# Patient Record
Sex: Male | Born: 1945 | Race: White | Hispanic: No | Smoking: Current every day smoker
Health system: Southern US, Community
[De-identification: ages and names within clinical notes are randomized; demographics above are authoritative.]

## PROBLEM LIST (undated history)

## (undated) DIAGNOSIS — L0390 Cellulitis, unspecified: Secondary | ICD-10-CM

## (undated) DIAGNOSIS — I714 Abdominal aortic aneurysm, without rupture, unspecified: Secondary | ICD-10-CM

## (undated) DIAGNOSIS — J449 Chronic obstructive pulmonary disease, unspecified: Secondary | ICD-10-CM

## (undated) DIAGNOSIS — K439 Ventral hernia without obstruction or gangrene: Secondary | ICD-10-CM

## (undated) DIAGNOSIS — K219 Gastro-esophageal reflux disease without esophagitis: Secondary | ICD-10-CM

## (undated) DIAGNOSIS — C321 Malignant neoplasm of supraglottis: Principal | ICD-10-CM

## (undated) DIAGNOSIS — C801 Malignant (primary) neoplasm, unspecified: Secondary | ICD-10-CM

## (undated) DIAGNOSIS — G5600 Carpal tunnel syndrome, unspecified upper limb: Secondary | ICD-10-CM

## (undated) DIAGNOSIS — F419 Anxiety disorder, unspecified: Secondary | ICD-10-CM

## (undated) DIAGNOSIS — I10 Essential (primary) hypertension: Secondary | ICD-10-CM

## (undated) DIAGNOSIS — E785 Hyperlipidemia, unspecified: Secondary | ICD-10-CM

## (undated) HISTORY — DX: Malignant neoplasm of supraglottis: C32.1

## (undated) HISTORY — DX: Chronic obstructive pulmonary disease, unspecified: J44.9

## (undated) HISTORY — DX: Abdominal aortic aneurysm, without rupture: I71.4

## (undated) HISTORY — DX: Cellulitis, unspecified: L03.90

## (undated) HISTORY — PX: APPENDECTOMY: SHX54

## (undated) HISTORY — DX: Abdominal aortic aneurysm, without rupture, unspecified: I71.40

## (undated) HISTORY — DX: Essential (primary) hypertension: I10

## (undated) HISTORY — DX: Hyperlipidemia, unspecified: E78.5

## (undated) HISTORY — DX: Carpal tunnel syndrome, unspecified upper limb: G56.00

## (undated) HISTORY — PX: HEMORROIDECTOMY: SUR656

## (undated) HISTORY — DX: Ventral hernia without obstruction or gangrene: K43.9

---

## 2006-09-26 HISTORY — PX: VASCULAR SURGERY: SHX849

## 2007-07-19 ENCOUNTER — Ambulatory Visit: Payer: Self-pay | Admitting: *Deleted

## 2007-07-30 ENCOUNTER — Ambulatory Visit (HOSPITAL_COMMUNITY): Admission: RE | Admit: 2007-07-30 | Discharge: 2007-07-30 | Payer: Self-pay | Admitting: *Deleted

## 2007-07-30 ENCOUNTER — Ambulatory Visit: Payer: Self-pay | Admitting: *Deleted

## 2007-09-05 ENCOUNTER — Inpatient Hospital Stay (HOSPITAL_COMMUNITY): Admission: RE | Admit: 2007-09-05 | Discharge: 2007-09-07 | Payer: Self-pay | Admitting: *Deleted

## 2007-09-05 ENCOUNTER — Ambulatory Visit: Payer: Self-pay | Admitting: *Deleted

## 2007-09-10 ENCOUNTER — Ambulatory Visit: Payer: Self-pay | Admitting: *Deleted

## 2007-10-11 ENCOUNTER — Ambulatory Visit: Payer: Self-pay | Admitting: *Deleted

## 2007-11-08 ENCOUNTER — Encounter: Admission: RE | Admit: 2007-11-08 | Discharge: 2007-11-08 | Payer: Self-pay | Admitting: *Deleted

## 2007-11-08 ENCOUNTER — Ambulatory Visit: Payer: Self-pay | Admitting: *Deleted

## 2007-11-15 ENCOUNTER — Ambulatory Visit: Payer: Self-pay | Admitting: *Deleted

## 2008-03-06 ENCOUNTER — Ambulatory Visit: Payer: Self-pay | Admitting: *Deleted

## 2008-05-05 ENCOUNTER — Encounter: Admission: RE | Admit: 2008-05-05 | Discharge: 2008-05-05 | Payer: Self-pay | Admitting: Neurosurgery

## 2008-06-12 ENCOUNTER — Encounter: Admission: RE | Admit: 2008-06-12 | Discharge: 2008-06-12 | Payer: Self-pay | Admitting: *Deleted

## 2008-06-12 ENCOUNTER — Ambulatory Visit: Payer: Self-pay | Admitting: *Deleted

## 2010-10-17 ENCOUNTER — Encounter: Payer: Self-pay | Admitting: *Deleted

## 2011-02-08 NOTE — Assessment & Plan Note (Signed)
OFFICE VISIT   Bruce [name], Bruce [name]  DOB:  11-26-1945                                       06/12/2008  CHART#:[id number]   The patient had an aortic stent placed for [name] small infrarenal aortic  aneurysm and [name] large right common iliac aneurysm on 09/05/2007 at [facility].  He did well following the surgery with the exception of  some claudication in his right buttock associated with coil embolization  of his right hypogastric artery.   These symptoms have been improving.   He underwent [name] CT scan at this time which reveals good position of the  stent without evidence of endoleak and thrombosis of the right common  iliac artery aneurysm.   The patient appears well.  No acute distress.  BP 133/87, pulse 102 per  minute and regular, respirations are 18 per minute.  His abdomen is  soft, nontender.  No masses or organomegaly.  Intact femoral pulses  bilaterally.   The patient continues to do well following the aortic stent procedure  and treatment of right iliac aneurysm.  Will plan followup again in 6  months with [name] repeat CT scan.   Balinda Quails, M.D.  Electronically Signed   PGH/MEDQ  D:  06/12/2008  T:  06/14/2008  Job:  [id number]   cc:   Doreen Beam, MD

## 2011-02-08 NOTE — Discharge Summary (Signed)
Bruce [name], [name]                ACCOUNT NO.:  [id number]   MEDICAL RECORD NO.:  [id number]          PATIENT TYPE:  INP   LOCATION:  2010                         FACILITY:  [facility]   PHYSICIAN:  Balinda Quails, M.D.    DATE OF BIRTH:  [birth year]   DATE OF ADMISSION:  09/05/2007  DATE OF DISCHARGE:  09/07/2007                               DISCHARGE SUMMARY   ADMISSION DIAGNOSES:  1. Aortic abdominal aneurysm.  2. Right common iliac artery aneurysm.   DISCHARGE DIAGNOSES:  1. Aortic abdominal aneurysm.  2. Right common iliac artery aneurysm.  3. Chronic obstructive pulmonary disease.  4. Epigastric hernia.  5. Hypertension.  6. Hyperlipidemia.  7. Recurrent cellulitis, right leg.  8. Carpal tunnel syndrome.   CONSULTATIONS:  None.   PROCEDURES:  1. Embolization of right hypogastric artery by Dr. Myra Gianotti IV.  2. Endologic power leak abdominal aortic aneurysm stent/graft done by      Dr. Madilyn Fireman.   BRIEF HISTORY AND PHYSICAL:  Bruce [name] is a 65 year old gentleman  referred for evaluation and management of an abdominal aortic aneurysm  and right common iliac artery aneurysm.  The patient was seen in  consultation in the office.  He denied symptoms.  No abdominal pain.  No  back pain.  He had recently undergone a workup including colonoscopy  revealing possible ischemic colitis.  CT angiogram revealed patency of  the superior mesenteric and inferior mesenteric arteries.  An abdominal  aortic aneurysm extending from the renal arteries down to the right  common iliac artery where there was a large 4.1-cm aneurysm.  Physical  examination while the patient was in the office was unremarkable.  The  patient was scheduled to undergo a stent/graft repair of a AAA and right  common iliac aneurysm for September 05, 2007.  This was discussed with  the patient and risks and procedure were also discussed with the  patient.  The patient understood and agreed to proceed and signed  consent.  The patient went through with the procedure on September 05, 2007, tolerated well with no complications.   HOSPITAL COURSE:  On September 05, 2007, the patient was taken to the OR  to proceed with procedure that had already been discussed with him and  scheduled electively.  The patient tolerated with no complications and  was transferred to PACU in stable condition.  From the PACU the patient  was then transferred to 3300 and remained stable overnight.  Post-op day #1, the patient's vitals were stable.  Labs within normal  range and afebrile.  The patient stated no complaints but not quite  ready for discharge that day.  On physical examination, the patient was  alert and oriented x3 in bed.  Femoral incisions bilaterally were clean  and dry with no hematoma and a plus 2 pulse distal.  Neurologically, the  patient was intact.  Plan at this time was to go ahead and discontinue  the Foley, central line, and intravenous fluids, normal saline  __________ .  The patient will be transferred to 2000 and start to  ambulate.  The patient agreed to this care.   On September 07, 2007, post-op day #2, the patient continued to remain  afebrile.  His vitals were stable.  There were no labs at this time.  After discussing with the patient he said he had no complaints and he  was ready to go home.   PHYSICAL EXAMINATION:  GENERAL:  The patient was alert and oriented x3.  HEART:  Normal rate and regular rhythm.  LUNGS:  Clear bilaterally.  ABDOMEN:  Soft and active bowel sounds. The patient also was eating and  tolerating food with no abdominal pain or nausea or vomiting.  NEUROLOGIC:  The patient was intact.  SKIN:  Bilateral femoral incisions were clean and dry with no hematoma.  No signs of infection and progressing towards healing.  Two positive  pedal pulses palpable.   We felt the patient could go home at this time and after discussion the  patient agreed that he was ready to go home.   The patient will be  discharged on September 07, 2007.   DISCHARGE MEDICATIONS:  1. Vytorin 10/20 daily.  2. Lotrel 5.10 daily.  3. Lorazepam 2 mg nightly.  4. Tylox 1-2 tablets orally every 6 hours as needed for pain.   DISCHARGE INSTRUCTIONS:  Discussed with the patient instructions and  followup care for discharge on September 07, 2007.  I explained to the  patient he may clean the wounds with soap and water.  I also discussed  with the patient to increase activity slowly, walk with assistance.  He  may shower and bathe.  No lifting or driving for 2 weeks.  Also, the  patient is not to return to work for 1 week.  I told the patient to  contact the office with any redness, drainage, swelling, abdominal pain,  or severe nausea vomiting.  I also instructed the patient to take the  meds per the reconciliation sheet along with Tylox 1-2 tablets orally  every four to six hours for pain as needed.  Also, discussed with the  patient he is to return to Dr. Madilyn Fireman in one month to have the  stent/graft  followup which includes a CT scan.  The patient has agreed  to these instructions and stated that he understood these instructions.   The plan is for the patient to be discharged to home today.  The patient  is currently afebrile and vitals are stable.      Cyndy Freeze, PA      P. Liliane Bade, M.D.  Electronically Signed    ALW/MEDQ  D:  09/07/2007  T:  09/07/2007  Job:  [id number]

## 2011-02-08 NOTE — Assessment & Plan Note (Signed)
OFFICE VISIT   Bruce [name], Bruce [name]  DOB:  Feb 05, 1946                                       10/11/2007  CHART#:[id number]   The patient underwent repair of abdominal aortic aneurysm and right  common iliac artery aneurysm with aortic stent graft carried out  09/05/2007 at [facility].  This required embolization of his  right hypogastric artery.   At this time, he is doing generally well.  He does note some right  buttock claudication, which is quite typical following this procedure.   BP is 121/78, pulse 112 per minute and regular, respirations 18 per  minute, O2 saturation 97%.  Right groin incision is well-healed.  Abdomen is soft and nontender.  2+ femoral pulses bilaterally.   The patient overall is doing well.  I have given him permission to  return to work as of 10/22/2007.  I reassured him that his right buttock  claudication will improve with time, and he should just stop and rest  when he feels this most significantly.   Balinda Quails, M.D.  Electronically Signed   PGH/MEDQ  D:  10/11/2007  T:  10/12/2007  Job:  [id number]   cc:   Doreen Beam, MD

## 2011-02-08 NOTE — Assessment & Plan Note (Signed)
OFFICE VISIT   SULEIMAN, FINIGAN A  DOB:  08-Jan-1946                                       11/08/2007  CHART#:[id number]   Patient is status post treatment of a large right common iliac artery  aneurysm with embolization of the right hypogastric artery and Endologix  Powerlink AAA stent graft on 09/05/07.  He has had complaints of right  buttock claudication following this.  He was seen as recently as  10/11/07 with this complaint.  Patient was reassured that he should  develop improvement with time.   He continues to have some discomfort, especially walking up inclines.  This is in his right buttock, relieved by rest.   PHYSICAL EXAMINATION:  BP is 118/72, pulse 112 per minute, respirations  18 per minute.  His abdominal exam is unremarkable.  He has 2+ femoral  pulses bilaterally.  No femoral bruits.  Right groin incision well  healed.   The plan at this time is to place him on some Pletal 100 mg b.i.d.  He  has no history of congestive heart failure.   Patient reassured once again and instructed to try a regular walking  program.  We will plan followup with his six month CT scan here in the  office.   Balinda Quails, M.D.  Electronically Signed   PGH/MEDQ  D:  11/08/2007  T:  11/10/2007  Job:  [id number]

## 2011-02-08 NOTE — Op Note (Signed)
NAMEJEREMIA, Bruce [name]                ACCOUNT NO.:  [id number]   MEDICAL RECORD NO.:  [id number]          PATIENT TYPE:  INP   LOCATION:  3311                         FACILITY:  [facility]   PHYSICIAN:  Juleen China IV, MDDATE OF BIRTH:  [birth year]   DATE OF PROCEDURE:  09/05/2007  DATE OF DISCHARGE:                               OPERATIVE REPORT   PREOPERATIVE DIAGNOSIS:  Abdominal and iliac aneurysm.   POSTOPERATIVE DIAGNOSIS:  Abdominal and iliac aneurysm.   PROCEDURES PERFORMED:  1. Ultrasound-guided access of the left common femoral artery.  2. Catheter in aorta.  3. Second-order cannulation (right hypogastric artery).  4. Coil of right hypogastric artery.   TYPE OF ANESTHESIA:  General.   BLOOD LOSS:  Minimal.   FINDINGS:  Successful coil embolization of the right hypogastric artery.   DRAINS:  None.   CULTURES:  None.   INDICATIONS:  This is a co-surgeon's dictation.  Please see Dr. Liliane Bade' dictation for the remaining portions of the procedure.   DESCRIPTION OF PROCEDURE:  The patient was identified in the holding  area and taken to room #9.  He was placed supine on the table.  General  endotracheal anesthesia was administered.  The patient was prepped and  draped in standard sterile fashion.  Time-out was called and  perioperative antibiotics were administered.  The left common femoral  artery was evaluated with ultrasound and found to be widely patent.  Using an ultrasound guidance, the left common femoral artery was then  accessed with an 18-gauge needle.  Next, a 0.035 wire was then advanced  in retrograde fashion into the abdominal aorta under fluoroscopic  visualization.  Next, a 9-French sheath was placed.  Using an Omni Flush  catheter and a Bentson wire, the aortic bifurcation was crossed with a  wire into the right common femoral artery.  The Omni Flux catheter was  removed and a 65-cm Kumpe catheter was placed into the right external  iliac artery.  It  was then withdrawn and the right hypogastric artery  was selected.  Bentson wire was used to advance the catheter into the  first branch of the right hypogastric artery.  The bifurcation of the  first branch was identified.  Two 10-mm Tornado coils were placed into  the right hypogastric artery.  A contrast injection was then performed,  which revealed successful coil embolization of the right hypogastric  artery.  At this point in time, please see Dr. Madilyn Fireman' note for the  remaining portions of the procedure.  Following successful endovascular  exclusion of the aneurysm, a retrograde right femoral artery injection  was performed, which delineated the location  of the arterial access.  This was found to be adequate for closure  device.  A StarClose was then deployed.  There was bleeding from the  closure site and therefore, manual pressure was held for 20 minutes.  There was no hematoma following removal of pressure.  The patient was  extubated and taken to the recovery room in stable condition.           ______________________________  Jorge Ny, MD  Electronically Signed     VWB/MEDQ  D:  09/05/2007  T:  09/05/2007  Job:  [id number]

## 2011-02-08 NOTE — Op Note (Signed)
[name], [name]                ACCOUNT NO.:  [id number]   MEDICAL RECORD NO.:  [id number]          PATIENT TYPE:  AMB   LOCATION:  SDS                          FACILITY:  [facility]   PHYSICIAN:  Balinda Quails, M.D.    DATE OF BIRTH:  July 02, 1946   DATE OF PROCEDURE:  07/30/2007  DATE OF DISCHARGE:                               OPERATIVE REPORT   DIAGNOSIS:  Right common iliac artery aneurysm.   PROCEDURE:  Abdominal aortogram with pelvic runoff arteriography.   ACCESS:  Right common femoral artery 5-French sheath.   CONTRAST:  80 mL of Visipaque.   COMPLICATIONS:  None apparent.   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  He is brought to the  cath lab at this time for further workup with diagnostic arteriography.   PROCEDURE NOTE:  The patient was brought to the cath lab in stable  condition.  Placed in the supine position.  Right groin prepped and  draped in a sterile fashion.  Skin and subcutaneous tissues instilled  with 1% Xylocaine.  A needle easily introduced into the right common  femoral artery.  A 0.035 guidewire passed through the needle into the  mid abdominal aorta.  A 5-French sheath advanced over the guidewire.  A  graduated pigtail catheter advanced over the guidewire to the mid  abdominal aorta.  Standard AP mid abdominal aortogram obtained.  This  revealed 2 right renal arteries and a single left renal artery.  All  renal arteries appeared to be widely patent.  The infrarenal aorta  revealed calcific plaque.  The aortic bifurcation also revealed  calcified plaque.  The left common iliac artery revealed mild plaque at  its origin.  The right common iliac artery was patent at its origin.  There appeared to be a small aneurysmal change in the proximal portion  of the right common iliac artery.  The midportion of  the right common  iliac artery revealed a saccular type aneurysm.  The hypogastric  arteries were patent bilaterally.  The external iliac artery was intact  down to the level of the common femoral arteries bilaterally.   A retrograde right femoral arteriogram was obtained for potential  placement of a closure device.  This however revealed the sheath to be  closely adjacent to the origin of bifid profunda.  Therefore, closure  device was not used.  The right femoral sheath was removed with pressure  on the right groin.   FINAL IMPRESSION:  1. Single left renal artery, 2 right renal arteries, all widely      patent.  2. 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  3. 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  4. Patent runoff to the common femoral arteries bilaterally.   DISPOSITION:  These results have been reviewed with the patient and  family.  He appears to be a candidate for potential stent-graft  placement for closure of right common iliac artery aneurysm.  This will  be further reviewed and planned as appropriate.      Balinda Quails, M.D.  Electronically Signed     PGH/MEDQ  D:  07/30/2007  T:  07/31/2007  Job:  [id number]

## 2011-02-08 NOTE — Assessment & Plan Note (Signed)
OFFICE VISIT   [name], [name] A  DOB:  [birth year]                                       11/15/2007  CHART#:[id number]   Patient return to the office today with a plain film result of his right  hip.  This reveals no major findings.   He was once again reassured today that the symptoms in his right hip  should improve as he continues to exercise.  I will plan followup as  scheduled in six months with a CT scan.   Balinda Quails, M.D.  Electronically Signed   PGH/MEDQ  D:  11/15/2007  T:  11/16/2007  Job:  [id number]

## 2011-02-08 NOTE — H&P (Signed)
NAMEGHASSAN, COGGESHALL                ACCOUNT NO.:  [id number]   MEDICAL RECORD NO.:  [id number]          PATIENT TYPE:  INP   LOCATION:  2550                         FACILITY:  [facility]   PHYSICIAN:  Balinda Quails, M.D.    DATE OF BIRTH:  [birth year]   DATE OF ADMISSION:  09/05/2007  DATE OF DISCHARGE:                              HISTORY & PHYSICAL   PRIMARY CARE PHYSICIAN:  Balinda Quails, M.D.   PRIMARY CARE PHYSICIAN:  Doreen Beam, MD, [facility].   ADMISSION DIAGNOSIS:  A 4.1 cm right common iliac artery aneurysm.   HISTORY:  Bruce [name] is a 65 year old gentleman referred for  evaluation and management of abdominal aortic aneurysm and right common  iliac artery aneurysm.  The patient was seen in consultation in the  office.  He denied symptoms.  No abdominal pain or back pain.   He had recently undergone workup including colonoscopy revealing  possible ischemic colitis.   CT angiogram revealed patency of the superior mesenteric and inferior  mesenteric arteries.  An abdominal aortic aneurysms was present  extending from the renal arteries down to the right common iliac artery  were there was a large 4.1 cm aneurysm.   PAST MEDICAL HISTORY:  1. COPD.  2. Epigastric hernia.  3. Hypertension.  4. Hyperlipidemia.  5. Recurrent cellulitis, right leg.  6. Carpal tunnel syndrome.   MEDICATIONS:  1. Vytorin 10/20 one tablet daily.  2. Lotrel 05/10 one tablet daily.  3. Lorazepam 1 mg nightly p.r.n.   ALLERGIES:  None known.   SOCIAL HISTORY:  The patient is married with two grown children.  He is  a Scientist, product/process development, currently unemployed.  Smokes five to six cigars  daily.  No alcohol use.   FAMILY HISTORY:  Mother died at age 17 of congestive heart failure.  Father died at age 24 with history of coronary disease, MI and stroke.  He has one brother deceased at age 44 of an MI.   REVIEW OF SYSTEMS:  The patient denies weight loss, anorexia, fever or  chills.   No cardiac, pulmonary, GI, GU, neurologic, musculoskeletal,  psychiatric, visual or hearing changes, no bleeding problems.   PHYSICAL EXAMINATION:  GENERAL:  A 65 year old male, alert and oriented.  No acute distress.  Appears stated age.  VITAL SIGNS:  Blood pressure 142/96, pulse 84 per minute, respirations  18 per minute, O2Sat 96%.  HEENT:  Mouth and throat clear.  Normocephalic.  Extraocular movements  intact.  Pupils equal and reactive.  NECK:  Supple.  No thyromegaly or adenopathy.  CARDIOVASCULAR:  No carotid bruits.  Normal heart sounds without  murmurs.  Regular rate and rhythm.  Intact femoral, popliteal, posterior  tibial, and dorsalis pedis pulses bilaterally.  No ankle edema.  RESPIRATORY:  Equal air entry bilaterally.  Distant breath sounds.  No  rales or rhonchi.  Normal percussion.  GI:  Abdomen soft and nontender.  No palpable AAA.  No organomegaly.  No  other masses felt.  Normal bowel sounds without bruits.  NEUROLOGIC:  The patient is alert and oriented.  No acute distress.  Cranial nerves intact.  Strength equal bilaterally.  MUSCULOSKELETAL:  Intact range of motion bilaterally.  No joint  deformity.  SKIN:  No rashes or ulcers.   IMPRESSION:  1. Small infrarenal abdominal aortic aneurysm, 4.1 cm right common      iliac artery aneurysm.  2. Hypertension.  3. Hyperlipidemia.  4. Chronic obstructive pulmonary disease.   RECOMMENDATION:  The patient will be scheduled to undergo a stent graft  repair of AAA and right common iliac aneurysm.      Balinda Quails, M.D.  Electronically Signed     PGH/MEDQ  D:  09/05/2007  T:  09/05/2007  Job:  [id number]   cc:   Doreen Beam

## 2011-02-08 NOTE — Op Note (Signed)
Bruce [name]                ACCOUNT NO.:  [id number]   MEDICAL RECORD NO.:  [id number]          PATIENT TYPE:  INP   LOCATION:  3311                         FACILITY:  [facility]   PHYSICIAN:  Balinda Quails, M.D.    DATE OF BIRTH:  1946/09/01   DATE OF PROCEDURE:  09/05/2007  DATE OF DISCHARGE:                               OPERATIVE REPORT   SURGEON:  Balinda Quails, M.D.   ASSISTANT:  1. Charlena Cross, M.D.   ANESTHESIA:  General endotracheal anesthesia.   ANESTHESIOLOGIST:  Janetta Hora. Gelene Mink, M.D.   PREOPERATIVE DIAGNOSIS:  1. 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  2. 3.7 cm infrarenal abdominal aortic aneurysm.   POSTOPERATIVE DIAGNOSIS:  1. 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  2. 3.7 cm infrarenal abdominal aortic aneurysm.   PROCEDURE:  1. Embolization of right hypogastric artery (dictated under separate      heading by Dr. Myra Gianotti).  2. Endologic Power Link abdominal aortic aneurysm stent graft.   FLUOROSCOPY:  5.39 minutes.   CONTRAST:  33 mL Visipaque.   COMPLICATIONS:  None apparent.   CLINICAL NOTE:  Bruce [name] is a 65 year old male with COPD and a large  right common iliac aneurysm greater than 4 cm.  A small infrarenal  abdominal aortic aneurysm.  Workup with CT scan and arteriography.  Felt  to be an adequate candidate for placement of an endovascular stent  graft.  Brought to the operating at this time for planned procedure.   Details of embolization of the right hypogastric artery are dictated  under separate heading by Dr. Myra Gianotti.   OPERATIVE PROCEDURE:  The patient was brought to the operating room in a  stable condition.  He was placed in a supine position.  General  endotracheal anesthesia was induced.  The abdomen was prepped and draped  in a sterile fashion.   An oblique skin incision was made through the right groin.  Dissection  was carried down through subcutaneous tissue with electrocautery.  The  right common femoral  artery exposed at the inguinal ligament.  Encircled  proximally with a vessel loop.  Distal dissection carried down to expose  the origin of the profunda and superficial femoral arteries which were  encircled with vessel loops.  The right common femoral artery was soft  and free of significant disease.  Accessed with an 18 gauge needle.  An  Amplatz super stiff guidewire then advanced through the needle, the  needle removed, and a 12 French tearaway sheath advanced over guidewire.  The sheath was positioned in the right external iliac artery.  The  Amplatz super stiff guidewire was removed.   The left common femoral artery was then accessed with an ultrasound  guided 18 gauge needle.  Common femoral artery accessed, 0.035 Benson  guidewire advanced through the needle into the mid abdominal aorta.  A 9  French sheath was advanced over the guidewire.  The patient was  administered 7000 units heparin intravenously.  Adequate circulation  time permitted.   The details of access and embolization of the right hypogastric artery  are dictated under a separate heading by Dr. Myra Gianotti.   Once the right hypogastric artery had been embolized, a guidewire was  then reinserted through the right femoral sheath.  A snare sheath  advanced over guidewire, the guidewire removed and the snare passed  through the sheath into the abdominal aorta.  The Bentson guidewire from  the left groin was then captured with the snare and brought through the  right groin.  A double lumen catheter was then advanced over the  guidewire from the left to the right and the second lumen was positioned  at the 11 o'clock position.  The Bentson guidewire was removed.   A bifurcated Endologic stent graft, 25 x 16 x 135, was then advanced  over the guidewire to the right common femoral artery.  The tearaway  sheath was then removed from the right common femoral artery.  The  contralateral limb was advanced through the second  lumen of the dual  lumen catheter and across into the left groin.  The dual lumen catheter  was then removed and the contralateral guidewire was withdrawn out into  the left common femoral artery.  The bifurcated device was then advanced  over the Amplatz super stiff guidewire to extend just above the level of  the bifurcation.  The device was then de-sheathed and the bifurcated  limbs of the device were freed and then the right limb re-sheathed.   The device was brought down onto the aortic bifurcation.  The sheath on  the main device was then extended and the device deployed and the body  of the device deployed into the abdominal aorta.  The contralateral limb  was then released and a 0.014 guidewire was advanced through the hollow  guidewire cannulating the contralateral limb.  A Kumpe catheter was then  advanced over guidewire and 0.014 guidewire exchanged for a Bentson  guidewire.  The right limb of the graft was then fully deployed and the  delivery device removed.  An extension limb on the right side was then  advanced over the guidewire, 16 x 88.  The 16 x 88 limb was then  deployed within the right limb of the graft and brought down across the  origin of the right hypogastric artery into the right external iliac  artery.  The deployment sheath was then removed and a 12 French sheath  advanced over the guidewire in the right groin.  Over the right access  wire was advanced a 26 coated balloon.  This was inflated in the body of  the graft and down into the right limb of the graft.  The contralateral  left limb was dilated with a 10 x 4 Powerflex balloon.   At completion of the balloon inflations, the balloons were removed  bilaterally.  An Omniflush catheter was advanced over the guidewire in  the left limb and up into the suprarenal position.  A completion  aortogram was obtained.  This revealed widely patent renal arteries  bilaterally.  No evidence of type 1 endoleak.   Excellent flow through  the stent without evidence of significant stenosis.  The right  hypogastric artery successfully immobilized.  The left hypogastric  artery patent.  Brisk flow noted through the external iliac arteries  bilaterally.   A retrograde injection of contrast was then made through the left  femoral sheath and verified this was in the common femoral artery.  The  sheath was removed and a device advanced over the guidewire.  The  Starclose dilator was removed and the guidewire removed.  The device was  advanced through the sheath and deployed in the left common femoral  artery.  There was, however, some continued bleeding.  Therefore,  pressure was placed on the left common femoral artery.  The right  femoral access wires and sheaths were removed.  The right femoral artery  flushed antegrade and retrograde.  The transverse arteriotomy was closed  with running 6-0 Prolene suture.  The clamps were then removed.  The  patient was administered 50 mg protamine intravenously.  Adequate  hemostasis was obtained.  The right groin incision was irrigated with  saline solution.  The subcutaneous tissue was closed with running 2-0  Vicryl suture in two layers.  The skin was closed with 4-0 Monocryl.  Dermabond was applied.  The patient tolerated the procedure well.  No  apparent complications.  He was transferred to the recovery room in  stable condition.      Balinda Quails, M.D.  Electronically Signed     PGH/MEDQ  D:  09/05/2007  T:  09/05/2007  Job:  [id number]   cc:   Doreen Beam

## 2011-02-08 NOTE — Assessment & Plan Note (Signed)
OFFICE VISIT   Bruce [name], Bruce [name] A  DOB:  July 20, 1946                                       03/06/2008  CHART#:[id number]   The patient returns today after undergoing a AAA and right iliac  aneurysm stent repair 09/05/2007 at [facility].  His previous  symptoms of right hip claudication have essentially resolved.   He does note now some discomfort in his lower back and hips with  ambulation.  He also feels some discomfort with standing.  He does have  some chronic low back pain.   Unfortunately, he did not undergo a CT scan today.   The patient appears generally well.  BP 122/77, pulse 125 per minute.  He is alert and oriented.  No acute distress.  His abdomen is soft,  nontender.  No masses or organomegaly.  Two plus femoral, popliteal,  posterior tibial, and dorsalis pedis pulses bilaterally.  No ankle  edema.   The patient is doing well following his AAA stent graft placed in  December of 2008.  I will plan to follow up with him in 3 months and at  that time obtain a CT scan.   I do think he does have some component of lumbar degenerative spine  disease and have asked that he follow up with Dr. Sherril Croon should he  continue to have symptoms for a potential neurosurgical opinion.   Balinda Quails, M.D.  Electronically Signed   PGH/MEDQ  D:  03/06/2008  T:  03/07/2008  Job:  [id number]   cc:   Doreen Beam, MD

## 2011-02-08 NOTE — Consult Note (Signed)
VASCULAR SURGERY CONSULTATION   [name], [name]  DOB:  1945/10/04                                       07/19/2007  CHART#:[id number]   REFERRAL DIAGNOSIS:  Abdominal aortic aneurysm and right common iliac  artery aneurysm.   HISTORY:  Patient is seen in consultation today for evaluation of  aneurysmal disease of the abdominal aorta and right common iliac artery.  He was recently in [name] at the end of August with rectal  bleeding.  Workup for this carried out by Dr. Lionel [name], including  colonoscopy revealing possible ischemic colitis, single pedunculated  polyp, diverticular disease, and post-hemorrhoidectomy scarring.   A CT angiogram of the abdomen was carried out to rule out ischemic  colitis.  Revealed patency of the superior mesenteric and internal  mammary arteries.  A suprarenal abdominal aortic aneurysm was present,  extending from the superior mesenteric artery to just below the renal  arteries.  This measures 3.2 cm in maximal diameter.  A right common  iliac artery aneurysm is present, measuring 4.1 cm in maximal diameter.   Patient denies abdominal pain.  He has had no further rectal bleeding.   PAST MEDICAL HISTORY:  1. COPD.  2. Epigastric hernia.  3. Hypertension.  4. Hyperlipidemia.  5. History of recurrent cellulitis, right leg.  6. Carpal tunnel.   MEDICATIONS:  1. Vytorin 10/20 1 tablet daily.  2. Lotrel 5/10 1 tablet daily.  3. Lorazepam 2 mg 1/2 tablet nightly p.r.n.   ALLERGIES:  None known.   SOCIAL HISTORY:  Patient is married with two grown children.  He is a  Scientist, product/process development, currently unemployed.  He smokes about 5-6 cigars daily.  No alcohol use.   FAMILY HISTORY:  Mother died at age 69 of congestive heart failure.  Father died at age 81 with a history of coronary artery disease, MI, and  stroke.  He has one brother, deceased at age 33 of an MI.   REVIEW OF SYSTEMS:  Denies weight loss, anorexia, fevers  or chills.  Negative for cardiac, pulmonary, GI, GU, neurologic, musculoskeletal,  psychiatric, vision or hearing changes, or bleeding problems.  (Refer to  patient encounter form).   PHYSICAL EXAMINATION:  A 65 year old male appears generally well, no  acute distress.  Appears stated age.  Alert and oriented.  Vital signs:  BP 132/82, pulse 76 per minute, respirations 18 per minute.  HEENT:  Extraocular movements are intact.  Pupils are equal and reactive.  No  scleral icterus.  Mouth and throat are clear.  Neck supple.  No  thyromegaly or adenopathy.  Cardiovascular:  No carotid bruits.  Heart  sounds are normal.  No murmurs.  Intact femoral, popliteal, posterior  tibial, and dorsalis pedis pulses bilaterally.  Ankle edema on the  right.  Respiratory:  Air entry equal bilaterally.  Distant breath  sounds.  No rales or rhonchi.  Normal percussion.  GI:  Abdomen is soft  and nontender.  No pulsatile masses.  No organomegaly or other masses.  Normal bowel sounds without bruits.  Neurologic:  Patient is alert and  oriented.  Cranial nerves are intact. Strength is equal bilaterally.  Normal reflexes at 1+.  Musculoskeletal:  Intact range of motion  bilaterally.   IMPRESSION:  1. Small suprarenal abdominal aortic aneurysm measuring 3.2 cm.  2. Large right common iliac  artery aneurysm measuring 4.1 cm.  3. Hypertension.  4. Hyperlipidemia.  5. Chronic obstructive pulmonary disease.   MEDICAL DECISION MAKING:  Patient has a significant right common iliac  artery aneurysm which is enlarged to the point of potential rupture  risk. We will obtain a CD from [name]  Radiology Department to further evaluate this aneurysm by images.  Plan  to refer to cardiology for a preoperative assessment.  Continue current  evaluation for potential stent graft placement.   Bruce [name], M.D.  Electronically Signed  PGH/MEDQ  D:  07/19/2007  T:  07/21/2007  Job:  [id number]   cc:   Bruce [name]

## 2011-06-01 ENCOUNTER — Other Ambulatory Visit: Payer: Self-pay | Admitting: Vascular Surgery

## 2011-06-01 DIAGNOSIS — I714 Abdominal aortic aneurysm, without rupture: Secondary | ICD-10-CM

## 2011-06-16 ENCOUNTER — Encounter: Payer: Self-pay | Admitting: Vascular Surgery

## 2011-06-16 ENCOUNTER — Other Ambulatory Visit: Payer: Self-pay | Admitting: Vascular Surgery

## 2011-06-16 LAB — CREATININE, SERUM: Creat: 0.83 mg/dL (ref 0.50–1.35)

## 2011-06-16 LAB — BUN: BUN: 12 mg/dL (ref 6–23)

## 2011-06-20 ENCOUNTER — Encounter: Payer: Self-pay | Admitting: Vascular Surgery

## 2011-06-21 ENCOUNTER — Ambulatory Visit
Admission: RE | Admit: 2011-06-21 | Discharge: 2011-06-21 | Disposition: A | Discharge status: Home / Self Care | Payer: Medicare Other | Source: Ambulatory Visit | Attending: Vascular Surgery | Admitting: Vascular Surgery

## 2011-06-21 ENCOUNTER — Ambulatory Visit (INDEPENDENT_AMBULATORY_CARE_PROVIDER_SITE_OTHER): Payer: Medicare Other | Admitting: Vascular Surgery

## 2011-06-21 ENCOUNTER — Encounter: Payer: Self-pay | Admitting: Vascular Surgery

## 2011-06-21 VITALS — BP 151/84 | HR 109 | Resp 20 | Ht 70.0 in | Wt 165.0 lb

## 2011-06-21 DIAGNOSIS — I714 Abdominal aortic aneurysm, without rupture: Secondary | ICD-10-CM

## 2011-06-21 DIAGNOSIS — I7092 Chronic total occlusion of artery of the extremities: Secondary | ICD-10-CM

## 2011-06-21 MED ORDER — IOHEXOL 350 MG/ML SOLN
100.0000 mL | Freq: Once | INTRAVENOUS | Status: AC | PRN
  Administered 2011-06-21: 100 mL via INTRAVENOUS

## 2011-06-21 NOTE — Progress Notes (Signed)
Addended by: Sharee Pimple on: 06/21/2011 03:16 PM   Modules accepted: Orders

## 2011-06-21 NOTE — Progress Notes (Signed)
The patient presents today for followup of the stent graft repair of right common iliac artery aneurysm. This was done by Dr. Madilyn Fireman in December of 2008. He had a followup CT scan in September of 2009 and had been lost to followup since that time. He reports today with bilateral claudication symptoms. He reports no resting discomfort but does have limiting claudication in both legs throughout both legs. Reports this as fatigue. He also had an area of ulceration over the tip of his right great toe and had a toenail removal and this is healing. He does not have any specific arterial rest pain.  Past Medical History  Diagnosis Date  . Hyperlipidemia   . Hypertension   . COPD (chronic obstructive pulmonary disease)   . AAA (abdominal aortic aneurysm)   . Carpal tunnel syndrome   . Epigastric hernia   . Cellulitis     History  Substance Use Topics  . Smoking status: Current Everyday Smoker -- 1.0 packs/day for 12 years    Types: Cigars  . Smokeless tobacco: Never Used  . Alcohol Use: 8.4 oz/week    14 Cans of beer per week    Family History  Problem Relation Age of Onset  . Heart failure Mother   . Coronary artery disease Father   . Stroke Father   . Heart disease Brother     MI    No Known Allergies  Current outpatient prescriptions:AMLODIPINE BESYLATE PO, Take by mouth daily.  , Disp: , Rfl: ;  LORazepam (ATIVAN) 1 MG tablet, Take 1 mg by mouth Nightly.  , Disp: , Rfl: ;  CILOSTAZOL PO, Take by mouth 2 (two) times daily.  , Disp: , Rfl: ;  DICLOFENAC POTASSIUM PO, Take by mouth.  , Disp: , Rfl: ;  ezetimibe-simvastatin (VYTORIN) 10-20 MG per tablet, Take 1 tablet by mouth at bedtime.  , Disp: , Rfl:  No current facility-administered medications for this visit. Facility-Administered Medications Ordered in Other Visits: iohexol (OMNIPAQUE) 350 MG/ML injection 100 mL, 100 mL, Intravenous, Once PRN, Medication Radiologist, 100 mL at 06/21/11 0858  BP 151/84  Pulse 109  Resp 20  Ht  5\' 10"  (1.778 m)  Wt 165 lb (74.844 kg)  BMI 23.68 kg/m2  Body mass index is 23.68 kg/(m^2).       Review of systems is negative except history of present illness. He denies any cardiac difficulties.  Physical exam: Well-developed well-nourished white male in no acute distress, HEENT normal, carotid arteries without bruits bilaterally, 2+ radial pulses bilaterally, heart regular rate and rhythm, chest clear bilaterally without rales or rhonchi. Abdomen soft nontender no aneurysm palpated. Absent femoral and distal pulses. Musculoskeletal no major deformity. Skin without ulcers or rashes. Healing tip of his right great toe.  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 Since this study in 2009 he has had occlusion of the right limb of the stent graft and occlusion of his native left external iliac artery. He has reconstitution of his common femoral arteries bilaterally  Impression and plan: Bilateral iliac artery occlusions with claudication. I discussed this at length with the patient and his wife present. I explained that he would require open surgical repair if the claudication is intolerable. I do not feel that he is in the risk of rupture from the standpoint of his current CT scan. I discussed the critical need  for ongoing surveillance of the stent graft and the area of aneurysmal detail dilatation at the renal arteries. We are to see him again in one year with repeat CT scan for followup. He'll notify us if his claudication becomes intolerable or he develops progressive tissue loss.

## 2011-07-04 LAB — COMPREHENSIVE METABOLIC PANEL
ALT: 10
AST: 16
Albumin: 3.6
Alkaline Phosphatase: 91
BUN: 6
CO2: 22
CO2: 28
Calcium: 7.9 — ABNORMAL LOW
Creatinine, Ser: 0.67
GFR calc Af Amer: >60
GFR calc Af Amer: >60
GFR calc non Af Amer: >60
Glucose, Bld: 110 — ABNORMAL HIGH
Potassium: 3.7
Sodium: 137
Total Protein: 5.6 — ABNORMAL LOW
Total Protein: 7.1

## 2011-07-04 LAB — CBC
HCT: 36.1 — ABNORMAL LOW
HCT: 44.5
Hemoglobin: 12.4 — ABNORMAL LOW
Hemoglobin: 15.7
MCHC: 34.3
MCHC: 34.6
MCHC: 35.2
MCV: 91.2
MCV: 91.7
RBC: 3.81 — ABNORMAL LOW
RBC: 3.87 — ABNORMAL LOW
RDW: 15.8 — ABNORMAL HIGH
RDW: 16.2 — ABNORMAL HIGH

## 2011-07-04 LAB — PROTIME-INR: INR: 0.9

## 2011-07-04 LAB — BASIC METABOLIC PANEL
BUN: 4 — ABNORMAL LOW
Calcium: 8.4
Creatinine, Ser: 0.69
GFR calc Af Amer: >60
GFR calc non Af Amer: >60
Glucose, Bld: 88
Potassium: 3.2 — ABNORMAL LOW

## 2011-07-04 LAB — BLOOD GAS, ARTERIAL
Drawn by: 1811601
TCO2: 25.5
pCO2 arterial: 34 — ABNORMAL LOW
pH, Arterial: 7.47 — ABNORMAL HIGH

## 2011-07-04 LAB — URINALYSIS, ROUTINE W REFLEX MICROSCOPIC
Bilirubin Urine: NEGATIVE
Glucose, UA: NEGATIVE
Hgb urine dipstick: NEGATIVE
Ketones, ur: NEGATIVE
Specific Gravity, Urine: 1.022

## 2011-07-04 LAB — TYPE AND SCREEN

## 2011-07-05 LAB — BASIC METABOLIC PANEL
CO2: 29
Chloride: 103
GFR calc Af Amer: >60
Potassium: 3.6

## 2011-07-05 LAB — CBC
HCT: 40.1
Hemoglobin: 13.8
MCHC: 34.5
MCV: 92.5
RBC: 4.34
WBC: 10.1

## 2011-09-29 DIAGNOSIS — Z720 Tobacco use: Secondary | ICD-10-CM | POA: Insufficient documentation

## 2011-09-29 DIAGNOSIS — I10 Essential (primary) hypertension: Secondary | ICD-10-CM | POA: Insufficient documentation

## 2012-06-19 ENCOUNTER — Ambulatory Visit: Payer: Medicare Other | Admitting: Vascular Surgery

## 2012-06-19 ENCOUNTER — Other Ambulatory Visit: Payer: Medicare Other

## 2013-06-09 DIAGNOSIS — J449 Chronic obstructive pulmonary disease, unspecified: Secondary | ICD-10-CM | POA: Insufficient documentation

## 2016-02-01 ENCOUNTER — Other Ambulatory Visit: Payer: Self-pay | Admitting: Vascular Surgery

## 2016-02-01 DIAGNOSIS — I739 Peripheral vascular disease, unspecified: Secondary | ICD-10-CM

## 2016-02-16 ENCOUNTER — Ambulatory Visit (HOSPITAL_COMMUNITY)
Admission: RE | Admit: 2016-02-16 | Discharge: 2016-02-16 | Disposition: A | Discharge status: Home / Self Care | Payer: Medicare Other | Source: Ambulatory Visit | Attending: Vascular Surgery | Admitting: Vascular Surgery

## 2016-02-16 ENCOUNTER — Ambulatory Visit: Payer: Medicare Other | Admitting: Vascular Surgery

## 2016-02-16 DIAGNOSIS — I739 Peripheral vascular disease, unspecified: Secondary | ICD-10-CM | POA: Insufficient documentation

## 2016-02-17 ENCOUNTER — Encounter: Payer: Self-pay | Admitting: Vascular Surgery

## 2016-02-18 ENCOUNTER — Encounter (HOSPITAL_COMMUNITY): Payer: Medicare Other

## 2016-02-24 ENCOUNTER — Other Ambulatory Visit: Payer: Self-pay | Admitting: *Deleted

## 2016-02-24 ENCOUNTER — Ambulatory Visit (INDEPENDENT_AMBULATORY_CARE_PROVIDER_SITE_OTHER): Payer: Medicare Other | Admitting: Vascular Surgery

## 2016-02-24 ENCOUNTER — Telehealth: Payer: Self-pay | Admitting: Vascular Surgery

## 2016-02-24 ENCOUNTER — Encounter: Payer: Self-pay | Admitting: Vascular Surgery

## 2016-02-24 VITALS — BP 134/84 | HR 79 | Ht 70.0 in | Wt 173.4 lb

## 2016-02-24 DIAGNOSIS — I714 Abdominal aortic aneurysm, without rupture, unspecified: Secondary | ICD-10-CM

## 2016-02-24 DIAGNOSIS — I716 Thoracoabdominal aortic aneurysm, without rupture, unspecified: Secondary | ICD-10-CM

## 2016-02-24 NOTE — Telephone Encounter (Signed)
Spoke with pt. CTA being done at [facility] 03/02/16 at 10:15 am. No solids 4 hrs prior. Labs drawn there.  Pt verbalized understanding. TFE to call pt after.

## 2016-02-24 NOTE — Progress Notes (Signed)
[facility] [place]  Patient name: Bruce [name] MRN: [id number] DOB: [birth year] Sex: male  REASON FOR CONSULT: Evaluation lower extremity claudication  HPI: Bruce [name] is a 70 y.o. male, who is seen today for discussion of lower extremity claudication and follow-up of stent graft repair of right common iliac artery aneurysm. This was done by Dr. Amedeo Plenty in 2008. He had an aortobiiliac device placed for a right common iliac artery aneurysm. He failed to follow-up in our office. I saw him in 2012 with CT scan at that time showing complete occlusion of his common iliac artery stent and also occlusion of his left native external iliac artery. He did have intolerable non-limb threatening ischemia with claudication which was stable. He was found on the CT scan at that time to have aneurysm of his juxtarenal aorta and I recommended that we see him in a year for follow-up. He continued to not follow-up in our office and is seen today for discussion of lower extremity flow. He does report a fairly typical thigh and calf claudication in his had no tissue loss.  Past Medical History  Diagnosis Date  . Hyperlipidemia   . Hypertension   . COPD (chronic obstructive pulmonary disease) ([place])   . AAA (abdominal aortic aneurysm) ([place])   . Carpal tunnel syndrome   . Epigastric hernia   . Cellulitis     Family History  Problem Relation Age of Onset  . Heart failure Mother   . Coronary artery disease Father   . Stroke Father   . Heart disease Brother     MI    SOCIAL HISTORY: Social History   Social History  . Marital Status: Widowed    Spouse Name: N/A  . Number of Children: N/A  . Years of Education: N/A   Occupational History  . Not on file.   Social History Main Topics  . Smoking status: Current Every Day Smoker -- 1.00 packs/day for 12 years    Types: Cigars  . Smokeless tobacco: Never Used  . Alcohol Use: 8.4 oz/week    14 Cans of beer per week  . Drug  Use: No  . Sexual Activity: Not on file   Other Topics Concern  . Not on file   Social History Narrative    No Known Allergies  Current Outpatient Prescriptions  Medication Sig Dispense Refill  . atorvastatin (LIPITOR) 10 MG tablet Take 10 mg by mouth daily.  12  . LORazepam (ATIVAN) 1 MG tablet Take 1 mg by mouth Nightly.      . pantoprazole (PROTONIX) 40 MG tablet Take 40 mg by mouth 2 (two) times daily.  12  . traMADol (ULTRAM) 50 MG tablet Take 50 mg by mouth every 6 (six) hours as needed.    Marland Kitchen AMLODIPINE BESYLATE PO Take by mouth daily. Reported on 02/24/2016    . CILOSTAZOL PO Take by mouth 2 (two) times daily. Reported on 02/24/2016    . DICLOFENAC POTASSIUM PO Take by mouth. Reported on 02/24/2016    . ezetimibe-simvastatin (VYTORIN) 10-20 MG per tablet Take 1 tablet by mouth at bedtime. Reported on 02/24/2016     No current facility-administered medications for this visit.    REVIEW OF SYSTEMS:  [X]  denotes positive finding, [ ]  denotes negative finding Cardiac  Comments:  Chest pain or chest pressure:    Shortness of breath upon exertion:    Short of breath when lying flat:    Irregular heart rhythm:  Vascular    Pain in calf, thigh, or hip brought on by ambulation: x   Pain in feet at night that wakes you up from your sleep:     Blood clot in your veins:    Leg swelling:         Pulmonary    Oxygen at home:    Productive cough:     Wheezing:         Neurologic    Sudden weakness in arms or legs:     Sudden numbness in arms or legs:     Sudden onset of difficulty speaking or slurred speech:    Temporary loss of vision in one eye:     Problems with dizziness:         Gastrointestinal    Blood in stool:     Vomited blood:         Genitourinary    Burning when urinating:     Blood in urine:        Psychiatric    Major depression:         Hematologic    Bleeding problems:    Problems with blood clotting too easily:        Skin    Rashes or  ulcers:        Constitutional    Fever or chills:      PHYSICAL EXAM: Filed Vitals:   02/24/16 0904  BP: 134/84  Pulse: 79  Height: 5\' 10"  (1.778 m)  Weight: 173 lb 6.4 oz (78.654 kg)  SpO2: 96%    GENERAL: The patient is a well-nourished male, in no acute distress. The vital signs are documented above. CARDIAC: There is a regular rate and rhythm.  VASCULAR: 2+ radial pulses bilaterally. Absent femoral pulses bilaterally PULMONARY: There is good air exchange bilaterally without wheezing or rales. ABDOMEN: Soft and non-tender with normal pitched bowel sounds. No aneurysm palpable MUSCULOSKELETAL: There are no major deformities or cyanosis. NEUROLOGIC: No focal weakness or paresthesias are detected. SKIN: There are no ulcers or rashes noted. PSYCHIATRIC: The patient has a normal affect.  Partially noninvasive studies today reveal stable ankle index at 0.53 on the right and 0.73 on the left  MEDICAL ISSUES: Again had long discussion with patient regarding his lower extremity arterial insufficiency. This is stable and would not recommend any further treatment. Would require aortobifemoral bypass for correction of this and he is not extended in pursuing this magnitude of surgery. Also concern regarding his suprarenal aneurysm. This is not been evaluated since 2012. Recommended CT scan of his chest abdomen and pelvis for further evaluation of his diffuse aneurysmal disease. We will schedule this at his earliest convenience and discussed findings with him following the  x-ray.   Curt Jews [facility] [employer] [phone]

## 2016-03-02 ENCOUNTER — Ambulatory Visit (HOSPITAL_COMMUNITY)
Admission: RE | Admit: 2016-03-02 | Discharge: 2016-03-02 | Disposition: A | Discharge status: Home / Self Care | Payer: Medicare Other | Source: Ambulatory Visit | Attending: Vascular Surgery | Admitting: Vascular Surgery

## 2016-03-02 DIAGNOSIS — I716 Thoracoabdominal aortic aneurysm, without rupture, unspecified: Secondary | ICD-10-CM

## 2016-03-02 DIAGNOSIS — I714 Abdominal aortic aneurysm, without rupture, unspecified: Secondary | ICD-10-CM

## 2016-03-02 DIAGNOSIS — I745 Embolism and thrombosis of iliac artery: Secondary | ICD-10-CM | POA: Diagnosis not present

## 2016-03-02 DIAGNOSIS — I700 Atherosclerosis of aorta: Secondary | ICD-10-CM | POA: Insufficient documentation

## 2016-03-02 DIAGNOSIS — R911 Solitary pulmonary nodule: Secondary | ICD-10-CM | POA: Insufficient documentation

## 2016-03-02 LAB — POCT I-STAT CREATININE: CREATININE: 1.1 mg/dL (ref 0.61–1.24)

## 2016-03-02 MED ORDER — IOPAMIDOL (ISOVUE-370) INJECTION 76%
100.0000 mL | Freq: Once | INTRAVENOUS | Status: AC | PRN
  Administered 2016-03-02: 100 mL via INTRAVENOUS

## 2016-03-03 ENCOUNTER — Encounter: Payer: Self-pay | Admitting: Vascular Surgery

## 2016-03-03 ENCOUNTER — Telehealth (HOSPITAL_COMMUNITY): Payer: Self-pay

## 2016-03-03 NOTE — Progress Notes (Unsigned)
Patient ID: Bruce [name], male   DOB: 21-Jun-1946, 70 y.o.   MRN: [id number] Patient hadCT scan of his chest and abdomen after my office visit on 02/24/2016. This showed stable thoracic 4.1 cm aneurysm and stable 3.7 juxtarenal aneurysm. Has known occlusion of the right limb of his aorta iliac stent graft in the occlusion of his left external iliac which is been present since CT on 2012. This will be communicated to the patient. Recommend repeat evaluation in 2 years with CT scan

## 2016-03-07 NOTE — Progress Notes (Signed)
Contacted pt. by phone.  Advised that Dr. Donnetta Hutching had reviewed the CTA chest, abdomen, and pelvis of 03/02/16.  Informed pt. that Dr. Donnetta Hutching reported "the aneurysm above the old aneurysm repair is stable"; nothing was noted to be of concern at this time.  Advised a 2 yr. F/u  CTA of Chest/ Abd./ Pelvis, and office appt. is recommended, and that the pt. will be notified of appts at that time.  Denied any questions at present.  Verb. Understanding.

## 2016-05-09 ENCOUNTER — Ambulatory Visit (INDEPENDENT_AMBULATORY_CARE_PROVIDER_SITE_OTHER): Payer: Medicare Other | Admitting: Otolaryngology

## 2016-05-09 DIAGNOSIS — D380 Neoplasm of uncertain behavior of larynx: Secondary | ICD-10-CM | POA: Diagnosis not present

## 2016-05-09 DIAGNOSIS — R490 Dysphonia: Secondary | ICD-10-CM

## 2016-05-10 ENCOUNTER — Encounter (HOSPITAL_BASED_OUTPATIENT_CLINIC_OR_DEPARTMENT_OTHER): Payer: Self-pay | Admitting: *Deleted

## 2016-05-10 ENCOUNTER — Other Ambulatory Visit: Payer: Self-pay | Admitting: Otolaryngology

## 2016-05-16 ENCOUNTER — Ambulatory Visit (HOSPITAL_BASED_OUTPATIENT_CLINIC_OR_DEPARTMENT_OTHER)
Admission: RE | Admit: 2016-05-16 | Discharge: 2016-05-16 | Disposition: A | Discharge status: Home / Self Care | Payer: Medicare Other | Source: Ambulatory Visit | Attending: Otolaryngology | Admitting: Otolaryngology

## 2016-05-16 ENCOUNTER — Ambulatory Visit (HOSPITAL_BASED_OUTPATIENT_CLINIC_OR_DEPARTMENT_OTHER): Payer: Medicare Other | Admitting: Anesthesiology

## 2016-05-16 ENCOUNTER — Encounter (HOSPITAL_BASED_OUTPATIENT_CLINIC_OR_DEPARTMENT_OTHER): Payer: Self-pay | Admitting: *Deleted

## 2016-05-16 ENCOUNTER — Encounter (HOSPITAL_BASED_OUTPATIENT_CLINIC_OR_DEPARTMENT_OTHER)
Admission: RE | Disposition: A | Discharge status: Home / Self Care | Payer: Self-pay | Source: Ambulatory Visit | Attending: Otolaryngology

## 2016-05-16 DIAGNOSIS — R490 Dysphonia: Secondary | ICD-10-CM | POA: Diagnosis not present

## 2016-05-16 DIAGNOSIS — D380 Neoplasm of uncertain behavior of larynx: Secondary | ICD-10-CM | POA: Diagnosis not present

## 2016-05-16 DIAGNOSIS — F17210 Nicotine dependence, cigarettes, uncomplicated: Secondary | ICD-10-CM | POA: Insufficient documentation

## 2016-05-16 DIAGNOSIS — C321 Malignant neoplasm of supraglottis: Secondary | ICD-10-CM | POA: Diagnosis not present

## 2016-05-16 DIAGNOSIS — J449 Chronic obstructive pulmonary disease, unspecified: Secondary | ICD-10-CM | POA: Diagnosis not present

## 2016-05-16 DIAGNOSIS — F17290 Nicotine dependence, other tobacco product, uncomplicated: Secondary | ICD-10-CM | POA: Insufficient documentation

## 2016-05-16 DIAGNOSIS — I10 Essential (primary) hypertension: Secondary | ICD-10-CM | POA: Diagnosis not present

## 2016-05-16 DIAGNOSIS — I719 Aortic aneurysm of unspecified site, without rupture: Secondary | ICD-10-CM | POA: Diagnosis not present

## 2016-05-16 HISTORY — DX: Gastro-esophageal reflux disease without esophagitis: K21.9

## 2016-05-16 HISTORY — PX: DIRECT LARYNGOSCOPY: SHX5326

## 2016-05-16 HISTORY — DX: Anxiety disorder, unspecified: F41.9

## 2016-05-16 SURGERY — LARYNGOSCOPY, DIRECT
Anesthesia: General | Site: Throat

## 2016-05-16 MED ORDER — LACTATED RINGERS IV SOLN
INTRAVENOUS | Status: DC
  Administered 2016-05-16: 09:00:00 via INTRAVENOUS

## 2016-05-16 MED ORDER — FENTANYL CITRATE (PF) 100 MCG/2ML IJ SOLN
50.0000 ug | INTRAMUSCULAR | Status: DC | PRN
  Administered 2016-05-16: 50 ug via INTRAVENOUS

## 2016-05-16 MED ORDER — FENTANYL CITRATE (PF) 100 MCG/2ML IJ SOLN
INTRAMUSCULAR | Status: AC
  Filled 2016-05-16: qty 2

## 2016-05-16 MED ORDER — PROPOFOL 10 MG/ML IV BOLUS
INTRAVENOUS | Status: DC | PRN
  Administered 2016-05-16: 150 mg via INTRAVENOUS
  Administered 2016-05-16: 100 mg via INTRAVENOUS

## 2016-05-16 MED ORDER — LIDOCAINE HCL (CARDIAC) 20 MG/ML IV SOLN
INTRAVENOUS | Status: DC | PRN
  Administered 2016-05-16: 50 mg via INTRAVENOUS

## 2016-05-16 MED ORDER — EPINEPHRINE HCL 1 MG/ML IJ SOLN
INTRAMUSCULAR | Status: AC
  Filled 2016-05-16: qty 1

## 2016-05-16 MED ORDER — ONDANSETRON HCL 4 MG/2ML IJ SOLN: 4.0000 mg | Freq: Once | INTRAMUSCULAR | Status: DC | PRN

## 2016-05-16 MED ORDER — SCOPOLAMINE 1 MG/3DAYS TD PT72: 1.0000 | MEDICATED_PATCH | Freq: Once | TRANSDERMAL | Status: DC | PRN

## 2016-05-16 MED ORDER — OXYCODONE HCL 5 MG/5ML PO SOLN: 5.0000 mg | Freq: Once | ORAL | Status: DC | PRN

## 2016-05-16 MED ORDER — OXYCODONE HCL 5 MG PO TABS: 5.0000 mg | ORAL_TABLET | Freq: Once | ORAL | Status: DC | PRN

## 2016-05-16 MED ORDER — HYDROMORPHONE HCL 1 MG/ML IJ SOLN: 0.2500 mg | INTRAMUSCULAR | Status: DC | PRN

## 2016-05-16 MED ORDER — ONDANSETRON HCL 4 MG/2ML IJ SOLN
INTRAMUSCULAR | Status: DC | PRN
  Administered 2016-05-16: 4 mg via INTRAVENOUS

## 2016-05-16 MED ORDER — SUGAMMADEX SODIUM 500 MG/5ML IV SOLN
INTRAVENOUS | Status: DC | PRN
  Administered 2016-05-16: 320 mg via INTRAVENOUS

## 2016-05-16 MED ORDER — PROPOFOL 10 MG/ML IV BOLUS
INTRAVENOUS | Status: AC
  Filled 2016-05-16: qty 20

## 2016-05-16 MED ORDER — MIDAZOLAM HCL 2 MG/2ML IJ SOLN
1.0000 mg | INTRAMUSCULAR | Status: DC | PRN
Start: 2016-05-16 — End: 2016-05-16

## 2016-05-16 MED ORDER — DEXAMETHASONE SODIUM PHOSPHATE 4 MG/ML IJ SOLN
INTRAMUSCULAR | Status: DC | PRN
  Administered 2016-05-16: 10 mg via INTRAVENOUS

## 2016-05-16 MED ORDER — GLYCOPYRROLATE 0.2 MG/ML IJ SOLN: 0.2000 mg | Freq: Once | INTRAMUSCULAR | Status: DC | PRN

## 2016-05-16 MED ORDER — OXYCODONE-ACETAMINOPHEN 5-325 MG PO TABS: 1.0000 | ORAL_TABLET | ORAL | 0 refills | Status: DC | PRN

## 2016-05-16 MED ORDER — EPINEPHRINE HCL 1 MG/ML IJ SOLN
INTRAMUSCULAR | Status: DC | PRN
  Administered 2016-05-16: 2 mL

## 2016-05-16 SURGICAL SUPPLY — 26 items
ADAPTER TUBE FLEX ULTRASET (MISCELLANEOUS) ×3 IMPLANT
CANISTER SUCT 1200ML W/VALVE (MISCELLANEOUS) ×3 IMPLANT
GLOVE BIO SURGEON STRL SZ7.5 (GLOVE) ×3 IMPLANT
GLOVE SURG SS PI 7.0 STRL IVOR (GLOVE) ×2 IMPLANT
GOWN STRL REUS W/ TWL LRG LVL3 (GOWN DISPOSABLE) IMPLANT
GOWN STRL REUS W/TWL LRG LVL3 (GOWN DISPOSABLE) ×3
GUARD TEETH (MISCELLANEOUS) IMPLANT
MARKER SKIN DUAL TIP RULER LAB (MISCELLANEOUS) IMPLANT
NDL HYPO 18GX1.5 BLUNT FILL (NEEDLE) ×1 IMPLANT
NDL SPNL 22GX7 QUINCKE BK (NEEDLE) IMPLANT
NDL SPNL 25GX3.5 QUINCKE BL (NEEDLE) ×1 IMPLANT
NEEDLE HYPO 18GX1.5 BLUNT FILL (NEEDLE) IMPLANT
NEEDLE SPNL 22GX7 QUINCKE BK (NEEDLE) IMPLANT
NEEDLE SPNL 25GX3.5 QUINCKE BL (NEEDLE) ×3 IMPLANT
NS IRRIG 1000ML POUR BTL (IV SOLUTION) ×3 IMPLANT
PATTIES SURGICAL .5 X3 (DISPOSABLE) ×3 IMPLANT
SHEET MEDIUM DRAPE 40X70 STRL (DRAPES) ×3 IMPLANT
SLEEVE SCD COMPRESS KNEE MED (MISCELLANEOUS) IMPLANT
SOLUTION BUTLER CLEAR DIP (MISCELLANEOUS) ×1 IMPLANT
SPONGE GAUZE 4X4 12PLY STER LF (GAUZE/BANDAGES/DRESSINGS) ×4 IMPLANT
SURGILUBE 2OZ TUBE FLIPTOP (MISCELLANEOUS) IMPLANT
SYR CONTROL 10ML LL (SYRINGE) IMPLANT
SYR TB 1ML LL NO SAFETY (SYRINGE) ×1 IMPLANT
TOWEL OR 17X24 6PK STRL BLUE (TOWEL DISPOSABLE) ×3 IMPLANT
TUBE CONNECTING 20'X1/4 (TUBING) ×1
TUBE CONNECTING 20X1/4 (TUBING) ×2 IMPLANT

## 2016-05-16 NOTE — Op Note (Signed)
DATE OF PROCEDURE:  05/16/2016                              OPERATIVE REPORT  SURGEON:  Leta Baptist, MD  PREOPERATIVE DIAGNOSES: 1. Hoarseness 2. Left aryepiglottic fold mass  POSTOPERATIVE DIAGNOSES: 1. Hoarseness 2. 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  PROCEDURE PERFORMED: Microdirect laryngoscopy and biopsy  ANESTHESIA:  General endotracheal tube anesthesia.  COMPLICATIONS:  None.  ESTIMATED BLOOD LOSS:  46ml  INDICATION FOR PROCEDURE:  Bruce [name] is a 70 y.o. male with a recent history all hoarseness and bilateral neck masses. On examination, he was noted to have a left aryepiglottic fold mass. The appearance was worrisome for malignancy.  Based on the above findings, the decision was made for the patient to undergo the micro-direct laryngoscopy and biopsy procedure.  The risks, benefits, alternatives, and details of the procedure were discussed with the mother.  Questions were invited and answered.  Informed consent was obtained.  DESCRIPTION:  The patient was taken to the operating room and placed supine on the operating table.  General endotracheal tube anesthesia was administered by the anesthesiologist.  The patient was positioned and prepped and draped in a standard fashion for direct laryngoscopy. A Dedo laryngoscope was used for examination. The laryngoscope was inserted via the oral cavity into the pharynx. No pharyngeal lesion was noted. However, a soft tissue mass was noted on the left aryepiglottic fold, extending posteriorly onto the arytenoid mucosa bilaterally. Biopsy specimens were obtained from bilateral aryepiglottic folds and arytenoid mucosa. Hemostasis was achieved with pledgets soaked with epinephrine. Both vocal cords were noted to be symmetric and free of lesion. The piriform sinuses were normal.   The care of the patient was turned over to the anesthesiologist.  The patient was awakened from anesthesia without difficulty.   He was extubated and transferred to the recovery room in good condition.  OPERATIVE FINDINGS: Laryngeal mass, involving the left aryepiglottic folds, extending onto the posterior arytenoid mucosa bilaterally.  SPECIMEN:  Bilateral laryngeal biopsy specimens.  FOLLOWUP CARE:  The patient will be discharged home once awake and alert.  He will be placed on percocet prn painl.  The patient will follow up in my office in approximately 1 week.  [name] 05/16/2016 12:03 PM

## 2016-05-16 NOTE — Anesthesia Postprocedure Evaluation (Signed)
Anesthesia Post Note  Patient: Bruce [name]  Procedure(s) Performed: Procedure(s) (LRB): MICRO DIRECT LARYNGOSCOPY WITH BIOPSY (N/A)  Patient location during evaluation: PACU Anesthesia Type: General Level of consciousness: awake, awake and alert and oriented Pain management: pain level controlled Vital Signs Assessment: post-procedure vital signs reviewed and stable Respiratory status: spontaneous breathing, nonlabored ventilation and respiratory function stable Cardiovascular status: blood pressure returned to baseline Anesthetic complications: no    Last Vitals:  Vitals:   05/16/16 1230 05/16/16 1245  BP:  135/82  Pulse: 66 65  Resp: (!) 22 13  Temp:      Last Pain:  Vitals:   05/16/16 1245  TempSrc:   PainSc: 0-No pain                 Melvenia Favela COKER

## 2016-05-16 NOTE — Discharge Instructions (Addendum)
°  Post Anesthesia Home Care Instructions  Activity: Get plenty of rest for the remainder of the day. A responsible adult should stay with you for 24 hours following the procedure.  For the next 24 hours, DO NOT: -Drive a car -Paediatric nurse -Drink alcoholic beverages -Take any medication unless instructed by your physician -Make any legal decisions or sign important papers.  Meals: Start with liquid foods such as gelatin or soup. Progress to regular foods as tolerated. Avoid greasy, spicy, heavy foods. If nausea and/or vomiting occur, drink only clear liquids until the nausea and/or vomiting subsides. Call your physician if vomiting continues.  Special Instructions/Symptoms: Your throat may feel dry or sore from the anesthesia or the breathing tube placed in your throat during surgery. If this causes discomfort, gargle with warm salt water. The discomfort should disappear within 24 hours.  If you had a scopolamine patch placed behind your ear for the management of post- operative nausea and/or vomiting:  1. The medication in the patch is effective for 72 hours, after which it should be removed.  Wrap patch in a tissue and discard in the trash. Wash hands thoroughly with soap and water. 2. You may remove the patch earlier than 72 hours if you experience unpleasant side effects which may include dry mouth, dizziness or visual disturbances. 3. Avoid touching the patch. Wash your hands with soap and water after contact with the patch.     The patient may resume all his previous activities and diet. He will follow-up in my office in one week.

## 2016-05-16 NOTE — Anesthesia Procedure Notes (Signed)
Procedure Name: Intubation Date/Time: 05/16/2016 11:46 AM Performed by: Melynda Ripple D Pre-anesthesia Checklist: Patient identified, Emergency Drugs available, Suction available and Patient being monitored Patient Re-evaluated:Patient Re-evaluated prior to inductionOxygen Delivery Method: Circle system utilized Preoxygenation: Pre-oxygenation with 100% oxygen Intubation Type: IV induction Ventilation: Mask ventilation without difficulty Tube type: Oral Tube size: 6.0 mm Number of attempts: 1 Airway Equipment and Method: Stylet and Oral airway Placement Confirmation: ETT inserted through vocal cords under direct vision,  positive ETCO2 and breath sounds checked- equal and bilateral Secured at: 22 cm Tube secured with: Tape Dental Injury: Teeth and Oropharynx as per pre-operative assessment

## 2016-05-16 NOTE — Anesthesia Preprocedure Evaluation (Addendum)
Anesthesia Evaluation  Patient identified by MRN, date of birth, ID band Patient awake    Reviewed: Allergy & Precautions, NPO status , Patient's Chart, lab work & pertinent test results  Airway Mallampati: II  TM Distance: >3 FB     Dental  (+) Edentulous Upper, Edentulous Lower   Pulmonary Current Smoker,    breath sounds clear to auscultation       Cardiovascular hypertension,  Rhythm:Regular Rate:Normal     Neuro/Psych    GI/Hepatic   Endo/Other    Renal/GU      Musculoskeletal   Abdominal   Peds  Hematology   Anesthesia Other Findings   Reproductive/Obstetrics                             Anesthesia Physical Anesthesia Plan  ASA: III  Anesthesia Plan: General   Post-op Pain Management:    Induction: Intravenous  Airway Management Planned: Oral ETT  Additional Equipment:   Intra-op Plan:   Post-operative Plan: Extubation in OR  Informed Consent: I have reviewed the patients History and Physical, chart, labs and discussed the procedure including the risks, benefits and alternatives for the proposed anesthesia with the patient or authorized representative who has indicated his/her understanding and acceptance.     Plan Discussed with: CRNA and Anesthesiologist  Anesthesia Plan Comments:         Anesthesia Quick Evaluation

## 2016-05-16 NOTE — H&P (Signed)
Cc: Hoarseness  HPI: The patient is a 70 y/o male who presents today for evaluation of neck swelling and hoarseness. The patient is seen in consultation requested by Dr. Jerene Bears. The patient first noticed a knot along the left side of his neck in early February. Since that time, he has noted throat discomfort, hoarseness, and now a knot has formed on the right side of his neck. The patient denies any dysphagia or odynophagia. He is currently taking pantoprazole bid without any change in his symptoms. He has been smoking 5 cigars daily for the past 20 years. Previous ENT surgery is denied.   The patient's review of systems (constitutional, eyes, ENT, cardiovascular, respiratory, GI, musculoskeletal, skin, neurologic, psychiatric, endocrine, hematologic, allergic) is noted in the ROS questionnaire.  It is reviewed with the patient.   Family health history: CVA, heart disease, CHF.   Major events: Right iliac stent.   Ongoing medical problems: Epigastric hernia, aortic aneurysm, hypertension, COPD, .   Social history: The patient is a widow. He smokes 5 cigarettes a day. He denies the use of alcohol or illegal drugs.  Exam General: Communicates without difficulty, well nourished, no acute distress. Head: Normocephalic, no evidence injury, no tenderness, facial buttresses intact without stepoff. Eyes: PERRL, EOMI.  No scleral icterus, conjunctivae clear. Ears: External auditory canals clear bilaterally.  There is no edema or erythema.  Tympanic membrane is within normal limits bilaterally. Nose: Normal skin and external support.  Anterior rhinoscopy reveals healthy pink mucosa over the septum and turbinates.  No lesions or polyps were seen. Oral cavity: Lips without lesions, oral mucosa moist, no masses or lesions seen. Indirect  mirror laryngoscopy could not be tolerated. Pharynx: Clear, no erythema. Neck: Supple, full range of motion, no lymphadenopathy, no masses palpable. Salivary: Parotid and  submandibular glands without mass. Neuro:  CN 2-12 grossly intact. Gait normal. Vestibular: No nystagmus at any point of gaze.   Procedure:  Flexible Fiberoptic Laryngoscopy -- Risks, benefits, and alternatives of flexible endoscopy were explained to the patient.  Specific mention was made of the risk of throat numbness with difficulty swallowing, possible bleeding from the nose and mouth, and pain from the procedure.  The patient gave oral consent to proceed.  The nasal cavities were decongested and anesthetised with a combination of oxymetazoline and 4% lidocaine solution.  The flexible scope was inserted into the right nasal cavity and advanced towards the nasopharynx.  Visualized mucosa over the turbinates and septum were as described above.  The nasopharynx was clear.  Oropharyngeal walls were symmetric and mobile without lesion, mass, or edema.  Hypopharynx was also without  lesion or edema.  A left aryepiglottic fold mass is noted.  True vocal folds were white and mobile.  Base of tongue was within normal limits.  The patient tolerated the procedure well.   Assessment 1.  A  left aryepiglottic fold mass is noted. 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 Both vocal cords are mobile.   Plan  1.  The laryngoscopy findings are reviewed with the patient at length.  2.   Recommend direct laryngoscopy with biopsy. The risks, benefits, alternatives, and details of the procedure are reviewed with the patient. Questions are invited and answered. 3.  The procedure will be scheduled as soon as possible. The patient will follow up after the biopsy.

## 2016-05-16 NOTE — Transfer of Care (Signed)
Immediate Anesthesia Transfer of Care Note  Patient: Bruce [name]  Procedure(s) Performed: Procedure(s): MICRO DIRECT LARYNGOSCOPY WITH BIOPSY (N/A)  Patient Location: PACU  Anesthesia Type:General  Level of Consciousness: awake, alert  and oriented  Airway & Oxygen Therapy: Patient Spontanous Breathing and Patient connected to face mask oxygen  Post-op Assessment: Report given to RN and Post -op Vital signs reviewed and stable  Post vital signs: Reviewed and stable  Last Vitals:  Vitals:   05/16/16 0900  BP: (!) 146/70  Pulse: 75  Resp: 18  Temp: 36.6 C    Last Pain:  Vitals:   05/16/16 0900  TempSrc: Oral         Complications: No apparent anesthesia complications

## 2016-05-17 MED ORDER — ROCURONIUM BROMIDE 100 MG/10ML IV SOLN
INTRAVENOUS | Status: DC | PRN
  Administered 2016-05-16: 40 mg via INTRAVENOUS

## 2016-05-17 NOTE — Addendum Note (Signed)
Addendum  created 05/17/16 1303 by Tawni Millers, CRNA   Anesthesia Intra Meds edited, Charge Capture section accepted

## 2016-05-18 ENCOUNTER — Encounter (HOSPITAL_BASED_OUTPATIENT_CLINIC_OR_DEPARTMENT_OTHER): Payer: Self-pay | Admitting: Otolaryngology

## 2016-05-23 ENCOUNTER — Ambulatory Visit (INDEPENDENT_AMBULATORY_CARE_PROVIDER_SITE_OTHER): Payer: Medicare Other | Admitting: Otolaryngology

## 2016-05-23 DIAGNOSIS — C321 Malignant neoplasm of supraglottis: Secondary | ICD-10-CM | POA: Diagnosis not present

## 2016-06-09 ENCOUNTER — Encounter (HOSPITAL_COMMUNITY): Payer: Medicare Other | Attending: Hematology & Oncology | Admitting: Hematology & Oncology

## 2016-06-09 ENCOUNTER — Encounter (HOSPITAL_COMMUNITY): Payer: Self-pay | Admitting: Hematology & Oncology

## 2016-06-09 VITALS — BP 121/62 | HR 89 | Temp 98.7°F | Resp 16 | Ht 68.0 in | Wt 168.4 lb

## 2016-06-09 DIAGNOSIS — C321 Malignant neoplasm of supraglottis: Secondary | ICD-10-CM | POA: Diagnosis not present

## 2016-06-09 DIAGNOSIS — G893 Neoplasm related pain (acute) (chronic): Secondary | ICD-10-CM | POA: Diagnosis not present

## 2016-06-09 DIAGNOSIS — Z720 Tobacco use: Secondary | ICD-10-CM

## 2016-06-09 MED ORDER — HYDROCODONE-ACETAMINOPHEN 10-325 MG PO TABS: 1.0000 | ORAL_TABLET | ORAL | 0 refills | Status: DC | PRN

## 2016-06-09 MED ORDER — HYDROCODONE-ACETAMINOPHEN 10-300 MG PO TABS: 1.0000 | ORAL_TABLET | ORAL | 0 refills | Status: DC | PRN

## 2016-06-09 NOTE — Progress Notes (Signed)
[facility] NOTE  Patient Care Team: [name], MD as PCP - General (Internal Medicine) Cristal Deer, DPM (Podiatry)  CHIEF COMPLAINTS/PURPOSE OF CONSULTATION:  Bilateral laryngeal carcinoma  HISTORY OF PRESENTING ILLNESS:  Bruce [name] 70 y.o. male is here for a consult due to bilateral laryngeal carcinoma.   In February, patient noticed his throat was feeling sore. He forgot to mention it to his PCP, Dr. Woody Seller, until July. Dr. Woody Seller then recommended he see a HEENT specialist, he was referred to Dr. Benjamine Mola. Per available records the patient noted a "knot" on the L side of his neck in early February then progressive throat discomfort, hoarseness, and a "knot" forming on his R neck. He has had no odynphagia or dysphagia. He smokes cigars and has done so for over 20 years. No chewing tobacco, no cigarettes.   Patient was seen by Dr. Benjamine Mola on 05/09/2016. He underwent a flexible fiberoptic laryngoscopy on 8/14, a L aryepiglottic fold mass was noted. Direct laryngoscopy with biopsy was recommended. Pathology report notes that biopsies of both the R and L aryepiglottic folds were taken and showed invasive poorly differentiated carcinoma.   Patient says his appetite is normal, although he has a lost a couple of pounds over the last few months. He takes Tramadol, but says it only alleviates pain temporarily.  He reports fatigue and difficulty walking since he had a stent put in his right leg. He notes no change in his sleeping. No nausea. No headaches.    MEDICAL HISTORY:  Past Medical History:  Diagnosis Date  . AAA (abdominal aortic aneurysm) ([place])   . Anxiety   . Carpal tunnel syndrome   . Cellulitis   . COPD (chronic obstructive pulmonary disease) ([place])   . Epigastric hernia   . GERD (gastroesophageal reflux disease)   . Hyperlipidemia   . Hypertension     used to take amlodipine, none now    SURGICAL HISTORY: Past Surgical History:  Procedure  Laterality Date  . DIRECT LARYNGOSCOPY N/A 05/16/2016   Procedure: MICRO DIRECT LARYNGOSCOPY WITH BIOPSY;  Surgeon: Leta Baptist, MD;  Location: [facility];  Service: ENT;  Laterality: N/A;  . HEMORROIDECTOMY    . VASCULAR SURGERY Right 2008   stent to right common iliac artery    SOCIAL HISTORY: Social History   Social History  . Marital status: Widowed    Spouse name: N/A  . Number of children: N/A  . Years of education: N/A   Occupational History  . Not on file.   Social History Main Topics  . Smoking status: Current Every Day Smoker    Packs/day: 0.00    Years: 12.00    Types: Cigars  . Smokeless tobacco: Never Used     Comment: 5-7 cigars daily  . Alcohol use No  . Drug use: No  . Sexual activity: Not Currently   Other Topics Concern  . Not on file   Social History Narrative  . No narrative on file  Patient lives alone.  He has 2 daughters; one lives close to him and one lives in [place]. He has four grandchildren.  He smokes about 5 cigars day. He used to smoke cigarettes and chew tobacco when he was young. 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  FAMILY HISTORY: Family History  Problem Relation Age of Onset  . Heart failure Mother   . Coronary artery disease Father   .  Stroke Father   . Alcoholism Father   . Heart disease Brother     MI  4 sisters; brother died from massive heart attack  Mother deceased at 68  Father deceased at 50 from massive heart attack.  ALLERGIES:  has No Known Allergies.  MEDICATIONS:  Current Outpatient Prescriptions  Medication Sig Dispense Refill  . albuterol (PROVENTIL HFA;VENTOLIN HFA) 108 (90 Base) MCG/ACT inhaler Inhale 1 puff into the lungs every 6 (six) hours as needed for wheezing or shortness of breath.    Marland Kitchen atorvastatin (LIPITOR) 10 MG tablet Take 10 mg by mouth daily.  12  . Choline Fenofibrate (TRILIPIX) 135 MG capsule Take by mouth.    Marland Kitchen LORazepam (ATIVAN) 1  MG tablet Take 1 mg by mouth Nightly.      Marland Kitchen oxyCODONE-acetaminophen (PERCOCET/ROXICET) 5-325 MG tablet     . pantoprazole (PROTONIX) 40 MG tablet Take 40 mg by mouth 2 (two) times daily.  12  . traMADol (ULTRAM) 50 MG tablet Take 50 mg by mouth every 6 (six) hours as needed.     No current facility-administered medications for this visit.     Review of Systems  Constitutional: Positive for malaise/fatigue and weight loss.       Only a couple of a pounds  HENT: Positive for sore throat.        Hoarseness   Eyes: Negative.   Respiratory: Negative.   Cardiovascular: Negative.   Gastrointestinal: Negative.   Genitourinary: Negative.   Musculoskeletal: Negative.        Difficulty walking  Skin: Negative.   Neurological: Negative.   Endo/Heme/Allergies: Negative.   Psychiatric/Behavioral: Negative.   All other systems reviewed and are negative. 14 point ROS was done and is otherwise as detailed above or in HPI   PHYSICAL EXAMINATION: ECOG PERFORMANCE STATUS: 1 - Symptomatic but completely ambulatory  Vitals:   06/09/16 1437  BP: 121/62  Pulse: 89  Resp: 16  Temp: 98.7 F (37.1 C)   Filed Weights   06/09/16 1437  Weight: 168 lb 6.4 oz (76.4 kg)     Physical Exam  Constitutional: He is oriented to person, place, and time and well-developed, well-nourished, and in no distress.  HENT:  Head: Normocephalic and atraumatic.  Nose: Nose normal.  Mouth/Throat: Oropharynx is clear and moist. No oropharyngeal exudate.  Eyes: Conjunctivae and EOM are normal. Pupils are equal, round, and reactive to light. Right eye exhibits no discharge. Left eye exhibits no discharge. No scleral icterus.  Neck: Normal range of motion. Neck supple. No tracheal deviation present. No thyromegaly present.  Bilateral palpable neck adenopathy    Cardiovascular: Normal rate, regular rhythm and normal heart sounds.  Exam reveals no gallop and no friction rub.   No murmur heard. Pulmonary/Chest:  Effort normal and breath sounds normal. He has no wheezes. He has no rales.  Abdominal: Soft. Bowel sounds are normal. He exhibits no distension and no mass. There is no tenderness. There is no rebound and no guarding.  Musculoskeletal: Normal range of motion. He exhibits no edema.  Lymphadenopathy:    He has cervical adenopathy.    He has no axillary adenopathy.  Neurological: He is alert and oriented to person, place, and time. He has normal reflexes. No cranial nerve deficit. Gait normal. Coordination normal.  Skin: Skin is warm and dry. No rash noted.  Psychiatric: Mood, memory, affect and judgment normal.  Nursing note and vitals reviewed.    LABORATORY DATA:  I have reviewed the data  as listed Lab Results  Component Value Date   WBC 9.8 09/06/2007   HGB 12.1 (L) 09/06/2007   HCT 34.9 (L) 09/06/2007   MCV 91.7 09/06/2007   PLT 175 09/06/2007   CMP     Component Value Date/Time   NA 137 09/06/2007 0445   K 3.0 (L) 09/06/2007 0445   CL 106 09/06/2007 0445   CO2 28 09/06/2007 0445   GLUCOSE 110 (H) 09/06/2007 0445   BUN 12 06/16/2011 1155   CREATININE 1.10 03/02/2016 1032   CREATININE 0.83 06/16/2011 1155   CALCIUM 7.9 (L) 09/06/2007 0445   PROT 5.6 (L) 09/06/2007 0445   ALBUMIN 2.7 (L) 09/06/2007 0445   AST 13 09/06/2007 0445   ALT <8 09/06/2007 0445   ALKPHOS 57 09/06/2007 0445   BILITOT 0.6 09/06/2007 0445   GFRNONAA >60 09/06/2007 0445   GFRAA  09/06/2007 0445    >60        The eGFR has been calculated using the MDRD equation. This calculation has not been validated in all clinical     RADIOGRAPHIC STUDIES: I have personally reviewed the radiological images as listed and agreed with the findings in the report. No results found.  Study Result   CLINICAL DATA:  Thoracoabdominal aortic aneurysm, post stent placement 5 years ago. Asymptomatic.  EXAM: CT ANGIOGRAPHY CHEST, ABDOMEN, PELVIS WITH CONTRAST  TECHNIQUE: Multidetector CT imaging of the  chest, abdomen, pelvis was performed using the standard protocol during bolus administration of intravenous contrast. Multiplanar CT image reconstructions and MIPs were obtained to evaluate the vascular anatomy.  CONTRAST:  100 mL Isovue 370 IV  COMPARISON:  12/28/2015 and previous  FINDINGS: CHEST  Vascular: Right arm IV contrast administration. SVC patent. Right atrium nondilated. Fair contrast opacification of pulmonary artery and branches; the exam was not optimized for detection of pulmonary emboli. Patent pulmonary veins drain into the left atrium. Scattered coronary calcifications. Good contrast opacification of the thoracic aorta. Ascending aorta normal in caliber. No dissection or stenosis. Moderate partially calcified atheromatous plaque through the arch and descending segment. Patent brachiocephalic arterial origin anatomy. Left vertebral artery arises directly from the abdominal aorta, an anatomic variant. There some intraluminal thrombus in the left subclavian artery approximately 2.5 cm from its origin, stable since 04/14/2015. Penetrating atheromatous ulcer in the mid descending segment with focal fusiform dilatation up to 4.1 cm diameter (stable since 04/14/2015) and in the distal descending segment a second saccular aneurysmal lesion 3.4 cm diameter, stable.  Mediastinum/Lymph Nodes: Subcentimeter prevascular, AP window, and right paratracheal nodes are slightly more prominent than on prior exam. Prominent bilateral hilar lymph nodes measured up to 1 cm short axis diameter. No pericardial effusion.  Lungs/Pleura: Advanced emphysematous changes. Subpleural fibrosis bilaterally. No focal airspace disease. 11 mm nodule adjacent to the minor fissure image 96/11, stable since 04/14/2015, present but incompletely visualized 06/19/2009. 4 mm subpleural nodule superior segment left lower lobe image 99, stable. No new nodule or infiltrate. No pleural effusion. No  pneumothorax.  Musculoskeletal: Mild T11 compression deformity, stable. Spondylitic changes in the visualized lower cervical spine.  ABDOMEN  Arterial  Aorta: Heavy calcified atheromatous plaque throughout. Penetrating atheromatous ulcer with intramural hematoma versus short segment dissection involving the posterior wall from the level of the SMA origin to below the renal arteries, with aneurysmal dilatation up to 3.7 cm AP diameter (previously 3.5 cm on 06/21/2011). Bifurcated infrarenal stent graft, right limb occluded as before.  Celiac axis:  Patent  Superior mesenteric:  Patent  Left renal:           Single, patent  Right renal:          Duplicated, Codominant, both widely patent  Inferior mesenteric: Short segment origin occlusion, reconstituted distally by visceral collaterals.  Left iliac: Left limb of the aortic stent extends to the distal common iliac, widely patent. Calcified plaque in the distal native common iliac. Origin occlusion of the left external iliac artery, extending throughout its length. Scattered plaque in the internal iliac which remains patent. Collateral reconstitution of the common femoral artery, with high-grade short-segment stenosis just above its bifurcation. Proximal SFA is atheromatous but patent.  Right iliac: The occluded right limb of the aortic stent graft extends to the mid external iliac. There is continued occlusion of common iliac aneurysm a, 2.8 cm diameter (previously 3.1). Previous coil embolization of the internal iliac artery. The occlusion of the stent extends through the length of the native external iliac. Collateral reconstitution of common femoral artery. Proximal SFA is patent.  Venous  Patent hepatic veins, portal vein, SMV, splenic vein, bilateral renal veins, IVC, and iliac venous systems.  Review of the MIP images confirms the above findings.  Nonvasular  Hepatobiliary: No masses or  other significant abnormality.  Pancreas: No mass, inflammatory changes, or other significant abnormality.  Spleen: Within normal limits in size and appearance. Calcified granuloma.  Adrenals/Urinary Tract: No masses identified. No evidence of hydronephrosis. Exophytic 2.4 cm cyst from upper pole right kidney. No nephrolithiasis.  Stomach/Bowel: No evidence of obstruction, inflammatory process, or abnormal fluid collections. Scattered sigmoid diverticula.  Lymphatic: No pathologically enlarged lymph nodes.  Reproductive: No mass or other significant abnormality.  Other: No ascites.  No free air.  Musculoskeletal: Mild spondylitic changes L2-S1. Negative for fracture or suspicious lesion.  IMPRESSION: 1. 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 2. 11 mm anterior right upper lobe nodule stable since 03/2015. 3. 3.7 cm proximal abdominal aortic aneurysm, previously 3.5 cm on 06/21/2011. 4. 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 5. Slight decrease in size of excluded right common iliac aneurysm. 6. 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   Electronically Signed   By: Lucrezia Europe M.D.   On: 03/02/2016 12:54   PATHOLOGY:    ASSESSMENT & PLAN:  Invasive squamous cell carcinoma of larynx Bilateral cervical adenopathy Tobacco use Peripheral Vascular disease Cancer related pain  We discussed head and neck cancer in a general sense. I discussed additional imaging with PET/CT and the rationale behind this. We discussed the need to discontinue his cigar use. He notes that he does understand.  I discussed with the patient that based upon his PE I anticipate he  will need concurrent chemoradiation therapy. In preparation for treatment, he would need  to see dietitian, speech and language therapist and physical therapy along with chemotherapy education class. He has dentures, he will not need a referral to Dr. Enrique Sack.  I discussed with the patient the rationale of placing port and feeding tube up front before treatment.  He will meet Anderson [place] our navigator today.  I will see him back post PET for additional planning. Pain medication was refilled. We did discuss the role of long acting pain medications. Marland Kitchen  All questions were answered. The patient knows to call the clinic with any problems, questions or concerns.  ORDERS PLACED FOR THIS ENCOUNTER: Orders Placed This Encounter  Procedures  . NM PET Image Initial (PI) Skull Base To Thigh   This document serves as a record of services personally performed by Ancil Linsey, MD. It was created on her behalf by Elmyra Ricks, a trained medical scribe. The creation of this record is based on the scribe's personal observations and the provider's statements to them. This document has been checked and approved by the attending provider.  I have reviewed the above documentation for accuracy and completeness, and I agree with the above.  This note was electronically signed.    Molli Hazard, MD  06/09/2016 2:49 PM

## 2016-06-09 NOTE — Patient Instructions (Addendum)
[facility] [place] at [facility] Discharge Instructions  RECOMMENDATIONS MADE BY THE CONSULTANT AND ANY TEST RESULTS WILL BE SENT TO YOUR REFERRING PHYSICIAN.  You saw Dr. Whitney Muse today. You will be scheduled for a PET scan. You will be referred to Dr. Isidore Moos at [facility] after PET scan completed.  Follow up with Dr. Whitney Muse after PET scan.   Thank you for choosing [place] at [facility] to provide your oncology and hematology care.  To afford each patient quality time with our provider, please arrive at least 15 minutes before your scheduled appointment time.   Beginning January 23rd 2017 lab work for the [facility] will be done in the  Main lab at [facility] on 1st floor. If you have a lab appointment with the [facility] please come in thru the  Main Entrance and check in at the main information desk  You need to re-schedule your appointment should you arrive 10 or more minutes late.  We strive to give you quality time with our providers, and arriving late affects you and other patients whose appointments are after yours.  Also, if you no show three or more times for appointments you may be dismissed from the clinic at the providers discretion.     Again, thank you for choosing [facility].  Our hope is that these requests will decrease the amount of time that you wait before being seen by our physicians.       _____________________________________________________________  Should you have questions after your visit to [facility], please contact our office at [phone] between the hours of 8:30 a.m. and 4:30 p.m.  Voicemails left after 4:30 p.m. will not be returned until the following business day.  For prescription refill requests, have your pharmacy contact our office.         Resources For Cancer Patients and their Caregivers ? American Cancer Society: Can assist with transportation, wigs, general  needs, runs Look Good Feel Better.        773-705-1522 ? Cancer Care: Provides financial assistance, online support groups, medication/co-pay assistance.  1-800-813-HOPE 9862414397) ? [facility] Assists [place] cancer patients and their families through emotional , educational and financial support.  585-781-7283 ? [place] DSS Where to apply for food stamps, Medicaid and utility assistance. 463-787-6464 ? [facility]: Transportation to medical appointments. (770)299-5875 ? Social Security Administration: May apply for disability if have a Stage IV cancer. (309)745-0714 (947)836-5540 ? [employer], Disability and Transit Services: Assists with nutrition, care and transit needs. [place] Support Programs: @10RELATIVEDAYS @ > Cancer Support Group  2nd Tuesday of the month 1pm-2pm, Journey Room  > Creative Journey  3rd Tuesday of the month 1130am-1pm, Journey Room  > Look Good Feel Better  1st Wednesday of the month 10am-12 noon, Journey Room (Call [facility] to register [phone])

## 2016-06-09 NOTE — Progress Notes (Signed)
Met with pt face to face.  Introduced myself and explain a little bit about my role as the patient navigator.  Pt given a card with all my information on it.  Told pt to call if they had any questions or concerns.  Pt verbalized understanding.

## 2016-06-14 ENCOUNTER — Ambulatory Visit (HOSPITAL_COMMUNITY)
Admission: RE | Admit: 2016-06-14 | Discharge: 2016-06-14 | Disposition: A | Discharge status: Home / Self Care | Payer: Medicare Other | Source: Ambulatory Visit | Attending: Hematology & Oncology | Admitting: Hematology & Oncology

## 2016-06-14 DIAGNOSIS — C770 Secondary and unspecified malignant neoplasm of lymph nodes of head, face and neck: Secondary | ICD-10-CM | POA: Insufficient documentation

## 2016-06-14 DIAGNOSIS — C321 Malignant neoplasm of supraglottis: Secondary | ICD-10-CM | POA: Insufficient documentation

## 2016-06-14 DIAGNOSIS — I6523 Occlusion and stenosis of bilateral carotid arteries: Secondary | ICD-10-CM | POA: Diagnosis not present

## 2016-06-14 DIAGNOSIS — J432 Centrilobular emphysema: Secondary | ICD-10-CM | POA: Diagnosis not present

## 2016-06-14 LAB — GLUCOSE, CAPILLARY: Glucose-Capillary: 107 mg/dL — ABNORMAL HIGH (ref 65–99)

## 2016-06-14 MED ORDER — FLUDEOXYGLUCOSE F - 18 (FDG) INJECTION
8.4000 | Freq: Once | INTRAVENOUS | Status: AC | PRN
  Administered 2016-06-14: 8.4 via INTRAVENOUS

## 2016-06-15 ENCOUNTER — Encounter (HOSPITAL_BASED_OUTPATIENT_CLINIC_OR_DEPARTMENT_OTHER): Payer: Medicare Other | Admitting: Hematology & Oncology

## 2016-06-15 ENCOUNTER — Encounter (HOSPITAL_COMMUNITY): Payer: Self-pay | Admitting: Hematology & Oncology

## 2016-06-15 VITALS — BP 125/64 | HR 76 | Temp 99.0°F | Resp 16 | Wt 169.3 lb

## 2016-06-15 DIAGNOSIS — C321 Malignant neoplasm of supraglottis: Secondary | ICD-10-CM

## 2016-06-15 DIAGNOSIS — Z720 Tobacco use: Secondary | ICD-10-CM

## 2016-06-15 DIAGNOSIS — G893 Neoplasm related pain (acute) (chronic): Secondary | ICD-10-CM

## 2016-06-15 HISTORY — DX: Malignant neoplasm of supraglottis: C32.1

## 2016-06-15 NOTE — Patient Instructions (Addendum)
[place] at [facility] Discharge Instructions  RECOMMENDATIONS MADE BY THE CONSULTANT AND ANY TEST RESULTS WILL BE SENT TO YOUR REFERRING PHYSICIAN.  You saw Dr. Whitney Muse today. You have an appointment with Dr. Arnoldo Morale on 9/26 at 11:15 to discuss port a cath and feeding tube placement. You will be referred to [facility], the dietician. You will be referred to [facility], the speech therapist. You are scheduled for Chemo teaching on 9/28 at 11:30. Follow up on the first day  Of therapy with lab work. You are also being referred to Dr. Isidore Moos in [place] for radiation therapy.  Thank you for choosing [place] at [facility] to provide your oncology and hematology care.  To afford each patient quality time with our provider, please arrive at least 15 minutes before your scheduled appointment time.   Beginning January 23rd 2017 lab work for the [facility] will be done in the  Main lab at [facility] on 1st floor. If you have a lab appointment with the [facility] please come in thru the  Main Entrance and check in at the main information desk  You need to re-schedule your appointment should you arrive 10 or more minutes late.  We strive to give you quality time with our providers, and arriving late affects you and other patients whose appointments are after yours.  Also, if you no show three or more times for appointments you may be dismissed from the clinic at the providers discretion.     Again, thank you for choosing [facility].  Our hope is that these requests will decrease the amount of time that you wait before being seen by our physicians.       _____________________________________________________________  Should you have questions after your visit to [facility], please contact our office at [phone] between the hours of 8:30 a.m. and 4:30 p.m.  Voicemails left after 4:30 p.m. will not be returned until the  following business day.  For prescription refill requests, have your pharmacy contact our office.         Resources For Cancer Patients and their Caregivers ? American Cancer Society: Can assist with transportation, wigs, general needs, runs Look Good Feel Better.        (604)501-6473 ? Cancer Care: Provides financial assistance, online support groups, medication/co-pay assistance.  1-800-813-HOPE 620-331-2177) ? [place] Assists [place] cancer patients and their families through emotional , educational and financial support.  509-207-2463 ? [place] DSS Where to apply for food stamps, Medicaid and utility assistance. 8545273726 ? [facility]: Transportation to medical appointments. 562-470-0523 ? Social Security Administration: May apply for disability if have a Stage IV cancer. 718-341-1754 (325)828-6408 ? [employer], Disability and Transit Services: Assists with nutrition, care and transit needs. [place] Support Programs: @10RELATIVEDAYS @ > Cancer Support Group  2nd Tuesday of the month 1pm-2pm, Journey Room  > Creative Journey  3rd Tuesday of the month 1130am-1pm, Journey Room  > Look Good Feel Better  1st Wednesday of the month 10am-12 noon, Journey Room (Call American Cancer Society to register [phone])   External Beam Radiation Therapy External beam radiation therapy is a radiation treatment. It may be done to:  Treat cancer. The radiation may be used to:  Destroy cancer cells. Radiation delivered during the treatment damages cancer cells. It also damages normal cells, but normal cells have the DNA to repair themselves while cancer cells  do not.  Help with symptoms of your cancer.  Stop the growth of any remaining cancer cells after surgery.  Prevent cancer cells from growing in areas that do not have evidence of cancer (prophylactic radiation therapy).  Treat or shrink a tumor.  Reduce pain  (palliative therapy). The therapy delivers higher doses of radiation than X-rays, CT scans, and most other imaging tests. Compared with internal radiation therapy, external beam radiation therapy can deliver radiation to a fairly large area.  The amount of radiation you will receive and the length of therapy depends on your medical condition. You should not feel the radiation being delivered or any pain during your therapy. RISKS AND COMPLICATIONS Most people experience side effects from the therapy. Side effects depend on the amount of radiation and the part of your body exposed to radiation. For example:  Hair loss may occur if the radiation therapy is directed to your head.  Coughing or difficulty swallowing may occur if the radiation therapy is directed to your head, neck, or chest.  Nausea, vomiting, or diarrhea may occur if the radiation therapy is directed to your abdomen or pelvis.  Bladder problems, frequent urination, or sexual dysfunction may occur if the radiation therapy is directed to your bladder, kidney, or prostate. Regardless of the amount or location of the radiation, you will probably have fatigue. Other side effects may include:  Red, flaking skin in the affected area.  Hair loss in the affected area.  Itching in the affected area. Side effects may take 2-3 weeks to develop. Most side effects are temporary and can be controlled. Once the therapy is complete, side effects will not stop right away. It can take up to 3-4 weeks for you to regain your energy or for side effects to lessen. Your body does heal from the radiation. BEFORE THE PROCEDURE There will be a planning session (simulation). During the session:  Your health care provider will plan exactly where the radiation will be delivered (treatment field).  You will be positioned for your therapy. The goal is to have a position that can be reproduced for each therapy session.  Temporary marks may be drawn on your  body. Permanent marks may also be drawn on your body in order for you to be positioned the same way for each therapy session. PROCEDURE  You will either lie on a table or sit in a chair in the position determined for your therapy.  The radiation machine (linear accelerator) will move around you to deliver the radiation in exact doses from many angles. AFTER THE PROCEDURE You may return to your normal schedule including diet, activities, and medicines, unless your health care provider tells you otherwise.   This information is not intended to replace advice given to you by your health care provider. Make sure you discuss any questions you have with your health care provider.   Document Released: 01/29/2009 Document Revised: 06/03/2015 Document Reviewed: 08/21/2013 Elsevier Interactive Patient Education 2016 [place] An implanted port is a type of central line that is placed under the skin. Central lines are used to provide IV access when treatment or nutrition needs to be given through a person's veins. Implanted ports are used for long-term IV access. An implanted port may be placed because:   You need IV medicine that would be irritating to the small veins in your hands or arms.   You need long-term IV medicines, such as antibiotics.   You need  IV nutrition for a long period.   You need frequent blood draws for lab tests.   You need dialysis.  Implanted ports are usually placed in the chest area, but they can also be placed in the upper arm, the abdomen, or the leg. An implanted port has two main parts:   Reservoir. The reservoir is round and will appear as a small, raised area under your skin. The reservoir is the part where a needle is inserted to give medicines or draw blood.   Catheter. The catheter is a thin, flexible tube that extends from the reservoir. The catheter is placed into a large vein. Medicine that is inserted into the reservoir  goes into the catheter and then into the vein.  HOW WILL I CARE FOR MY INCISION SITE? Do not get the incision site wet. Bathe or shower as directed by your health care provider.  HOW IS MY PORT ACCESSED? Special steps must be taken to access the port:   Before the port is accessed, a numbing cream can be placed on the skin. This helps numb the skin over the port site.   Your health care provider uses a sterile technique to access the port.  Your health care provider must put on a mask and sterile gloves.  The skin over your port is cleaned carefully with an antiseptic and allowed to dry.  The port is gently pinched between sterile gloves, and a needle is inserted into the port.  Only "non-coring" port needles should be used to access the port. Once the port is accessed, a blood return should be checked. This helps ensure that the port is in the vein and is not clogged.   If your port needs to remain accessed for a constant infusion, a clear (transparent) bandage will be placed over the needle site. The bandage and needle will need to be changed every week, or as directed by your health care provider.   Keep the bandage covering the needle clean and dry. Do not get it wet. Follow your health care provider's instructions on how to take a shower or bath while the port is accessed.   If your port does not need to stay accessed, no bandage is needed over the port.  WHAT IS FLUSHING? Flushing helps keep the port from getting clogged. Follow your health care provider's instructions on how and when to flush the port. Ports are usually flushed with saline solution or a medicine called heparin. The need for flushing will depend on how the port is used.   If the port is used for intermittent medicines or blood draws, the port will need to be flushed:   After medicines have been given.   After blood has been drawn.   As part of routine maintenance.   If a constant infusion is running,  the port may not need to be flushed.  HOW LONG WILL MY PORT STAY IMPLANTED? The port can stay in for as long as your health care provider thinks it is needed. When it is time for the port to come out, surgery will be done to remove it. The procedure is similar to the one performed when the port was put in.  WHEN SHOULD I SEEK IMMEDIATE MEDICAL CARE? When you have an implanted port, you should seek immediate medical care if:   You notice a bad smell coming from the incision site.   You have swelling, redness, or drainage at the incision site.   You have more  swelling or pain at the port site or the surrounding area.   You have a fever that is not controlled with medicine.   This information is not intended to replace advice given to you by your health care provider. Make sure you discuss any questions you have with your health care provider.   Document Released: 09/12/2005 Document Revised: 07/03/2013 Document Reviewed: 05/20/2013 Elsevier Interactive Patient Education 2016 [employer].  Feeding Tube Use Some people who have trouble swallowing or cannot take food or medicine by mouth may need a feeding tube. A feeding tube can go into the nose and down to the stomach or small bowel or through the skin in the abdomen and into the stomach or small bowel. Liquid food (formula), breast milk, or medicines may be given through the tube.  SUPPLIES NEEDED FOR A TUBE FEEDING   Clean gloves.  Prescribed formula or breast milk.  Appropriate feeding bag set, gravity drip tubing set, or 30-60-mL syringe with feeding extension tubing.  Feeding tube pump or syringe pump as needed.  Pole as needed.  20-60-mL syringe to check tube placement.  A syringe to flush the feeding tube.  Container.  Water. GIVING A FEEDING THROUGH A FEEDING TUBE PUMP 1. Have all supplies ready and available. 2. Wash hands well. 3. Put on clean gloves. 4. Check the placement of the feeding tube as  directed. 5. Elevate the head of the person 30-45 degrees or as directed. 6. Pour up to 4 hours of room-temperature feeding into the feeding bag set. 7. Hang the feeding bag set from a pole. 8. Flush the entire feeding bag set with the feeding. 9. Load the feeding bag set into the feeding tube pump. 10. Cap the feeding bag set. 11. Uncap the feeding tube. 12. Using a syringe, flush the feeding tube with water as directed. 13. Uncap the feeding bag set. 14. Connect the feeding bag set to the feeding tube. 15. Remove gloves. 16. Wash hands well. 17. Set the prescribed feeding rate. 18. Start the feeding tube pump. GIVING A FEEDING THROUGH A SYRINGE PUMP  1. Have all supplies ready and available. 2. Wash hands well. 3. Put on clean gloves. 4. Check the placement of the feeding tube as directed. 5. Elevate the head of the person 30-45 degrees or as directed. 6. Draw up to 4 hours of room-temperature formula into the correctly sized syringe. 7. Attach the syringe to the feeding extension tubing. 8. Flush the entire feeding extension tubing with the feeding. 9. Load the syringe and feeding extension tubing into the syringe pump. 10. Cap the feeding extension tubing. 11. Uncap the feeding tube. 12. Using a syringe, flush the feeding tube with water as directed. 13. Uncap the feeding extension tubing. 14. Connect the feeding extension tubing to the feeding tube. 15. Remove gloves. 16. Wash hands well. 17. Set the prescribed feeding rate. 18. Start the syringe pump. GIVING A FEEDING BY GRAVITY  1. Have all supplies ready and available. 2. Wash hands well. 3. Put on clean gloves. 4. Check the placement of the feeding tube as directed. 5. Elevate the head of the person 30-45 degrees or as directed. 6. Close the clamp on the gravity drip tubing set. 7. Pour up to 4 hours of room-temperature feeding into the bag of a gravity drip tubing set. Or, if using a ready-to-hang formula container,  connect the gravity drip tubing set to the ready-to-hang container. 8. Hang the feeding with the attached gravity drip tubing  set from a pole. 9. Open the roller clamp on the gravity drip tubing to fill the entire tubing with the feeding. 10. Close the roller clamp. 11. Cap the gravity drip tubing. 12. Uncap the feeding tube. 13. Using a syringe, flush the feeding tube with water as directed. 14. Uncap the gravity drip tubing. 15. Connect the gravity drip tubing to the feeding tube. 16. Using the roller clamp, adjust the feeding rate to deliver the feeding at the prescribed rate. 17. Remove gloves. 18. Wash hands well. GIVING A BOLUS FEEDING THROUGH A FEEDING TUBE Remove the plunger from the syringe. Attach the syringe to the feeding tube. Pour the feeding into the syringe and administer at the prescribed rate by means of gravity. A feeding bag may also be used for bolus feedings, depending on the volume of feeding given.  SUPPLIES NEEDED FOR GIVING MEDICINES THROUGH A FEEDING TUBE  60-mL syringe.  Container.  Water.  Medicine.  Pill crusher if medicine is in tablet form.  Clean gloves. GIVING MEDICINES THROUGH A FEEDING TUBE  1. Have all supplies ready and available. 2. Wash hands well. 3. If the medicine cannot be given with the feeding, stop the feeding 60 minutes before giving the medicine. 4. If the person needs to take medicine on an empty stomach, consider not giving a feeding for up to 2 hours (hold time) before giving medicine. Talk to the caregiver about appropriate hold times. 5. Fill a container with 50-100 mL of warm water. 6. Prepare medicine that will go into the feeding tube.  Liquid--Measure the prescribed medicine dose.  Tablet--Crush the tablet using a pill-crushing device. Grind the pill to a fine powder. Dissolve the powder in at least 30 mL of warm water or as directed. If there is more than 1 tablet, crush and dilute each one individually.  Capsule--Open  a capsule containing dry pellets or pierce a capsule containing liquid (gelcap). Empty the contents in 30 mL of warm water or as directed. Gelcaps can also be dissolved in warm water. 7. Elevate the head of the person 30-45 degrees or as directed. 8. Wash hands well. 9. Put on clean gloves. 10. If a continuous tube feeding is infusing, stop the tube feeding. 11. If applicable, close the tubing clamp. 12. Disconnect the feeding bag set or gravity drip tubing set from the feeding tube. 13. Cap the feeding bag set or gravity drip tubing set. 14. Check the placement of the feeding tube as directed. 15. Draw up 30 mL of water into a syringe or as directed. 16. Insert the tip of the syringe into the feeding tube. 17. Using the syringe, flush the feeding tube with water. 18. Remove the plunger of the syringe or replace the syringe with a syringe that contains medicine. 19. Give the medicine as directed.  If giving only 1 dose of medicine, flush the feeding tube with water after giving the medicine.  If giving more than 1 dose of medicine, give each separately. Flush the feeding tube between medicines and after the last dose of medicine. 20. If a tube feeding will not be continued after the medicine, cap the feeding tube. Position the person to a comfortable position, but keep his or her head elevated for 1 hour after giving the medicine. 21. If a tube feeding will be continued after the medicine, but the medicine cannot be given with the feeding, continue to hold the feeding for 30-60 minutes after the medicine is given. When appropriate, reconnect the  feeding bag set or gravity drip tubing set to the feeding tube. 45. Throw away or clean used supplies as directed. 23. Remove gloves. 24. Wash hands well.   This information is not intended to replace advice given to you by your health care provider. Make sure you discuss any questions you have with your health care provider.   Document Released:  08/29/2012 Document Reviewed: 08/29/2012 Elsevier Interactive Patient Education [employer].

## 2016-06-21 ENCOUNTER — Encounter: Payer: Self-pay | Admitting: Dietician

## 2016-06-21 NOTE — Patient Instructions (Addendum)
[place]   CHEMOTHERAPY INSTRUCTIONS   Cisplatin - this medication is hard on your kidneys. This is why we give you a large amount of fluids through your IV while you are here getting chemo. We also need you drinking 64 oz of fluid (preferably water/decaff fluids) 2 days prior to chemo and for up to 4-5 days after chemo. Drink more if you can. This will help keep your kidneys flushed. This can also cause acute and/or delayed nausea/vomiting. You must take your nausea meds as prescribed if you get nauseated. Do not wait until you start vomiting. This med can also cause peripheral neuropathy (numbness/tingling/burning in hands/fingers/feet/toes). Let us know if this develops so that we can monitor it and treat if necessary. (Takes 1 hour to infuse)   EDUCATIONAL MATERIALS GIVEN AND REVIEWED: Chemotherapy and you book given, nutrition book given, information on cisplatin    SELF CARE ACTIVITIES WHILE ON CHEMOTHERAPY: Hydration  Increase your fluid intake 48 hours prior to treatment and drink at least 8 to 12 cups (64 ounces) of water/decaff beverages per day after treatment. You can still have your cup of coffee or soda but these beverages do not count as part of your 8 to 12 cups that you need to drink daily. No alcohol intake.  Medications  Continue taking your normal prescription medication as prescribed.  If you start any new herbal or new supplements please let us know first to make sure it is safe.  Mouth Care  Have teeth cleaned professionally before starting treatment. Keep dentures and partial plates clean. Use soft toothbrush and do not use mouthwashes that contain alcohol. Biotene is a good mouthwash that is available at most pharmacies or may be ordered by calling [phone]. Use warm salt water gargles (1 teaspoon salt per 1 quart warm water) before and after meals and at bedtime. Or you may rinse with 2 tablespoons of three-percent hydrogen peroxide mixed in  eight ounces of water. If you are still having problems with your mouth or sores in your mouth please call the clinic. If you need dental work, please let Dr. Whitney Muse know before you go for your appointment so that we can coordinate the best possible time for you in regards to your chemo regimen. You need to also let your dentist know that you are actively taking chemo. We may need to do labs prior to your dental appointment.   Skin Care  Always use sunscreen that has not expired and with SPF (Sun Protection Factor) of 50 or higher. Wear hats to protect your head from the sun. Remember to use sunscreen on your hands, ears, face, & feet.  Use good moisturizing lotions such as udder cream, eucerin, or even Vaseline. Some chemotherapies can cause dry skin, color changes in your skin and nails.    . Avoid long, hot showers or baths. . Use gentle, fragrance-free soaps and laundry detergent. . Use moisturizers, preferably creams or ointments rather than lotions because the thicker consistency is better at preventing skin dehydration. Apply the cream or ointment within 15 minutes of showering. Reapply moisturizer at night, and moisturize your hands every time after you wash them. Hair Loss (if your doctor says your hair will fall out)  . If your doctor says that your hair is likely to fall out, decide before you begin chemo whether you want to wear a wig. You may want to shop before treatment to match your hair color. . Hats, turbans, and scarves can  also camouflage hair loss, although some people prefer to leave their heads uncovered. If you go bare-headed outdoors, be sure to use sunscreen on your scalp. . Cut your hair short. It eases the inconvenience of shedding lots of hair, but it also can reduce the emotional impact of watching your hair fall out. . Don't perm or color your hair during chemotherapy. Those chemical treatments are already damaging to hair and can enhance hair loss. Once your chemo  treatments are done and your hair has grown back, it's OK to resume dyeing or perming hair. With chemotherapy, hair loss is almost always temporary. But when it grows back, it may be a different color or texture. In older adults who still had hair color before chemotherapy, the new growth may be completely gray.  Often, new hair is very fine and soft.  Infection Prevention  Please wash your hands for at least 30 seconds using warm soapy water. Handwashing is the #1 way to prevent the spread of germs. Stay away from sick people or people who are getting over a cold. If you develop respiratory systems such as green/yellow mucus production or productive cough or persistent cough let us know and we will see if you need an antibiotic. It is a good idea to keep a pair of gloves on when going into grocery stores/[employer] to decrease your risk of coming into contact with germs on the carts, etc. Carry alcohol hand gel with you at all times and use it frequently if out in public. If your temperature reaches 100.5 or higher please call the clinic and let us know.  If it is after hours or on the weekend please go to the ER if your temperature is over 100.5.  Please have your own personal thermometer at home to use.    Sex and bodily fluids  If you are going to have sex, a condom must be used to protect the person that isn't taking chemotherapy. Chemo can decrease your libido (sex drive). For a few days after chemotherapy, chemotherapy can be excreted through your bodily fluids.  When using the toilet please close the lid and flush the toilet twice.  Do this for a few day after you have had chemotherapy.   Animals If you have cats or birds we just ask that you not change the litter or change the cage.  Please have someone else do this for you while you are on chemotherapy.   Food Safety During and After Cancer Treatment  Food safety is important for people both during and after cancer treatment. Cancer and cancer  treatments, such as chemotherapy, radiation therapy, and stem cell/bone marrow transplantation, often weaken the immune system. This makes it harder for your body to protect itself from foodborne illness, also called food poisoning. Foodborne illness is caused by eating food that contains harmful bacteria, parasites, or viruses. Foods to avoid Some foods have a higher risk of becoming tainted with bacteria. These include: Marland Kitchen Unwashed fresh fruit and vegetables, especially leafy vegetables that can hide dirt and other contaminants . Raw sprouts, such as alfalfa sprouts . Raw or undercooked beef, especially ground beef, or other raw or undercooked meat and poultry . Cold hot dogs or deli lunch meat (cold cuts), including dry-cured, uncooked salami. Always cook or reheat these foods until they are steaming hot. . Fatty, fried, or spicy foods immediately before or after treatment.  These can sit heavy on your stomach and make you feel nauseous. . Raw or undercooked  shellfish, such as oysters. . Sushi and sashimi, which often contain raw fish.  . Unpasteurized beverages, such as unpasteurized fruit juices, raw milk, raw yogurt, or cider . Soft cheeses made from unpasteurized milk, such as blue-veined (a type of blue cheese), Brie, Camembert, feta, goat cheese, and queso fresco or blanco . Undercooked eggs, such as soft boiled, over easy, and poached; raw, unpasteurized eggs; or foods made with raw egg, such as homemade raw cookie dough and homemade mayonnaise . Deli-prepared salads with egg, ham, chicken, or seafood Simple steps for food safety Shop smart. . Do not buy food stored or displayed in an unclean area. . Do not buy bruised or damaged fruits or vegetables. . Do not buy cans that have cracks, dents, or bulges. . Pick up foods that can spoil at the end of your shopping trip and store them in a cooler on the way home. Prepare and clean up foods carefully. . Rinse all fresh fruits and vegetables  under running water, and dry them with a clean towel or paper towel. . Clean the top of cans before opening them. . After preparing food, wash your hands for 20 seconds with hot water and soap. Pay special attention to areas between fingers and under nails. . Clean your utensils and dishes with hot water and soap. Marland Kitchen Disinfect your kitchen and cutting boards using 1 teaspoon of liquid, unscented bleach mixed into 1 quart of water.   Prevent cross-contamination. Marland Kitchen Keep raw meat, poultry, and fish or their juices away from other food. Bacteria can spread through contact with the food or its liquid, causing cross-contamination. . Do not rinse raw meat or poultry because it can spread bacteria to nearby surfaces. Wendee Copp all items you used for preparing raw foods, including utensils, cutting board, and plates, before using them for other foods or cooked meat. . Set aside a specific cutting board for preparing uncooked meat, fish, and chicken. Never use it for uncooked fruits, vegetables, or other foods. Dispose of old food. . Eat canned and packaged food before its expiration date (the "use by" or "best before" date). . Consume refrigerated leftovers within 3 to 4 days. After that time, throw out the food. Even if the food does not smell or look spoiled, it still may be unsafe. Some bacteria, such as Listeria, can grow even on foods stored in the refrigerator if they are kept for too long. Take precautions when eating out. . At restaurants, avoid buffets and salad bars where food sits out for a long time and comes in contact with many people. Food can become contaminated when someone with a virus, often a norovirus, or another "bug" handles it. . Put any leftover food in a "to-go" container yourself, rather than having the server do it. And, refrigerate leftovers as soon as you get home. . Choose restaurants that are clean and that are willing to prepare your food as you order it cooked. Cook food to the  right temperature. Use a food thermometer to check for a safe internal temperature of all poultry and meat. For instance, a hamburger should be cooked to at least medium (160?F or 71?C). Get a full list of recommended internal cooking temperatures on the website of the U.S. Food and Drug Administration (FDA).  Chill food promptly. Refrigerate or freeze perishable food within 2 hours of cooking or buying it (sooner in warm weather.) Proper cooking destroys bacteria, but they can still grow on cooked food that is left out  too long. Food stored in the refrigerator should be kept at below 40?F (4?C). And, food stored in the freezer should be kept below 32?F (0?C). Thaw food properly. Thaw frozen food in the refrigerator rather than at room temperature. You can also thaw food in frequently changed cold water or in the microwave, but cook it as soon as it thaws.   MEDICATIONS:                                                                                                      Zofran/Ondansetron 8mg  tablet. Take 1 tablet every 8 hours as needed for nausea/vomiting. (#1 nausea med to take, this can constipate)  Compazine/Prochlorperazine 10mg  tablet. Take 1 tablet every 6 hours as needed for nausea/vomiting. (#2 nausea med to take, this can make you sleepy)   EMLA cream. Apply a quarter size amount to port site 1 hour prior to chemo. Do not rub in. Cover with plastic wrap.   Over-the-Counter Meds:  Miralax 1 capful in 8 oz of fluid daily. May increase to two times a day if needed. This is a stool softener. If this doesn't work proceed you can add:  Senokot S  - start with 1 tablet two times a day and increase to 4 tablets two times a day if needed. (total of 8 tablets in a 24 hour period). This is a stimulant laxative.   Call us if this does not help your bowels move.   Imodium 2mg  capsule. Take 2 capsules after the 1st loose stool and then 1 capsule every 2 hours until you go a total of 12 hours  without having a loose stool. Call the [facility] if loose stools continue. If diarrhea occurs @ bedtime, take 2 capsules @ bedtime. Then take 2 capsules every 4 hours until morning. Call [facility].    SYMPTOMS TO REPORT AS SOON AS POSSIBLE AFTER TREATMENT:  FEVER GREATER THAN 100.5 F  CHILLS WITH OR WITHOUT FEVER  NAUSEA AND VOMITING THAT IS NOT CONTROLLED WITH YOUR NAUSEA MEDICATION  UNUSUAL SHORTNESS OF BREATH  UNUSUAL BRUISING OR BLEEDING  TENDERNESS IN MOUTH AND THROAT WITH OR WITHOUT PRESENCE OF ULCERS  URINARY PROBLEMS  BOWEL PROBLEMS  UNUSUAL RASH    Wear comfortable clothing and clothing appropriate for easy access to any Portacath or PICC line. Let us know if there is anything that we can do to make your therapy better!      I have been informed and understand all of the instructions given to me and have received a copy. I have been instructed to call the clinic [phone])  or my family physician as soon as possible for continued medical care, if indicated. I do not have any more questions at this time but understand that I may call the [facility] at [phone] during office hours should I have questions or need assistance in obtaining follow-up care.

## 2016-06-21 NOTE — Progress Notes (Addendum)
In depth assessment w/ Education and TF recs for new H&N patient   RD consulted for new H&N patient who is to begin treatment. He is scheduled for PEG and cath placement on 06/27/16  PMHx: 70y/o male w/ COPD, ongoing tobacco abuse, HTN.   Wt Readings from Last 10 Encounters:  06/15/16 169 lb 4.8 oz (76.8 kg)  06/09/16 168 lb 6.4 oz (76.4 kg)  05/16/16 170 lb (77.1 kg)  02/24/16 173 lb 6.4 oz (78.7 kg)  06/21/11 165 lb (74.8 kg)   Objective info Patient weight has appears to have remained stable ~170 lbs.  Pt states his UBW is about 170 lbs.   Patient reports oral intake as relatively baseline and is suffering from symptoms including . He reports a slightly declining appetite and mild dysphagia/ odynophagia.He says he manages it well by taking his time eating and chewing his food well before he swallows. He occasionally will drink Boost/Ensure, but this is nothing routine. His diet is variable-eats all foods including meats, vegetables.  He only eats 2 meals and a small snack daily.  PO intake Education While he is able to tolerate PO intake, he is to prioritize eating high kcal/protein foods as able and making sure to eat frequently. RD went over high protein food sources. We discussed eating small meals frequently and adding extra condiments to food.  Pt is advised to continue eating orally as long as safe to do so. Educated on  potential detrimental effects of therapy on swallowing function and the need to "excercise" this complex physiological motion as much as possible and for as long as possible to lessen disuse and improve chances of recovery of swallow function post therapy. However PO intake is not reccomended if notes s/s of potential dyspahgia or aspiration; these are taught to pt today. Pt is reccommended to follow recs from SLP whom he is scheduled to see today.  TF/PEG education RD went over the below TF regimen and the reasoning behind the formula choice and amount.   RD went  over feeding tube basics. He is to only place water and tube feeding formula through his tube. Soda and juice should NOT be flushed through tube due to clog potential. He is to not overly tend to tube site. It should only be cleaned with soap and water. No topicals unless otherwise directed. He is to remain upright while infusing TF and should be upright for 30 minutes after infusion. RD covered bolus TF steps, flushing instructions and how to infuse medications if needed.  Tube feeding formula should be stored and infused at room temperature. Opened cans can be refrigerated x 24 hrs, but need to let come to room temp before using.   Pt is taught the importance of flushing as these are what will keep tube patent and provide his essential hydration. To encourage compliance, he is told risks of dehydration w/ Cisplatin and risk of life long renal damage.  Pt is taught the signs/symptoms of TF intolerance such as severe bloating/distension, diarrhea, nausea, abdominal pain. His stool should be soft and slightly unformed, but NOT watery or diarrhea like in appearance.   Subjective Pt received information well and appeared to understand. He is understandably a little overwhelmed at this time. RD reinforced he is available for all questions and to monitor through therapy. He did seem a little surprised when RD stated he would need upwards of >2300 kcals "I have never been a big eater". Reinforced that this is a very strenuous  therapy regimen and his needs are much increased.    Pt did not have any prior preference of home health agencies. RD will ask [facility] rep to contact.    Estimated Needs Latest Anthropometrics (from peg/cath surg H&P) Wt: 169 lbs (76.82 kg) Ht: 5\' 9"   (175.26 cm) BMI: 25.1  Estimated needs while undergoing Concomitant Chemoradiation Kcals: 2300-2550 (30-33 kcal/kg bw) Protein: 108- 123 g (1.4-1.6 g/kg ibw) Fluid: >/=2.3 liters   Initial TF regimen 7 cans of Osmolite 1.5. Split  into 4 meals of 2-2-2-1, smallest being before bed. If able to tolerate, Flush with 120 cc before and after.   This will equate to ~714 mls/ml, which should be tolerable, if unable to tolerate flushes + TF, can reduce flushes to only 60 cc + Bolus additional 8 syringes (480 cc) throughout day, in between feeds, to meet recs.   TF regimen will provide: Kcals: 2489 kcals Protein: 104 g Pro Fluid: 1267 ml + 960 (flushes).   Patient is encouraged to eat for as long as able. However he is instructed to administer .5-1 can 4x daily to become comfortable with his tube and get in habit of the 4 feed regimen.   He is given RD card and told to contact RD if he experiences any signs of intolerance to TF.   Left my contact info, coupons,and handouts titled "Feeding Tube Use and Care (Bolus/Syringe Feedings)"  And "Increasing Calories and Protein"  [name] RD, LDN, [facility] Pager: [phone] 06/21/2016 4:57 PM

## 2016-06-21 NOTE — H&P (Signed)
  NTS SOAP Note  Vital Signs:  Vitals as of: 123456: Systolic [id number]: Diastolic 69: Heart Rate 67: Temp 98.73F (Temporal): Height 66ft 9in: Weight 169Lbs 0 Ounces: BMI 24.96   BMI : 24.96 kg/m2  Subjective: This 70 year old male presents forportacath and PEG placement.  Referred by Dr. Whitney Muse.  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  Needs both placed for treatment.  Review of Symptoms:  Constitutional:negative Head:negative Eyes:negative sinus problems, sore throat Cardiovascular:negative Respiratory:negative Gastrointestindyspepsia Genitourinary:negative back pain Skin:negative Hematolgic/Lymphatic:negative Allergic/Immunologic:negative   Past Medical History:Reviewed  Past Medical History  Surgical History: right iliac stent, hemorrhoidectomy, laryngoscopy Medical Problems: laryngeal carcinoma, PVD, AAA, high cholesterol, COPD Allergies: nkda Medications: lorazepam, tramadol, pantoprazole, atorvastatin, fenofibrate, proair   Social History:Reviewed  Social History  Preferred Language: English Race:  Other Ethnicity: Not Hispanic / Latino Age: 70 year Marital Status:  S Alcohol: ?   Smoking Status: Current every day smoker reviewed on 06/21/2016 Started Date:  Packs per week:  Functional Status reviewed on 06/21/2016 ------------------------------------------------ Bathing: Normal Cooking: Normal Dressing: Normal Driving: Normal Eating: Normal Managing Meds: Normal Oral Care: Normal Shopping: Normal Toileting: Normal Transferring: Normal Walking: Normal Cognitive Status reviewed on 06/21/2016 ------------------------------------------------ Attention: Normal Decision Making: Normal Language: Normal Memory: Normal Motor: Normal Perception: Normal Problem Solving: Normal Visual and Spatial: Normal   Family History:Reviewed  Family Health History Mother, Deceased;  Healthy;  Father, Deceased; Heart attack (myocardial infarction);     Objective Information: General:Well appearing, well nourished in no distress. Neck:Supple with lymphadenopathy.  Heart:RRR, no murmur or gallop.  Normal S1, S2.  No S3, S4.  Lungs:CTA bilaterally, no wheezes, rhonchi, rales.  Breathing unlabored. Abdomen:Soft, NT/ND, normal bowel sounds, no HSM, no masses.  No peritoneal signs. Dr. Donald Pore notes reviewed. 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   Patient Education:Alternative treatments to surgery were discussed with patient (and family).Risks and benefits  of procedure including bleeding, infection, pneumothorax, and bowel injury were fully explained to the patient (and family) who gave informed consent. Patient/family questions were addressed.  Follow-up:Pending Surgery

## 2016-06-22 NOTE — Patient Instructions (Signed)
Bruce [name]  06/22/2016     @PREFPERIOPPHARMACY @   Your procedure is scheduled on 06/27/2016.  Report to [facility] at 7:00 A.M.  Call this number if you have problems the morning of surgery:  [phone]   Remember:  Do not eat food or drink liquids after midnight.  Take these medicines the morning of surgery with A SIP OF WATER Albuterol inhaler and bring with you, Norco or oxycodone, Ativan, Protonix   Do not wear jewelry, make-up or nail polish.  Do not wear lotions, powders, or perfumes, or deoderant.  Do not shave 48 hours prior to surgery.  Men may shave face and neck.  Do not bring valuables to the hospital.  [facility] is not responsible for any belongings or valuables.  Contacts, dentures or bridgework may not be worn into surgery.  Leave your suitcase in the car.  After surgery it may be brought to your room.  For patients admitted to the hospital, discharge time will be determined by your treatment team.  Patients discharged the day of surgery will not be allowed to drive home.    Please read over the following fact sheets that you were given. Surgical Site Infection Prevention and Anesthesia Post-op Instructions     PATIENT INSTRUCTIONS POST-ANESTHESIA  IMMEDIATELY FOLLOWING SURGERY:  Do not drive or operate machinery for the first twenty four hours after surgery.  Do not make any important decisions for twenty four hours after surgery or while taking narcotic pain medications or sedatives.  If you develop intractable nausea and vomiting or a severe headache please notify your doctor immediately.  FOLLOW-UP:  Please make an appointment with your surgeon as instructed. You do not need to follow up with anesthesia unless specifically instructed to do so.  WOUND CARE INSTRUCTIONS (if applicable):  Keep a dry clean dressing on the anesthesia/puncture wound site if there is drainage.  Once the wound has quit draining you may leave it open to air.  Generally you  should leave the bandage intact for twenty four hours unless there is drainage.  If the epidural site drains for more than 36-48 hours please call the anesthesia department.  QUESTIONS?:  Please feel free to call your physician or the hospital operator if you have any questions, and they will be happy to assist you.      Implanted Port Insertion An implanted port is a central line that has a round shape and is placed under the skin. It is used as a long-term IV access for:   Medicines, such as chemotherapy.   Fluids.   Liquid nutrition, such as total parenteral nutrition (TPN).   Blood samples.  LET [facility] CARE PROVIDER KNOW ABOUT:  Allergies to food or medicine.   Medicines taken, including vitamins, herbs, eye drops, creams, and over-the-counter medicines.   Any allergies to heparin.  Use of steroids (by mouth or creams).   Previous problems with anesthetics or numbing medicines.   History of bleeding problems or blood clots.   Previous surgery.   Other health problems, including diabetes and kidney problems.   Possibility of pregnancy, if this applies. RISKS AND COMPLICATIONS Generally, this is a safe procedure. However, as with any procedure, problems can occur. Possible problems include:  Damage to the blood vessel, bruising, or bleeding at the puncture site.   Infection.  Blood clot in the vessel that the port is in.  Breakdown of the skin over your port.  Very rarely a person may  develop a condition called a pneumothorax, a collection of air in the chest that may cause one of the lungs to collapse. The placement of these catheters with the appropriate imaging guidance significantly decreases the risk of a pneumothorax.  BEFORE THE PROCEDURE   Your health care provider may want you to have blood tests. These tests can help tell how well your kidneys and liver are working. They can also show how well your blood clots.   If you take blood  thinners (anticoagulant medicines), ask your health care provider when you should stop taking them.   Make arrangements for someone to drive you home. This is necessary if you have been sedated for your procedure.  PROCEDURE  Port insertion usually takes about 30-45 minutes.   An IV needle will be inserted in your arm. Medicine for pain and medicine to help relax you (sedative) will flow directly into your body through this needle.   You will lie on an exam table, and you will be connected to monitors to keep track of your heart rate, blood pressure, and breathing throughout the procedure.  An oxygen monitoring device may be attached to your finger. Oxygen will be given.   Everything will be kept as germ free (sterile) as possible during the procedure. The skin near the point of the incision will be cleansed with antiseptic, and the area will be draped with sterile towels. The skin and deeper tissues over the port area will be made numb with a local anesthetic.  Two small cuts (incisions) will be made in the skin to insert the port. One will be made in the neck to obtain access to the vein where the catheter will lie.   Because the port reservoir will be placed under the skin, a small skin incision will be made in the upper chest, and a small pocket for the port will be made under the skin. The catheter that will be connected to the port tunnels to a large central vein in the chest. A small, raised area will remain on your body at the site of the reservoir when the procedure is complete.  The port placement will be done under imaging guidance to ensure the proper placement.  The reservoir has a silicone covering that can be punctured with a special needle.   The port will be flushed with normal saline, and blood will be drawn to make sure it is working properly.  There will be nothing remaining outside the skin when the procedure is finished.   Incisions will be held together by  stitches, surgical glue, or a special tape. AFTER THE PROCEDURE  You will stay in a recovery area until the anesthesia has worn off. Your blood pressure and pulse will be checked.  A final chest X-ray will be taken to check the placement of the port and to ensure that there is no injury to your lung.   This information is not intended to replace advice given to you by your health care provider. Make sure you discuss any questions you have with your health care provider.   Document Released: 07/03/2013 Document Revised: 10/03/2014 Document Reviewed: 07/03/2013 Elsevier Interactive Patient Education 2016 [place] of a Feeding Tube People who have trouble swallowing or cannot take food or medicine by mouth are sometimes given feeding tubes. A feeding tube can go into the nose and down to the stomach or through the skin in the abdomen and into the stomach or small bowel. Some of  the names of these feeding tubes are gastrostomy tubes, PEG lines, nasogastric tubes, and gastrojejunostomy tubes.  SUPPLIES NEEDED TO CARE FOR THE TUBE SITE  Clean gloves.  Clean wash cloth, gauze pads, or soft paper towel.  Cotton swabs.  Skin barrier ointment or cream.  Soap and water.  Pre-cut foam pads or gauze (that go around the tube).  Tube tape. TUBE SITE CARE 1. Have all supplies ready and available. 2. Wash hands well. 3. Put on clean gloves. 4. Remove the soiled foam pad or gauze, if present, that is found under the tube stabilizer. Change the foam pad or gauze daily or when soiled or moist. 5. Check the skin around the tube site for redness, rash, swelling, drainage, or extra tissue growth. If you notice any of these, call your caregiver. 6. Moisten gauze and cotton swabs with water and soap. 7. Wipe the area closest to the tube (right near the stoma) with cotton swabs. Wipe the surrounding skin with moistened gauze. Rinse with water. 8. Dry the skin and stoma site with a dry gauze pad  or soft paper towel. Do not use antibiotic ointments at the tube site. 9. If the skin is red, apply a skin barrier cream or ointment (such as petroleum jelly) in a circular motion, using a cotton swab. The cream or ointment will provide a moisture barrier for the skin and helps with wound healing. 10. Apply a new pre-cut foam pad or gauze around the tube. Secure it with tape around the edges. If no drainage is present, foam pads or gauze may be left off. 11. Use tape or an anchoring device to fasten the feeding tube to the skin for comfort or as directed. Rotate where you tape the tube to avoid skin damage from the adhesive. 12. Position the person in a semi-upright position (30-45 degree angle). 13. Throw away used supplies. 14. Remove gloves. 15. Wash hands. SUPPLIES NEEDED TO FLUSH A FEEDING TUBE  Clean gloves.  60 mL syringe (that connects to the feeding tube).  Towel.  Water. FLUSHING A FEEDING TUBE  1. Have all supplies ready and available. 2. Wash hands well. 3. Put on clean gloves. 4. Draw up 30 mL of water in the syringe. 5. Kink the feeding tube while disconnecting it from the feeding-bag tubing or while removing the plug at the end of the tube. Kinking closes the tube and prevents secretions in the tube from spilling out. 6. Insert the tip of the syringe into the end of the feeding tube. Release the kink. Slowly inject the water. 7. If unable to inject the water, the person with the feeding tube should lay on his or her left side. The tip of the tube may be against the stomach wall, blocking fluid flow. Changing positions may move the tip away from the stomach wall. After repositioning, try injecting the water again. 8. After injecting the water, remove the syringe. 9. Always flush before giving the first medicine, between medicines, and after the final medicine before starting a feeding. This prevents medicines from clogging the tube. 10. Throw away used supplies. 11. Remove  gloves. 12. Wash hands.   This information is not intended to replace advice given to you by your health care provider. Make sure you discuss any questions you have with your health care provider.   Document Released: 09/12/2005 Document Revised: 08/29/2012 Document Reviewed: 04/26/2012 Elsevier Interactive Patient Education [employer].

## 2016-06-23 ENCOUNTER — Encounter (HOSPITAL_COMMUNITY)
Admission: RE | Admit: 2016-06-23 | Discharge: 2016-06-23 | Disposition: A | Discharge status: Home / Self Care | Payer: Medicare Other | Source: Ambulatory Visit | Attending: General Surgery | Admitting: General Surgery

## 2016-06-23 ENCOUNTER — Encounter (HOSPITAL_COMMUNITY): Payer: Self-pay

## 2016-06-23 ENCOUNTER — Encounter (HOSPITAL_COMMUNITY): Payer: Medicare Other

## 2016-06-23 ENCOUNTER — Encounter (HOSPITAL_COMMUNITY): Payer: Self-pay | Admitting: Speech Pathology

## 2016-06-23 ENCOUNTER — Ambulatory Visit (HOSPITAL_COMMUNITY): Payer: Medicare Other | Attending: Hematology & Oncology | Admitting: Speech Pathology

## 2016-06-23 ENCOUNTER — Other Ambulatory Visit: Payer: Self-pay

## 2016-06-23 DIAGNOSIS — R1312 Dysphagia, oropharyngeal phase: Secondary | ICD-10-CM | POA: Insufficient documentation

## 2016-06-23 DIAGNOSIS — C321 Malignant neoplasm of supraglottis: Secondary | ICD-10-CM

## 2016-06-23 DIAGNOSIS — Z01812 Encounter for preprocedural laboratory examination: Secondary | ICD-10-CM | POA: Insufficient documentation

## 2016-06-23 DIAGNOSIS — Z01810 Encounter for preprocedural cardiovascular examination: Secondary | ICD-10-CM | POA: Insufficient documentation

## 2016-06-23 HISTORY — DX: Malignant (primary) neoplasm, unspecified: C80.1

## 2016-06-23 LAB — CBC WITH DIFFERENTIAL/PLATELET
Basophils Absolute: 0.1 10*3/uL (ref 0.0–0.1)
Basophils Relative: 1 %
EOS PCT: 4 %
Eosinophils Absolute: 0.4 10*3/uL (ref 0.0–0.7)
HCT: 46.2 % (ref 39.0–52.0)
Hemoglobin: 15.7 g/dL (ref 13.0–17.0)
LYMPHS ABS: 2.8 10*3/uL (ref 0.7–4.0)
LYMPHS PCT: 26 %
MCH: 30.4 pg (ref 26.0–34.0)
MCHC: 34 g/dL (ref 30.0–36.0)
MCV: 89.4 fL (ref 78.0–100.0)
MONO ABS: 0.9 10*3/uL (ref 0.1–1.0)
MONOS PCT: 9 %
Neutro Abs: 6.6 10*3/uL (ref 1.7–7.7)
Neutrophils Relative %: 60 %
PLATELETS: 289 10*3/uL (ref 150–400)
RBC: 5.17 MIL/uL (ref 4.22–5.81)
RDW: 15 % (ref 11.5–15.5)
WBC: 10.8 10*3/uL — AB (ref 4.0–10.5)

## 2016-06-23 LAB — BASIC METABOLIC PANEL
Anion gap: 9 (ref 5–15)
BUN: 15 mg/dL (ref 6–20)
CO2: 25 mmol/L (ref 22–32)
CREATININE: 1.11 mg/dL (ref 0.61–1.24)
Calcium: 10.2 mg/dL (ref 8.9–10.3)
Chloride: 101 mmol/L (ref 101–111)
GFR calc Af Amer: >60 mL/min (ref >60)
GLUCOSE: 104 mg/dL — AB (ref 65–99)
POTASSIUM: 3.8 mmol/L (ref 3.5–5.1)
Sodium: 135 mmol/L (ref 135–145)

## 2016-06-23 MED ORDER — LIDOCAINE-PRILOCAINE 2.5-2.5 % EX CREA: 1.0000 "application " | TOPICAL_CREAM | CUTANEOUS | 3 refills | Status: DC | PRN

## 2016-06-23 MED ORDER — PROCHLORPERAZINE MALEATE 10 MG PO TABS: 10.0000 mg | ORAL_TABLET | Freq: Four times a day (QID) | ORAL | 2 refills | Status: DC | PRN

## 2016-06-23 MED ORDER — ONDANSETRON HCL 8 MG PO TABS: 8.0000 mg | ORAL_TABLET | Freq: Three times a day (TID) | ORAL | 2 refills | Status: DC | PRN

## 2016-06-23 NOTE — Pre-Procedure Instructions (Signed)
Patient given information to sign up for my chart at home. 

## 2016-06-23 NOTE — Therapy (Signed)
[place], [place], [address] Phone: [phone]   Fax:  [phone]  Speech Language Pathology Evaluation  Patient Details  Name: Bruce [name] MRN: [id number] Date of Birth: [birth year] No Data Recorded  Encounter Date: 06/23/2016      End of Session - 06/23/16 2142    Visit Number 1   Number of Visits 8   Date for SLP Re-Evaluation 08/23/16   Authorization Type [employer] Medicare   SLP Start Time 1315   SLP Stop Time  1402   SLP Time Calculation (min) 47 min   Activity Tolerance Patient tolerated treatment well      Past Medical History:  Diagnosis Date  . AAA (abdominal aortic aneurysm) ([place])   . Anxiety   . Cancer (HCC)    laryngeal  . Carpal tunnel syndrome   . Cellulitis   . COPD (chronic obstructive pulmonary disease) ([place])   . Epigastric hernia   . GERD (gastroesophageal reflux disease)   . Hyperlipidemia   . Hypertension     used to take amlodipine, none now    Past Surgical History:  Procedure Laterality Date  . APPENDECTOMY    . DIRECT LARYNGOSCOPY N/A 05/16/2016   Procedure: MICRO DIRECT LARYNGOSCOPY WITH BIOPSY;  Surgeon: Leta Baptist, MD;  Location: [facility];  Service: ENT;  Laterality: N/A;  . HEMORROIDECTOMY    . VASCULAR SURGERY Right 2008   stent to right common iliac artery    There were no vitals filed for this visit.      Subjective Assessment - 06/23/16 1343    Pain Score 4    Pain Location Throat   Pain Orientation Left   Pain Descriptors / Indicators Sore   Pain Frequency Intermittent  when swallowing   Multiple Pain Sites No               Prior Functional Status - 06/23/16 2139      Prior Functional Status   Cognitive/Linguistic Baseline Within functional limits   Type of Home House    Lives With Alone   Available Help at Discharge Family         General - 06/23/16 2139      General Information   Date of Onset 05/27/16   HPI Bruce [name] is a 70 yo male who was referred by Dr. Whitney Muse for clinical swallow evaluation and treat as part of head and neck cancer treatment protocol. Bruce [name] reports a sore throat which started in February 2017, which he discussed with his PCP, Dr. Woody Seller in July 2017. He was seen by Dr. 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 He will be undergoing concurrent chemoradiation (Cisplatin every 21 days for 3 cycles) with curative intent. He is scheduled to have port and PEG placed on Monday, June 27, 2016 in preparation for treatment. Pt currently weighs 169#, smokes 5-6 cigars a day, and reports mild odynophagia with all textures/consistencies. He takes Tramadol for pain.   Type of Study Bedside Swallow Evaluation   Previous Swallow Assessment None on record   Diet Prior to this Study Regular;Thin liquids   Temperature Spikes Noted No   Respiratory Status Room air   History of Recent Intubation No   Behavior/Cognition Alert;Cooperative   Oral Cavity Assessment Within Functional Limits   Oral Care Completed by SLP No   Oral Cavity - Dentition Dentures, top;Dentures, bottom  Vision Functional for self-feeding   Self-Feeding Abilities Able to feed self   Patient Positioning Upright in chair   Baseline Vocal Quality Normal;Hoarse   Volitional Cough Strong   Volitional Swallow Able to elicit          Oral Motor/Sensory Function - 06/23/16 2140      Oral Motor/Sensory Function   Overall Oral Motor/Sensory Function Within functional limits         Ice Chips - 06/23/16 2140      Ice Chips   Ice chips Not tested         Thin Liquid - 06/23/16 2140      Thin Liquid   Thin Liquid Within functional limits   Presentation Cup;Self Fed         Nectar thick liquid - 06/23/16 2140      Nectar Thick Liquid   Nectar Thick Liquid Not tested         Honey Thick Liquid - 06/23/16 2141      Honey  Thick Liquid   Honey Thick Liquid Not tested         Puree - 06/23/16 2141      Puree   Puree Within functional limits   Presentation Self Fed;Spoon         Solid - 06/23/16 2141      Solid   Solid Impaired   Presentation Self Fed   Oral Phase Functional Implications Prolonged oral transit               SLP Short Term Goals - 06/23/16 2144      SLP SHORT TERM GOAL #1   Title Pt will complete swallowing exercises 3x/day 7 days a week with use of written cues and pt/caregiver report.     Period Months   Status New     SLP SHORT TERM GOAL #2   Title Pt will demonstrate safe and efficient consumption of self regulated regular textures with use of compensatory strategies as needed.    Period Months   Status New            Plan - 06/23/16 2142    Clinical Impression Statement See below.   Speech Therapy Frequency Monthly   Duration --  ~6 visits   Treatment/Interventions Aspiration precaution training;Diet toleration management by SLP;SLP instruction and feedback;Patient/family education   Potential to Achieve Goals Good   Potential Considerations Pain level   SLP Home Exercise Plan Pt will be independent with HEP as assigned to faciliate carrover of treatment strategies and techniques in home environment   Consulted and Agree with Plan of Care Patient     Pt currently tolerates self regulated regular textures and thin liquids with mild discomfort and no overt signs or symptoms of aspiration. Oral motor assessment revealed WNL lingual ROM and WNL lingual strength. Labial ROM was WNL and strength was WNL. Velar ROM appeared WNL. POs: Pt currently without overt s/s aspiration. Thyroid elevation appeared reduced (pt with cervical adenopathy bilaterally), and swallows appeared timely. Oral residue noted as mild and cleared with liquid wash. Pt's swallow deemed WNL at this time.   Because data states the risk for dysphagia during and after radiation treatment is  high due to undergoing radiation tx, SLP taught pt about the possibility of reduced/limited ability for PO intake during rad tx. SLP encouraged pt to continue swallowing POs as far into rad tx as possible, even ingesting POs and/or completing HEP shortly after administration of pain meds.  SLP educated pt re: changes to swallowing musculature after rad tx, and why adherence to dysphagia HEP provided today and PO consumption was necessary to inhibit muscular disuse atrophy and to manage muscle fibrosis following rad tx. Pt demonstrated understanding of these things to SLP, however he does appear quite overwhelmed and will need additional support. Further education was provided re: xerostomia, mucositis/esophagitis, and late effects head/neck radiation.   Due to current functional swallow, adequate po intake, and pt visibly overwhelmed, SLP did not provide HEP with exercises at this time. SLP did review plan for exercises, however he was simply advised to focus on good oral hygiene habits (with a toothbrush to tongue and clean dentures) and to continue eating/drinking as tolerated for now. SLP also provided a typed list of his current medical care team. He appeared to not fully understand that he was getting a feeding tube and port on 13-Jan-2023 and why he was receiving. He also verbalized vaguely that he "might" quit smoking cigars ("They really don't seem to bother my thoat"). SLP emphasized that pt should call anyone on the care team should he have questions, concerns, or need support. Will plan to follow up with Pt after his first chemo cycle (can attempt to see in cancer center while receiving IV fluids). SLP also advised pt to help set up friends and family to help with driving to chemo/rad appointments should be feel unwell and unable to drive. He stated that he had not thought of this. At this time, no objective/instrumental assessment needed, however he may need one during/after treatment pending clinical  course. SLP will follow for 6-8 visits over the next 3 months.   Patient will benefit from skilled therapeutic intervention in order to improve the following deficits and impairments:   Dysphagia, oropharyngeal phase      G-Codes - 07-11-16 01/13/2144    Functional Assessment Tool Used clinical judgment   Functional Limitations Swallowing   Swallow Current Status [id number]) At least 1 percent but less than 20 percent impaired, limited or restricted   Swallow Goal Status [id number]) At least 1 percent but less than 20 percent impaired, limited or restricted      Problem List Patient Active Problem List   Diagnosis Date Noted  . 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 COPD (chronic obstructive pulmonary disease) ([employer]) 06/09/2013  . Tobacco abuse 09/29/2011  . HTN (hypertension) 09/29/2011   Thank you,  Genene Churn, Aurelia  [facility] Jul 11, 2016, 9:46 PM  [place] [address] [place], [place], [address] Phone: [phone]   Fax:  [phone]  Name: Bruce [name] MRN: [id number] Date of Birth: 1946/07/02

## 2016-06-24 NOTE — Progress Notes (Signed)
[facility] NOTE  Patient Care Team: [name], MD as PCP - General (Internal Medicine) Cristal Deer, DPM (Podiatry)  CHIEF COMPLAINTS/PURPOSE OF CONSULTATION:  Bilateral laryngeal carcinoma  HISTORY OF PRESENTING ILLNESS:  Bruce [name] 70 y.o. male is here for a consult due to bilateral laryngeal carcinoma.   In February, patient noticed his throat was feeling sore. He forgot to mention it to his PCP, Dr. Woody Seller, until July. Dr. Woody Seller then recommended he see a HEENT specialist, he was referred to Dr. Benjamine Mola. Per available records the patient noted a "knot" on the L side of his neck in early February then progressive throat discomfort, hoarseness, and a "knot" forming on his R neck. He has had no odynphagia or dysphagia. He smokes cigars and has done so for over 20 years. No chewing tobacco, no cigarettes.   Patient was seen by Dr. Benjamine Mola on 05/09/2016. He underwent a flexible fiberoptic laryngoscopy on 8/14, a L aryepiglottic fold mass was noted. Direct laryngoscopy with biopsy was recommended. Pathology report notes that biopsies of both the R and L aryepiglottic folds were taken and showed invasive poorly differentiated carcinoma.   Patient says his appetite is normal, although he has a lost a couple of pounds over the last few months. He takes Tramadol, but says it only alleviates pain temporarily.  He reports fatigue and difficulty walking since he had a stent put in his right leg. He notes no change in his sleeping. No nausea. No headaches.  Bruce [name] returns today to review recent PET imaging and for additional recommendations.     MEDICAL HISTORY:  Past Medical History:  Diagnosis Date  . AAA (abdominal aortic aneurysm) ([place])   . Anxiety   . Cancer (H[facility])    laryngeal  . Carpal tunnel syndrome   . Cellulitis   . COPD (chronic obstructive pulmonary disease) ([place])   . Epigastric hernia   . GERD (gastroesophageal reflux disease)   . Hyperlipidemia     . Hypertension     used to take amlodipine, none now    SURGICAL HISTORY: Past Surgical History:  Procedure Laterality Date  . APPENDECTOMY    . DIRECT LARYNGOSCOPY N/A 05/16/2016   Procedure: MICRO DIRECT LARYNGOSCOPY WITH BIOPSY;  Surgeon: Leta Baptist, MD;  Location: [facility];  Service: ENT;  Laterality: N/A;  . HEMORROIDECTOMY    . VASCULAR SURGERY Right 2008   stent to right common iliac artery    SOCIAL HISTORY: Social History   Social History  . Marital status: Widowed    Spouse name: N/A  . Number of children: N/A  . Years of education: N/A   Occupational History  . Not on file.   Social History Main Topics  . Smoking status: Current Every Day Smoker    Packs/day: 0.00    Years: 12.00    Types: Cigars  . Smokeless tobacco: Never Used     Comment: 5-7 cigars daily  . Alcohol use No  . Drug use: No  . Sexual activity: Not Currently   Other Topics Concern  . Not on file   Social History Narrative  . No narrative on file  Patient lives alone.  He has 2 daughters; one lives close to him and one lives in [place]. He has four grandchildren.  He smokes about 5 cigars day. He used to smoke cigarettes and chew tobacco when he was young. He used to drink alcohol until about 3-4 years ago  He worked in Museum/gallery conservator.  FAMILY HISTORY: Family History  Problem Relation Age of Onset  . Heart failure Mother   . Coronary artery disease Father   . Stroke Father   . Alcoholism Father   . Heart disease Brother     MI  4 sisters; brother died from massive heart attack  Mother deceased at 5  Father deceased at 29 from massive heart attack.  ALLERGIES:  has No Known Allergies.  MEDICATIONS:  Current Outpatient Prescriptions  Medication Sig Dispense Refill  . albuterol (PROVENTIL HFA;VENTOLIN HFA) 108 (90 Base) MCG/ACT inhaler Inhale 1 puff into the lungs every 6 (six) hours as needed for wheezing or shortness of breath.    Marland Kitchen atorvastatin (LIPITOR)  10 MG tablet Take 10 mg by mouth daily.  12  . Choline Fenofibrate (TRILIPIX) 135 MG capsule Take 135 mg by mouth daily.     Marland Kitchen HYDROcodone-acetaminophen (NORCO) 10-325 MG tablet Take 1 tablet by mouth every 4 (four) hours as needed. (Patient taking differently: Take 1 tablet by mouth every 4 (four) hours as needed for moderate pain. ) 60 tablet 0  . LORazepam (ATIVAN) 1 MG tablet Take 1 mg by mouth Nightly.      Marland Kitchen oxyCODONE-acetaminophen (PERCOCET/ROXICET) 5-325 MG tablet     . pantoprazole (PROTONIX) 40 MG tablet Take 40 mg by mouth 2 (two) times daily.  12  . traMADol (ULTRAM) 50 MG tablet Take 50 mg by mouth every 6 (six) hours as needed for moderate pain.     Marland Kitchen CISPLATIN IV Inject into the vein.    Marland Kitchen lidocaine-prilocaine (EMLA) cream Apply 1 application topically as needed. 30 g 3  . ondansetron (ZOFRAN) 8 MG tablet Take 1 tablet (8 mg total) by mouth every 8 (eight) hours as needed for nausea or vomiting. 30 tablet 2  . prochlorperazine (COMPAZINE) 10 MG tablet Take 1 tablet (10 mg total) by mouth every 6 (six) hours as needed for nausea or vomiting. 30 tablet 2   No current facility-administered medications for this visit.     Review of Systems  Constitutional: Positive for malaise/fatigue and weight loss.       Only a couple of a pounds  HENT: Positive for sore throat.        Hoarseness   Eyes: Negative.   Respiratory: Negative.   Cardiovascular: Negative.   Gastrointestinal: Negative.   Genitourinary: Negative.   Musculoskeletal: Negative.        Difficulty walking  Skin: Negative.   Neurological: Negative.   Endo/Heme/Allergies: Negative.   Psychiatric/Behavioral: Negative.   All other systems reviewed and are negative. 14 point ROS was done and is otherwise as detailed above or in HPI   PHYSICAL EXAMINATION: ECOG PERFORMANCE STATUS: 1 - Symptomatic but completely ambulatory  Vitals:   06/15/16 1616  BP: 125/64  Pulse: 76  Resp: 16  Temp: 99 F (37.2 C)   Filed  Weights   06/15/16 1616  Weight: 169 lb 4.8 oz (76.8 kg)     Physical Exam  Constitutional: He is oriented to person, place, and time and well-developed, well-nourished, and in no distress.  HENT:  Head: Normocephalic and atraumatic.  Nose: Nose normal.  Mouth/Throat: Oropharynx is clear and moist. No oropharyngeal exudate.  Eyes: Conjunctivae and EOM are normal. Pupils are equal, round, and reactive to light. Right eye exhibits no discharge. Left eye exhibits no discharge. No scleral icterus.  Neck: Normal range of motion. Neck supple. No tracheal deviation present. No thyromegaly  present.  Bilateral palpable neck adenopathy    Cardiovascular: Normal rate, regular rhythm and normal heart sounds.  Exam reveals no gallop and no friction rub.   No murmur heard. Pulmonary/Chest: Effort normal and breath sounds normal. He has no wheezes. He has no rales.  Abdominal: Soft. Bowel sounds are normal. He exhibits no distension and no mass. There is no tenderness. There is no rebound and no guarding.  Musculoskeletal: Normal range of motion. He exhibits no edema.  Lymphadenopathy:    He has cervical adenopathy.    He has no axillary adenopathy.  Neurological: He is alert and oriented to person, place, and time. He has normal reflexes. No cranial nerve deficit. Gait normal. Coordination normal.  Skin: Skin is warm and dry. No rash noted.  Psychiatric: Mood, memory, affect and judgment normal.  Nursing note and vitals reviewed.  LABORATORY DATA:  I have reviewed the data as listed Results for Bruce [name], Bruce [name] (MRN [id number]) as of 06/24/2016 23:55  Ref. Range 03/02/2016 10:32  Creatinine Latest Ref Range: 0.61 - 1.24 mg/dL 1.10    RADIOGRAPHIC STUDIES: I have personally reviewed the radiological images as listed and agreed with the findings in the report. No results found.  Study Result  CLINICAL DATA:  Initial treatment strategy for left aryepiglottic fold mass.  EXAM: NUCLEAR MEDICINE  PET SKULL BASE TO THIGH  TECHNIQUE: 8.4 mCi F-18 FDG was injected intravenously. Full-ring PET imaging was performed from the skull base to thigh after the radiotracer. CT data was obtained and used for attenuation correction and anatomic localization.  FASTING BLOOD GLUCOSE:  Value: 107 mg/dl  COMPARISON:  Chest abdomen and pelvic CT of 03/02/2016. No prior PET.  FINDINGS: NECK  Left-sided mass centered at the aryepiglottic fold and piriform sinus measures a S.U.V. max of 14.1, including on image 38/series 4.  Bilateral cervical nodal metastasis. A right-sided level IIa node measures 2.0 cm and a S.U.V. max of 13.3 on image 34/series 4.  Left level 5 node measures 1.5 cm and a S.U.V. max of 8.9 on image 26/series 4.  Right-sided supraclavicular/ level 4 node measures 1.0 cm and a S.U.V. max of 11.0 on image 41/ series 4.  CHEST  Bilateral upper lobe geographic pulmonary hypermetabolism corresponds to dependent pulmonary opacities which are progressive since 03/02/2016. No thoracic nodal hypermetabolism.  ABDOMEN/PELVIS  No abdominal pelvic nodal hypermetabolism. Note is made of left-sided prostatic hypermetabolism which measures a S.U.V. max of 6.9.  SKELETON  Right-sided latissimus muscular activity is favored to be due to motion after radiopharmaceutical injection. This measures a S.U.V. max of 2.9, including on approximately image 51/series 4.  CT IMAGES PERFORMED FOR ATTENUATION CORRECTION  Bilateral carotid atherosclerosis. Mild cardiomegaly. Multivessel coronary artery atherosclerosis. Moderate centrilobular and paraseptal emphysema. Right upper lobe 4 mm pulmonary nodule on image 71/ series 4 is unchanged.  A perifissural right upper lobe 9 mm pulmonary nodule on image 82/series 4. This is present back on 06/19/2009 per the 03/02/2016 report.  Right middle lobe 4 mm nodule on image 88/ series 4 is unchanged since the most recent  exam. Aortic stent graft repair. Mild prostatomegaly. 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  IMPRESSION: 1. Left-sided primary centered at the piriform sinus and aryepiglottic fold. 2. Bilateral cervical nodal metastasis. 3. No extracervical metastatic disease identified. 4. Bilateral pulmonary hypermetabolism is suspicious for developing interstitial lung disease (likely nonspecific interstitial pneumonia) superimposed upon centrilobular and paraseptal emphysema. Infectious process is felt less likely. 5. Hypermetabolic  focus in the left side of the prostate is nonspecific. Consider correlation with PSA level and physical exam. 6. Right latissimus muscular hypermetabolism is likely incidental but warrants followup attention.   Electronically Signed   By: Abigail Miyamoto M.D.   On: 06/14/2016 16:24   PATHOLOGY:    ASSESSMENT & PLAN:  Invasive squamous cell carcinoma of larynx Bilateral cervical adenopathy Tobacco use, cigars Peripheral Vascular disease Cancer related pain  We reviewed his PET/CT. I discussed with him that he needs to quit smoking.  I addressed the importance of smoking cessation with the patient in detail.  We discussed the health benefits of cessation.  We discussed the health detriments of ongoing tobacco use including but not limited to COPD, heart disease and malignancy. We reviewed the multiple options for cessation and I offered to refer him to smoking cessation classes. We discussed other alternatives to quit such as chantix, wellbutrin. We will continue to address this moving forward.  He will be referred for port a cath placement, PEG placement, chemotherapy teaching, XRT consultation, nutrition consultation and speech therapy consultation. I feel he will also need pulmonary evaluation as well. We will discuss this at follow-up.   I will see him back one week after C1D1.  WE discussed the importance of seeing [place] weekly,  making sure he lets [place] know of any difficulties he has during therapy. He has met with Anderson Malta our navigator and understands she is available to him to help through therapy.   All questions were answered. The patient knows to call the clinic with any problems, questions or concerns.  ORDERS PLACED FOR THIS ENCOUNTER: Orders Placed This Encounter  Procedures  . CBC with Differential  . Comprehensive metabolic panel  . Ambulatory referral to Speech Therapy   This document serves as a record of services personally performed by Ancil Linsey, MD. It was created on her behalf by Elmyra Ricks, a trained medical scribe. The creation of this record is based on the scribe's personal observations and the provider's statements to them. This document has been checked and approved by the attending provider.  I have reviewed the above documentation for accuracy and completeness, and I agree with the above.  This note was electronically signed.    Molli Hazard, MD  06/24/2016 11:53 PM

## 2016-06-25 NOTE — Progress Notes (Signed)
START ON PATHWAY REGIMEN - Head and Neck  HNOS300: Cisplatin 100 mg/m2 q21 Days x 3 Cycles with Concurrent Radiation   A cycle is every 21 days:     Cisplatin (Platinol(R)) 100 mg/m2 in 500 mL NS IV over 2 hours.  **Prehydrate and consider post-hydration.**** Dose Mod: None  **Always confirm dose/schedule in your pharmacy ordering system**    Patient Characteristics: Larynx, Stage III, IVA, IVB; Unresectable Disease Classification: Larynx AJCC N Stage: 2c AJCC T Stage: 2 Current Disease Status: No Distant Mets or Local Recurrence AJCC Stage Grouping: IVA AJCC M Stage: 0  Intent of Therapy: Curative Intent, Discussed with Patient

## 2016-06-27 ENCOUNTER — Ambulatory Visit (HOSPITAL_COMMUNITY): Payer: Medicare Other

## 2016-06-27 ENCOUNTER — Ambulatory Visit (HOSPITAL_COMMUNITY): Payer: Medicare Other | Admitting: Anesthesiology

## 2016-06-27 ENCOUNTER — Encounter (HOSPITAL_COMMUNITY): Payer: Self-pay | Admitting: Emergency Medicine

## 2016-06-27 ENCOUNTER — Encounter (HOSPITAL_COMMUNITY): Payer: Self-pay | Admitting: *Deleted

## 2016-06-27 ENCOUNTER — Ambulatory Visit (HOSPITAL_COMMUNITY)
Admission: RE | Admit: 2016-06-27 | Discharge: 2016-06-27 | Disposition: A | Discharge status: Home / Self Care | Payer: Medicare Other | Source: Ambulatory Visit | Attending: General Surgery | Admitting: General Surgery

## 2016-06-27 ENCOUNTER — Encounter (HOSPITAL_COMMUNITY)
Admission: RE | Disposition: A | Discharge status: Home / Self Care | Payer: Self-pay | Source: Ambulatory Visit | Attending: General Surgery

## 2016-06-27 DIAGNOSIS — Z79899 Other long term (current) drug therapy: Secondary | ICD-10-CM | POA: Diagnosis not present

## 2016-06-27 DIAGNOSIS — I739 Peripheral vascular disease, unspecified: Secondary | ICD-10-CM | POA: Insufficient documentation

## 2016-06-27 DIAGNOSIS — F17200 Nicotine dependence, unspecified, uncomplicated: Secondary | ICD-10-CM | POA: Diagnosis not present

## 2016-06-27 DIAGNOSIS — E7800 Pure hypercholesterolemia, unspecified: Secondary | ICD-10-CM | POA: Insufficient documentation

## 2016-06-27 DIAGNOSIS — C321 Malignant neoplasm of supraglottis: Secondary | ICD-10-CM | POA: Diagnosis not present

## 2016-06-27 DIAGNOSIS — Z95828 Presence of other vascular implants and grafts: Secondary | ICD-10-CM

## 2016-06-27 DIAGNOSIS — C329 Malignant neoplasm of larynx, unspecified: Secondary | ICD-10-CM | POA: Diagnosis present

## 2016-06-27 DIAGNOSIS — F419 Anxiety disorder, unspecified: Secondary | ICD-10-CM | POA: Insufficient documentation

## 2016-06-27 DIAGNOSIS — I700 Atherosclerosis of aorta: Secondary | ICD-10-CM | POA: Diagnosis not present

## 2016-06-27 DIAGNOSIS — J449 Chronic obstructive pulmonary disease, unspecified: Secondary | ICD-10-CM | POA: Diagnosis not present

## 2016-06-27 DIAGNOSIS — K219 Gastro-esophageal reflux disease without esophagitis: Secondary | ICD-10-CM | POA: Insufficient documentation

## 2016-06-27 HISTORY — PX: ESOPHAGOGASTRODUODENOSCOPY: SHX5428

## 2016-06-27 HISTORY — PX: PEG PLACEMENT: SHX5437

## 2016-06-27 HISTORY — PX: PORTACATH PLACEMENT: SHX2246

## 2016-06-27 SURGERY — INSERTION, TUNNELED CENTRAL VENOUS DEVICE, WITH PORT
Anesthesia: Monitor Anesthesia Care | Site: Chest

## 2016-06-27 MED ORDER — CEFAZOLIN SODIUM-DEXTROSE 2-4 GM/100ML-% IV SOLN
INTRAVENOUS | Status: AC
  Filled 2016-06-27: qty 100

## 2016-06-27 MED ORDER — TRAMADOL HCL 50 MG PO TABS: 50.0000 mg | ORAL_TABLET | Freq: Four times a day (QID) | ORAL | 0 refills | Status: DC | PRN

## 2016-06-27 MED ORDER — LIDOCAINE HCL (PF) 1 % IJ SOLN
INTRAMUSCULAR | Status: DC | PRN
  Administered 2016-06-27: 2 mL
  Administered 2016-06-27: 7 mL

## 2016-06-27 MED ORDER — GLYCOPYRROLATE 0.2 MG/ML IJ SOLN
0.2000 mg | Freq: Once | INTRAMUSCULAR | Status: AC | PRN
  Administered 2016-06-27: 0.2 mg via INTRAVENOUS

## 2016-06-27 MED ORDER — HEPARIN SOD (PORK) LOCK FLUSH 100 UNIT/ML IV SOLN
INTRAVENOUS | Status: AC
  Filled 2016-06-27: qty 5

## 2016-06-27 MED ORDER — MIDAZOLAM HCL 2 MG/2ML IJ SOLN
INTRAMUSCULAR | Status: AC
  Filled 2016-06-27: qty 2

## 2016-06-27 MED ORDER — FENTANYL CITRATE (PF) 100 MCG/2ML IJ SOLN
INTRAMUSCULAR | Status: AC
  Filled 2016-06-27: qty 2

## 2016-06-27 MED ORDER — ONDANSETRON HCL 4 MG/2ML IJ SOLN
INTRAMUSCULAR | Status: AC
  Filled 2016-06-27: qty 2

## 2016-06-27 MED ORDER — LIDOCAINE HCL (CARDIAC) 10 MG/ML IV SOLN
INTRAVENOUS | Status: DC | PRN
  Administered 2016-06-27: 50 mg via INTRAVENOUS

## 2016-06-27 MED ORDER — PROPOFOL 10 MG/ML IV BOLUS
INTRAVENOUS | Status: AC
  Filled 2016-06-27: qty 20

## 2016-06-27 MED ORDER — FENTANYL CITRATE (PF) 100 MCG/2ML IJ SOLN
INTRAMUSCULAR | Status: DC | PRN
  Administered 2016-06-27: 25 ug via INTRAVENOUS

## 2016-06-27 MED ORDER — LIDOCAINE HCL (PF) 1 % IJ SOLN
INTRAMUSCULAR | Status: AC
  Filled 2016-06-27: qty 5

## 2016-06-27 MED ORDER — MIDAZOLAM HCL 2 MG/2ML IJ SOLN
1.0000 mg | INTRAMUSCULAR | Status: DC | PRN
  Administered 2016-06-27: 2 mg via INTRAVENOUS

## 2016-06-27 MED ORDER — CHLORHEXIDINE GLUCONATE CLOTH 2 % EX PADS: 6.0000 | MEDICATED_PAD | Freq: Once | CUTANEOUS | Status: DC

## 2016-06-27 MED ORDER — FENTANYL CITRATE (PF) 100 MCG/2ML IJ SOLN
25.0000 ug | INTRAMUSCULAR | Status: DC | PRN
  Administered 2016-06-27: 25 ug via INTRAVENOUS

## 2016-06-27 MED ORDER — DEXAMETHASONE 4 MG PO TABS: ORAL_TABLET | ORAL | 1 refills | Status: DC

## 2016-06-27 MED ORDER — SODIUM CHLORIDE 0.9 % IV SOLN
INTRAVENOUS | Status: DC | PRN
  Administered 2016-06-27: 500 mL via INTRAMUSCULAR

## 2016-06-27 MED ORDER — ONDANSETRON HCL 4 MG/2ML IJ SOLN
4.0000 mg | Freq: Once | INTRAMUSCULAR | Status: AC
  Administered 2016-06-27: 4 mg via INTRAVENOUS

## 2016-06-27 MED ORDER — PROPOFOL 500 MG/50ML IV EMUL
INTRAVENOUS | Status: DC | PRN
  Administered 2016-06-27: 50 ug/kg/min via INTRAVENOUS

## 2016-06-27 MED ORDER — HYDROMORPHONE HCL 1 MG/ML IJ SOLN: 0.2500 mg | INTRAMUSCULAR | Status: DC | PRN

## 2016-06-27 MED ORDER — HEPARIN SOD (PORK) LOCK FLUSH 100 UNIT/ML IV SOLN
INTRAVENOUS | Status: DC | PRN
  Administered 2016-06-27: 500 [IU] via INTRAVENOUS

## 2016-06-27 MED ORDER — LIDOCAINE HCL (PF) 1 % IJ SOLN
INTRAMUSCULAR | Status: AC
  Filled 2016-06-27: qty 30

## 2016-06-27 MED ORDER — GLYCOPYRROLATE 0.2 MG/ML IJ SOLN
INTRAMUSCULAR | Status: AC
  Filled 2016-06-27: qty 1

## 2016-06-27 MED ORDER — CEFAZOLIN SODIUM-DEXTROSE 2-4 GM/100ML-% IV SOLN
2.0000 g | INTRAVENOUS | Status: AC
  Administered 2016-06-27: 2 g via INTRAVENOUS

## 2016-06-27 MED ORDER — KETOROLAC TROMETHAMINE 30 MG/ML IJ SOLN
30.0000 mg | Freq: Once | INTRAMUSCULAR | Status: AC
  Administered 2016-06-27: 30 mg via INTRAVENOUS
  Filled 2016-06-27: qty 1

## 2016-06-27 MED ORDER — LACTATED RINGERS IV SOLN
INTRAVENOUS | Status: DC
  Administered 2016-06-27: 1000 mL via INTRAVENOUS

## 2016-06-27 MED ORDER — BUTAMBEN-TETRACAINE-BENZOCAINE 2-2-14 % EX AERO
INHALATION_SPRAY | CUTANEOUS | Status: DC | PRN
  Administered 2016-06-27: 1 via TOPICAL

## 2016-06-27 MED ORDER — LIDOCAINE VISCOUS 2 % MT SOLN
OROMUCOSAL | Status: AC
  Filled 2016-06-27: qty 15

## 2016-06-27 MED ORDER — LIDOCAINE VISCOUS 2 % MT SOLN
3.0000 mL | OROMUCOSAL | Status: AC | PRN
  Administered 2016-06-27 (×2): 3 mL via OROMUCOSAL

## 2016-06-27 SURGICAL SUPPLY — 45 items
ADH SKN CLS APL DERMABOND .7 (GAUZE/BANDAGES/DRESSINGS) ×2
APPLIER CLIP 9.375 SM OPEN (CLIP)
APR CLP SM 9.3 20 MLT OPN (CLIP)
BAG DECANTER FOR FLEXI CONT (MISCELLANEOUS) ×4 IMPLANT
BAG HAMPER (MISCELLANEOUS) ×4 IMPLANT
BLOCK BITE 60FR ADLT L/F BLUE (MISCELLANEOUS) ×4 IMPLANT
CATH HICKMAN DUAL 12.0 (CATHETERS) IMPLANT
CHLORAPREP W/TINT 10.5 ML (MISCELLANEOUS) ×4 IMPLANT
CLIP APPLIE 9.375 SM OPEN (CLIP) IMPLANT
CLOTH BEACON ORANGE TIMEOUT ST (SAFETY) ×4 IMPLANT
COVER LIGHT HANDLE STERIS (MISCELLANEOUS) ×8 IMPLANT
DECANTER SPIKE VIAL GLASS SM (MISCELLANEOUS) ×4 IMPLANT
DERMABOND ADVANCED (GAUZE/BANDAGES/DRESSINGS) ×2
DERMABOND ADVANCED .7 DNX12 (GAUZE/BANDAGES/DRESSINGS) ×2 IMPLANT
DRAPE C-ARM FOLDED MOBILE STRL (DRAPES) ×4 IMPLANT
ELECT REM PT RETURN 9FT ADLT (ELECTROSURGICAL) ×4
ELECTRODE REM PT RTRN 9FT ADLT (ELECTROSURGICAL) ×2 IMPLANT
GLOVE BIOGEL PI IND STRL 7.0 (GLOVE) ×2 IMPLANT
GLOVE BIOGEL PI INDICATOR 7.0 (GLOVE) ×2
GLOVE SURG SS PI 7.5 STRL IVOR (GLOVE) ×4 IMPLANT
GOWN STRL REUS W/TWL LRG LVL3 (GOWN DISPOSABLE) ×12 IMPLANT
IV NS 500ML (IV SOLUTION) ×4
IV NS 500ML BAXH (IV SOLUTION) ×2 IMPLANT
KIT PORT POWER 8FR ISP MRI (Port) ×4 IMPLANT
KIT ROOM TURNOVER APOR (KITS) ×4 IMPLANT
MANIFOLD NEPTUNE II (INSTRUMENTS) ×4 IMPLANT
NDL HYPO 25X1 1.5 SAFETY (NEEDLE) ×2 IMPLANT
NEEDLE HYPO 25X1 1.5 SAFETY (NEEDLE) ×4 IMPLANT
PACK MINOR (CUSTOM PROCEDURE TRAY) ×4 IMPLANT
PAD ARMBOARD 7.5X6 YLW CONV (MISCELLANEOUS) ×4 IMPLANT
SET BASIN LINEN APH (SET/KITS/TRAYS/PACK) ×4 IMPLANT
SET INTRODUCER 12FR PACEMAKER (SHEATH) IMPLANT
SHEATH COOK PEEL AWAY SET 8F (SHEATH) IMPLANT
SUT PROLENE 3 0 PS 2 (SUTURE) IMPLANT
SUT VIC AB 3-0 SH 27 (SUTURE) ×4
SUT VIC AB 3-0 SH 27X BRD (SUTURE) ×2 IMPLANT
SUT VIC AB 4-0 PS2 27 (SUTURE) ×4 IMPLANT
SYR 20CC LL (SYRINGE) ×4 IMPLANT
SYR 50ML LL SCALE MARK (SYRINGE) IMPLANT
SYR CONTROL 10ML LL (SYRINGE) ×4 IMPLANT
TOWEL OR 17X26 4PK STRL BLUE (TOWEL DISPOSABLE) ×4 IMPLANT
TUBE ENDOVIVE SAFETY PEG KIT (TUBING) ×2 IMPLANT
TUBING ENDO SMARTCAP (MISCELLANEOUS) ×4 IMPLANT
TUBING IRRIGATION ENDOGATOR (MISCELLANEOUS) ×4 IMPLANT
WATER STERILE IRR 1000ML POUR (IV SOLUTION) IMPLANT

## 2016-06-27 NOTE — Progress Notes (Signed)
Chemotherapy schedule faxed to [facility] 

## 2016-06-27 NOTE — Anesthesia Procedure Notes (Signed)
Procedure Name: MAC Date/Time: 06/27/2016 9:24 AM Performed by: Vista Deck Pre-anesthesia Checklist: Patient identified, Emergency Drugs available, Suction available, Timeout performed and Patient being monitored Patient Re-evaluated:Patient Re-evaluated prior to inductionOxygen Delivery Method: Nasal Cannula

## 2016-06-27 NOTE — Discharge Instructions (Signed)
Care of a Feeding Tube People who have trouble swallowing or cannot take food or medicine by mouth are sometimes given feeding tubes. A feeding tube can go into the nose and down to the stomach or through the skin in the abdomen and into the stomach or small bowel. Some of the names of these feeding tubes are gastrostomy tubes, PEG lines, nasogastric tubes, and gastrojejunostomy tubes.  SUPPLIES NEEDED TO CARE FOR THE TUBE SITE  Clean gloves.  Clean wash cloth, gauze pads, or soft paper towel.  Cotton swabs.  Skin barrier ointment or cream.  Soap and water.  Pre-cut foam pads or gauze (that go around the tube).  Tube tape. TUBE SITE CARE 1. Have all supplies ready and available. 2. Wash hands well. 3. Put on clean gloves. 4. Remove the soiled foam pad or gauze, if present, that is found under the tube stabilizer. Change the foam pad or gauze daily or when soiled or moist. 5. Check the skin around the tube site for redness, rash, swelling, drainage, or extra tissue growth. If you notice any of these, call your caregiver. 6. Moisten gauze and cotton swabs with water and soap. 7. Wipe the area closest to the tube (right near the stoma) with cotton swabs. Wipe the surrounding skin with moistened gauze. Rinse with water. 8. Dry the skin and stoma site with a dry gauze pad or soft paper towel. Do not use antibiotic ointments at the tube site. 9. If the skin is red, apply a skin barrier cream or ointment (such as petroleum jelly) in a circular motion, using a cotton swab. The cream or ointment will provide a moisture barrier for the skin and helps with wound healing. 10. Apply a new pre-cut foam pad or gauze around the tube. Secure it with tape around the edges. If no drainage is present, foam pads or gauze may be left off. 11. Use tape or an anchoring device to fasten the feeding tube to the skin for comfort or as directed. Rotate where you tape the tube to avoid skin damage from the  adhesive. 12. Position the person in a semi-upright position (30-45 degree angle). 13. Throw away used supplies. 14. Remove gloves. 15. Wash hands. SUPPLIES NEEDED TO FLUSH A FEEDING TUBE  Clean gloves.  60 mL syringe (that connects to the feeding tube).  Towel.  Water. FLUSHING A FEEDING TUBE  1. Have all supplies ready and available. 2. Wash hands well. 3. Put on clean gloves. 4. Draw up 30 mL of water in the syringe. 5. Kink the feeding tube while disconnecting it from the feeding-bag tubing or while removing the plug at the end of the tube. Kinking closes the tube and prevents secretions in the tube from spilling out. 6. Insert the tip of the syringe into the end of the feeding tube. Release the kink. Slowly inject the water. 7. If unable to inject the water, the person with the feeding tube should lay on his or her left side. The tip of the tube may be against the stomach wall, blocking fluid flow. Changing positions may move the tip away from the stomach wall. After repositioning, try injecting the water again. 8. After injecting the water, remove the syringe. 9. Always flush before giving the first medicine, between medicines, and after the final medicine before starting a feeding. This prevents medicines from clogging the tube. 10. Throw away used supplies. 11. Remove gloves. 12. Wash hands.   This information is not intended to replace   advice given to you by your health care provider. Make sure you discuss any questions you have with your health care provider.   Document Released: 09/12/2005 Document Revised: 08/29/2012 Document Reviewed: 04/26/2012 Elsevier Interactive Patient Education 2016 [place] Insertion, Care After Refer to this sheet in the next few weeks. These instructions provide you with information on caring for yourself after your procedure. Your health care provider may also give you more specific instructions. Your treatment has been  planned according to current medical practices, but problems sometimes occur. Call your health care provider if you have any problems or questions after your procedure. WHAT TO EXPECT AFTER THE PROCEDURE After your procedure, it is typical to have the following:   Discomfort at the port insertion site. Ice packs to the area will help.  Bruising on the skin over the port. This will subside in 3-4 days. HOME CARE INSTRUCTIONS  After your port is placed, you will get a manufacturer's information card. The card has information about your port. Keep this card with you at all times.   Know what kind of port you have. There are many types of ports available.   Wear a medical alert bracelet in case of an emergency. This can help alert health care workers that you have a port.   The port can stay in for as long as your health care provider believes it is necessary.   A home health care nurse may give medicines and take care of the port.   You or a family member can get special training and directions for giving medicine and taking care of the port at home.  SEEK MEDICAL CARE IF:   Your port does not flush or you are unable to get a blood return.   You have a fever or chills. SEEK IMMEDIATE MEDICAL CARE IF:  You have new fluid or pus coming from your incision.   You notice a bad smell coming from your incision site.   You have swelling, pain, or more redness at the incision or port site.   You have chest pain or shortness of breath.   This information is not intended to replace advice given to you by your health care provider. Make sure you discuss any questions you have with your health care provider.   Document Released: 07/03/2013 Document Revised: 09/17/2013 Document Reviewed: 07/03/2013 Elsevier Interactive Patient Education 2016 [place] An implanted port is a type of central line that is placed under the skin. Central lines are used to  provide IV access when treatment or nutrition needs to be given through a person's veins. Implanted ports are used for long-term IV access. An implanted port may be placed because:   You need IV medicine that would be irritating to the small veins in your hands or arms.   You need long-term IV medicines, such as antibiotics.   You need IV nutrition for a long period.   You need frequent blood draws for lab tests.   You need dialysis.  Implanted ports are usually placed in the chest area, but they can also be placed in the upper arm, the abdomen, or the leg. An implanted port has two main parts:   Reservoir. The reservoir is round and will appear as a small, raised area under your skin. The reservoir is the part where a needle is inserted to give medicines or draw blood.   Catheter. The catheter is a thin, flexible tube that  extends from the reservoir. The catheter is placed into a large vein. Medicine that is inserted into the reservoir goes into the catheter and then into the vein.  HOW WILL I CARE FOR MY INCISION SITE? Do not get the incision site wet. Bathe or shower as directed by your health care provider.  HOW IS MY PORT ACCESSED? Special steps must be taken to access the port:   Before the port is accessed, a numbing cream can be placed on the skin. This helps numb the skin over the port site.   Your health care provider uses a sterile technique to access the port.  Your health care provider must put on a mask and sterile gloves.  The skin over your port is cleaned carefully with an antiseptic and allowed to dry.  The port is gently pinched between sterile gloves, and a needle is inserted into the port.  Only "non-coring" port needles should be used to access the port. Once the port is accessed, a blood return should be checked. This helps ensure that the port is in the vein and is not clogged.   If your port needs to remain accessed for a constant infusion, a clear  (transparent) bandage will be placed over the needle site. The bandage and needle will need to be changed every week, or as directed by your health care provider.   Keep the bandage covering the needle clean and dry. Do not get it wet. Follow your health care provider's instructions on how to take a shower or bath while the port is accessed.   If your port does not need to stay accessed, no bandage is needed over the port.  WHAT IS FLUSHING? Flushing helps keep the port from getting clogged. Follow your health care provider's instructions on how and when to flush the port. Ports are usually flushed with saline solution or a medicine called heparin. The need for flushing will depend on how the port is used.   If the port is used for intermittent medicines or blood draws, the port will need to be flushed:   After medicines have been given.   After blood has been drawn.   As part of routine maintenance.   If a constant infusion is running, the port may not need to be flushed.  HOW LONG WILL MY PORT STAY IMPLANTED? The port can stay in for as long as your health care provider thinks it is needed. When it is time for the port to come out, surgery will be done to remove it. The procedure is similar to the one performed when the port was put in.  WHEN SHOULD I SEEK IMMEDIATE MEDICAL CARE? When you have an implanted port, you should seek immediate medical care if:   You notice a bad smell coming from the incision site.   You have swelling, redness, or drainage at the incision site.   You have more swelling or pain at the port site or the surrounding area.   You have a fever that is not controlled with medicine.   This information is not intended to replace advice given to you by your health care provider. Make sure you discuss any questions you have with your health care provider.   Document Released: 09/12/2005 Document Revised: 07/03/2013 Document Reviewed: 05/20/2013 Elsevier  Interactive Patient Education [employer].

## 2016-06-27 NOTE — Interval H&P Note (Signed)
History and Physical Interval Note:  06/27/2016 8:17 AM  Bruce [name]  has presented today for surgery, with the diagnosis of larangal cancer  The various methods of treatment have been discussed with the patient and family. After consideration of risks, benefits and other options for treatment, the patient has consented to  Procedure(s): INSERTION PORT-A-CATH (N/A) PERCUTANEOUS ENDOSCOPIC GASTROSTOMY (PEG) PLACEMENT (N/A) ESOPHAGOGASTRODUODENOSCOPY (EGD) (N/A) as a surgical intervention .  The patient's history has been reviewed, patient examined, no change in status, stable for surgery.  I have reviewed the patient's chart and labs.  Questions were answered to the patient's satisfaction.     Aviva Signs A

## 2016-06-27 NOTE — Anesthesia Procedure Notes (Signed)
Procedure Name: MAC Date/Time: 06/27/2016 8:38 AM Performed by: Vista Deck Pre-anesthesia Checklist: Patient identified, Emergency Drugs available, Suction available, Timeout performed and Patient being monitored Patient Re-evaluated:Patient Re-evaluated prior to inductionOxygen Delivery Method: Non-rebreather mask

## 2016-06-27 NOTE — Op Note (Signed)
Patient:  Bruce [name]  DOB:  07-Mar-1946  MRN:  [id number]   Preop Diagnosis:  Laryngeal carcinoma  Postop Diagnosis:  Same  Procedure:  Port-A-Cath insertion, EGD with PEG placement  Surgeon:  Aviva Signs, M.D.  Anes:  Mac  Indications:  Patient is a 70 year old white male who was recently diagnosed with laryngeal carcinoma who is about to undergo radiation and chemotherapy. The risks and benefits of the procedures including bleeding, infection, bowel injury, and the possibility of a pneumothorax were fully explained to the patient, who gave informed consent.  Procedure note:  The patient was placed in the Trendelenburg position after monitored anesthesia care was given. The left upper chest was prepped and draped using usual sterile technique with DuraPrep. Surgical site confirmation was performed. 1% Xylocaine was used for local anesthesia.  An incision was made below the left clavicle. A subcutaneous pocket was formed. A needle is advanced into the left subclavian vein using the Seldinger technique without difficulty. A guidewire was then advanced into the right atrium under fluoroscopic guidance. An introducer and peel-away sheath were placed over the guidewire. The catheter was then inserted through the peel-away sheath the peel-away sheath was removed. The catheter was then attached to the port and the port placed in subcutaneous pocket. Adequate positioning was confirmed by fluoroscopy. Good backflow blood was noted in the port. The port was flushed with heparin flush. A power port was inserted. The subcutaneous layer was reapproximated using a 3-0 Vicryl interrupted suture. The skin was closed using a 4-0 Vicryl subcuticular suture. Dermabond was then applied.  All tape and needle counts were correct at the end of the procedure.  Next, we proceeded with EGD and PEG placement. Cetacaine spray was used on the oropharynx. The endoscopes advance to to the first portion the duodenum  without difficulty. No strictures were noted. No frank ulcerations were noted. Transabdominal palpation was performed in order to find a position in the antrum. 1% Xylocaine was used for local anesthesia. A small incision was made in the epigastrium of the abdomen and the needle catheter was advanced into the antrum under direct visualization without difficulty. A guidewire was then advanced into the stomach and this was grasped using a snare. The endoscope was then retracted along with the guidewire. A 20 French gastrostomy tube was then attached to the wire and using the pull technique, the gastrostomy tube was positioned in the stomach. There was placed at the 2 cm Gerald Honea at the skin level. The endoscopes advance back down into the stomach and confirmation of appropriate position was noted. The endoscope was then removed after evacuation of air. A bolster was placed at the 2 cm Emanuelle Bastos of the gastrostomy tube. Troponin poor ointment was applied at the exit site.  All tape and needle counts were correct at the end of both procedures. The patient was transferred to PACU in stable condition.  Complications:  None  EBL:  Minimal  Specimen:  None

## 2016-06-27 NOTE — Anesthesia Preprocedure Evaluation (Signed)
Anesthesia Evaluation  Patient identified by MRN, date of birth, ID band Patient awake    Reviewed: Allergy & Precautions, NPO status , Patient's Chart, lab work & pertinent test results  Airway Mallampati: II  TM Distance: >3 FB     Dental  (+) Edentulous Upper, Edentulous Lower   Pulmonary COPD, Current Smoker,  Laryngeal cancer. No stridor or SOB at this time.   breath sounds clear to auscultation       Cardiovascular hypertension, Pt. on medications  Rhythm:Regular Rate:Normal     Neuro/Psych PSYCHIATRIC DISORDERS Anxiety    GI/Hepatic GERD  ,  Endo/Other    Renal/GU      Musculoskeletal   Abdominal   Peds  Hematology   Anesthesia Other Findings   Reproductive/Obstetrics                             Anesthesia Physical Anesthesia Plan  ASA: IV  Anesthesia Plan: MAC   Post-op Pain Management:    Induction: Intravenous  Airway Management Planned: Simple Face Mask  Additional Equipment:   Intra-op Plan:   Post-operative Plan:   Informed Consent: I have reviewed the patients History and Physical, chart, labs and discussed the procedure including the risks, benefits and alternatives for the proposed anesthesia with the patient or authorized representative who has indicated his/her understanding and acceptance.     Plan Discussed with: CRNA and Anesthesiologist  Anesthesia Plan Comments:         Anesthesia Quick Evaluation

## 2016-06-27 NOTE — Anesthesia Postprocedure Evaluation (Signed)
Anesthesia Post Note  Patient: Bruce [name]  Procedure(s) Performed: Procedure(s) (LRB): INSERTION PORT-A-CATH (Left) PERCUTANEOUS ENDOSCOPIC GASTROSTOMY (PEG) PLACEMENT (N/A) ESOPHAGOGASTRODUODENOSCOPY (EGD) (N/A)  Patient location during evaluation: PACU Anesthesia Type: MAC Level of consciousness: awake and alert Pain management: pain level controlled Vital Signs Assessment: post-procedure vital signs reviewed and stable Respiratory status: spontaneous breathing and patient connected to nasal cannula oxygen Cardiovascular status: stable Anesthetic complications: no    Last Vitals:  Vitals:   06/27/16 0835 06/27/16 0930  BP: 95/60 140/80  Pulse:  72  Resp: 15   Temp:  36.6 C    Last Pain:  Vitals:   06/27/16 0737  TempSrc: Oral                 Georges Victorio

## 2016-06-27 NOTE — Transfer of Care (Signed)
Immediate Anesthesia Transfer of Care Note  Patient: Bruce [name]  Procedure(s) Performed: Procedure(s): INSERTION PORT-A-CATH (Left) PERCUTANEOUS ENDOSCOPIC GASTROSTOMY (PEG) PLACEMENT (N/A) ESOPHAGOGASTRODUODENOSCOPY (EGD) (N/A)  Patient Location: PACU  Anesthesia Type:MAC  Level of Consciousness: awake and patient cooperative  Airway & Oxygen Therapy: Patient Spontanous Breathing and Patient connected to nasal cannula oxygen  Post-op Assessment: Report given to RN  Post vital signs: Reviewed  Last Vitals:  Vitals:   06/27/16 0830 06/27/16 0835  BP: 101/66 95/60  Resp: (!) 26 15  Temp:      Last Pain:  Vitals:   06/27/16 0737  TempSrc: Oral      Patients Stated Pain Goal: 8 ([id number])  Complications: No apparent anesthesia complications

## 2016-06-28 ENCOUNTER — Telehealth (HOSPITAL_COMMUNITY): Payer: Self-pay | Admitting: Emergency Medicine

## 2016-06-28 ENCOUNTER — Encounter: Payer: Self-pay | Admitting: Dietician

## 2016-06-28 DIAGNOSIS — C329 Malignant neoplasm of larynx, unspecified: Secondary | ICD-10-CM

## 2016-06-28 MED ORDER — OSMOLITE 1.5 CAL PO LIQD: 237.0000 mL | ORAL | Status: AC

## 2016-06-28 NOTE — Telephone Encounter (Signed)
Called to check on pt to see how he was doing after he had his port and feeding tube placed yesterday.  He said he was doing good.  Told pt that I had called in steroids that pt would start taking after chemotherapy.  Pt verbalized understanding.

## 2016-06-28 NOTE — Progress Notes (Signed)
Encounter for TF order placement. MD to cosign. Spoke with [facility] liaison who will follow up with pt.   [name] RD, LDN, CNSC Clinical Nutrition Pager: [phone]  06/28/2016 11:48 AM

## 2016-06-29 ENCOUNTER — Ambulatory Visit (HOSPITAL_COMMUNITY): Payer: Medicare Other

## 2016-06-29 ENCOUNTER — Other Ambulatory Visit (HOSPITAL_COMMUNITY): Payer: Self-pay | Admitting: Emergency Medicine

## 2016-06-29 ENCOUNTER — Other Ambulatory Visit (HOSPITAL_COMMUNITY): Payer: Self-pay | Admitting: Hematology & Oncology

## 2016-07-01 ENCOUNTER — Encounter (HOSPITAL_COMMUNITY): Payer: Medicare Other | Attending: Hematology & Oncology

## 2016-07-01 ENCOUNTER — Encounter: Payer: Self-pay | Admitting: Dietician

## 2016-07-01 ENCOUNTER — Encounter (HOSPITAL_COMMUNITY): Payer: Self-pay

## 2016-07-01 VITALS — BP 133/72 | HR 70 | Temp 98.0°F | Resp 16 | Wt 167.8 lb

## 2016-07-01 DIAGNOSIS — Z5111 Encounter for antineoplastic chemotherapy: Secondary | ICD-10-CM

## 2016-07-01 DIAGNOSIS — C321 Malignant neoplasm of supraglottis: Secondary | ICD-10-CM | POA: Diagnosis not present

## 2016-07-01 LAB — CBC WITH DIFFERENTIAL/PLATELET
Basophils Absolute: 0.1 10*3/uL (ref 0.0–0.1)
Basophils Relative: 1 %
Eosinophils Absolute: 0.6 10*3/uL (ref 0.0–0.7)
Eosinophils Relative: 4 %
HEMATOCRIT: 45 % (ref 39.0–52.0)
HEMOGLOBIN: 15.6 g/dL (ref 13.0–17.0)
LYMPHS ABS: 5.6 10*3/uL — AB (ref 0.7–4.0)
Lymphocytes Relative: 37 %
MCH: 30.8 pg (ref 26.0–34.0)
MCHC: 34.7 g/dL (ref 30.0–36.0)
MCV: 88.9 fL (ref 78.0–100.0)
MONO ABS: 1.1 10*3/uL — AB (ref 0.1–1.0)
MONOS PCT: 7 %
Neutro Abs: 7.8 10*3/uL — ABNORMAL HIGH (ref 1.7–7.7)
Neutrophils Relative %: 51 %
Platelets: 329 10*3/uL (ref 150–400)
RBC: 5.06 MIL/uL (ref 4.22–5.81)
RDW: 15 % (ref 11.5–15.5)
WBC: 15.1 10*3/uL — AB (ref 4.0–10.5)

## 2016-07-01 LAB — COMPREHENSIVE METABOLIC PANEL
ALK PHOS: 46 U/L (ref 38–126)
ALT: 10 U/L — ABNORMAL LOW (ref 17–63)
ANION GAP: 8 (ref 5–15)
AST: 19 U/L (ref 15–41)
Albumin: 3.7 g/dL (ref 3.5–5.0)
BILIRUBIN TOTAL: 0.6 mg/dL (ref 0.3–1.2)
BUN: 19 mg/dL (ref 6–20)
CALCIUM: 10.1 mg/dL (ref 8.9–10.3)
CO2: 24 mmol/L (ref 22–32)
CREATININE: 0.99 mg/dL (ref 0.61–1.24)
Chloride: 104 mmol/L (ref 101–111)
GLUCOSE: 132 mg/dL — AB (ref 65–99)
Potassium: 3.4 mmol/L — ABNORMAL LOW (ref 3.5–5.1)
Sodium: 136 mmol/L (ref 135–145)
Total Protein: 7.6 g/dL (ref 6.5–8.1)

## 2016-07-01 LAB — MAGNESIUM: Magnesium: 1.6 mg/dL — ABNORMAL LOW (ref 1.7–2.4)

## 2016-07-01 MED ORDER — HEPARIN SOD (PORK) LOCK FLUSH 100 UNIT/ML IV SOLN
500.0000 [IU] | Freq: Once | INTRAVENOUS | Status: AC | PRN
  Administered 2016-07-01: 500 [IU]
  Filled 2016-07-01: qty 5

## 2016-07-01 MED ORDER — PALONOSETRON HCL INJECTION 0.25 MG/5ML
0.2500 mg | Freq: Once | INTRAVENOUS | Status: AC
  Administered 2016-07-01: 0.25 mg via INTRAVENOUS
  Filled 2016-07-01: qty 5

## 2016-07-01 MED ORDER — SODIUM CHLORIDE 0.9 % IV SOLN
Freq: Once | INTRAVENOUS | Status: AC
  Administered 2016-07-01: 11:00:00 via INTRAVENOUS
  Filled 2016-07-01: qty 5

## 2016-07-01 MED ORDER — SODIUM CHLORIDE 0.9 % IV SOLN
INTRAVENOUS | Status: DC
  Administered 2016-07-01 (×2): via INTRAVENOUS

## 2016-07-01 MED ORDER — MANNITOL 25 % IV SOLN
Freq: Once | INTRAVENOUS | Status: AC
  Administered 2016-07-01: 09:00:00 via INTRAVENOUS
  Filled 2016-07-01: qty 10

## 2016-07-01 MED ORDER — SODIUM CHLORIDE 0.9% FLUSH
10.0000 mL | INTRAVENOUS | Status: DC | PRN
  Administered 2016-07-01: 10 mL
  Filled 2016-07-01: qty 10

## 2016-07-01 MED ORDER — CISPLATIN CHEMO INJECTION 100MG/100ML
100.0000 mg/m2 | Freq: Once | INTRAVENOUS | Status: AC
  Administered 2016-07-01: 191 mg via INTRAVENOUS
  Filled 2016-07-01: qty 191

## 2016-07-01 NOTE — Progress Notes (Signed)
Tolerated chemo well. Stable and ambulatory on discharge home with daughter. 

## 2016-07-01 NOTE — Progress Notes (Signed)
Follow up with H&N patient who is s/p port/PEG. Today is day 1 of chemo. He starts radiation Monday  Contacted Pt by visiting during his infusion. Daughter was also present   Wt Readings from Last 10 Encounters:  07/01/16 167 lb 12.8 oz (76.1 kg)  06/23/16 168 lb (76.2 kg)  06/15/16 169 lb 4.8 oz (76.8 kg)  06/09/16 168 lb 6.4 oz (76.4 kg)  05/16/16 170 lb (77.1 kg)  02/24/16 173 lb 6.4 oz (78.7 kg)  06/21/11 165 lb (74.8 kg)  His weight is stable.  Pt reports that he has received his TF formula. He has not yet has [facility] teaching on it.   He denies any problems with his PEG. It is not sore. His daughter, who is also present, says that she and the other daughter have been coming to his house to flush it a few times a day.   Daughter was not present at the first encounter and had questions about the TF regimen, the timing of when he feeds, how long he needs to be upright after feeding, how much he should flush, if his current regimen is providing enough fluid, when he should stop eating drinking, etc  We discussed how H&N patients have a very busy schedule with radiation, chemotherapy, scheduled IVF as well as office visits. He is told that he if he is going to be out most of the day, to bring his TF and syringe with him, There should be absolutely no problem with him administering TF while he is receiving IVF or after his radiation treatment in the bathroom or patient room.   He should remain upright atleast 20-30 mintues after feeding. Acknowledged that this is a guideline, and some individuals who have slower gastric emptying, smaller stomachs or reflux should stay upright longer.   Again discussed that at this time, pt only needs to administer 1/2 to 1 can 4x a day to get into the habit of using his tube. He should be eating orally as long as able  Again discussed the dangers of dehydration with cisplatin and how pt needs to makes sure he is infusing his flushes. Directed that if pt is  unable to infuse 2 cans and 240 cc free water at the 4 feeds, to decrease the flushes before and after to 60 cc and infuse the days excess 480 cc worth of flushes at other times during the day.   Daughter asked when he should stop eating/drinking. Pt is to continue eating/drinking until he starts to develop s/s of dysphagia such as coughing or gagging with intake. Once this occurs he is directed NOT to take any PO intake as even water can be aspirated with oral bacteria. All TF and water should be given via PEG at that time.   Pt himself did not speak much at all and was somnolent. He seems to be very overwhelmed. Thankfully, the daughters seem very involved in his care. They are again told that RD will be habitually checking in on patient and they can always call RD with questions.   RD spoke with [facility] representative. She states he has appointment for [facility] on 10/9.   [name] RD, LDN, [place] [facility] Pager: [phone] 07/01/2016 11:28 AM

## 2016-07-01 NOTE — Progress Notes (Signed)
Patient received chemotherapy today, patient tolerated it well, no problems.

## 2016-07-04 ENCOUNTER — Encounter (HOSPITAL_COMMUNITY)
Admission: RE | Admit: 2016-07-04 | Discharge: 2016-07-04 | Disposition: A | Discharge status: Home / Self Care | Payer: Medicare Other | Source: Ambulatory Visit | Attending: Hematology & Oncology | Admitting: Hematology & Oncology

## 2016-07-04 ENCOUNTER — Telehealth (HOSPITAL_COMMUNITY): Payer: Self-pay

## 2016-07-04 DIAGNOSIS — C131 Malignant neoplasm of aryepiglottic fold, hypopharyngeal aspect: Secondary | ICD-10-CM | POA: Diagnosis not present

## 2016-07-04 MED ORDER — SODIUM CHLORIDE 0.9 % IV SOLN
INTRAVENOUS | Status: DC
  Administered 2016-07-04: 1000 mL via INTRAVENOUS

## 2016-07-04 MED ORDER — HEPARIN SOD (PORK) LOCK FLUSH 100 UNIT/ML IV SOLN
INTRAVENOUS | Status: AC
Start: 2016-07-04 — End: 2016-07-04
  Filled 2016-07-04: qty 5

## 2016-07-04 MED ORDER — HEPARIN SOD (PORK) LOCK FLUSH 100 UNIT/ML IV SOLN
500.0000 [IU] | Freq: Once | INTRAVENOUS | Status: AC
  Administered 2016-07-04: 500 [IU] via INTRAVENOUS

## 2016-07-04 NOTE — Telephone Encounter (Signed)
24 hour follow up-Patient stated that he had been feeling nauseated off and on this weekend and today. He stated that he is taking his nausea meds, they are helping some. He has not vomited, is able to drink and eat some. No fever or chills noted.

## 2016-07-06 ENCOUNTER — Encounter (HOSPITAL_COMMUNITY)
Admission: RE | Admit: 2016-07-06 | Discharge: 2016-07-06 | Disposition: A | Discharge status: Home / Self Care | Payer: Medicare Other | Source: Ambulatory Visit | Attending: Hematology & Oncology | Admitting: Hematology & Oncology

## 2016-07-06 DIAGNOSIS — C131 Malignant neoplasm of aryepiglottic fold, hypopharyngeal aspect: Secondary | ICD-10-CM | POA: Diagnosis not present

## 2016-07-06 MED ORDER — HEPARIN SOD (PORK) LOCK FLUSH 100 UNIT/ML IV SOLN
INTRAVENOUS | Status: AC
  Filled 2016-07-06: qty 5

## 2016-07-06 MED ORDER — SODIUM CHLORIDE 0.9 % IV SOLN
INTRAVENOUS | Status: DC
  Administered 2016-07-06: 1000 mL via INTRAVENOUS

## 2016-07-06 MED ORDER — HEPARIN SOD (PORK) LOCK FLUSH 100 UNIT/ML IV SOLN
500.0000 [IU] | INTRAVENOUS | Status: AC | PRN
  Administered 2016-07-06: 500 [IU]

## 2016-07-08 ENCOUNTER — Encounter (HOSPITAL_COMMUNITY)
Admission: RE | Admit: 2016-07-08 | Discharge: 2016-07-08 | Disposition: A | Discharge status: Home / Self Care | Payer: Medicare Other | Source: Ambulatory Visit | Attending: Hematology & Oncology | Admitting: Hematology & Oncology

## 2016-07-08 ENCOUNTER — Encounter (HOSPITAL_COMMUNITY): Payer: Self-pay

## 2016-07-08 DIAGNOSIS — C131 Malignant neoplasm of aryepiglottic fold, hypopharyngeal aspect: Secondary | ICD-10-CM | POA: Diagnosis not present

## 2016-07-08 MED ORDER — HEPARIN SOD (PORK) LOCK FLUSH 100 UNIT/ML IV SOLN
500.0000 [IU] | INTRAVENOUS | Status: AC | PRN
  Administered 2016-07-08: 500 [IU]
  Filled 2016-07-08: qty 5

## 2016-07-08 MED ORDER — SODIUM CHLORIDE 0.9 % IV SOLN
INTRAVENOUS | Status: DC
  Administered 2016-07-08: 13:00:00 via INTRAVENOUS

## 2016-07-11 ENCOUNTER — Encounter (HOSPITAL_COMMUNITY): Payer: Self-pay

## 2016-07-11 ENCOUNTER — Encounter (HOSPITAL_COMMUNITY)
Admission: RE | Admit: 2016-07-11 | Discharge: 2016-07-11 | Disposition: A | Discharge status: Home / Self Care | Payer: Medicare Other | Source: Ambulatory Visit | Attending: Hematology & Oncology | Admitting: Hematology & Oncology

## 2016-07-11 DIAGNOSIS — C131 Malignant neoplasm of aryepiglottic fold, hypopharyngeal aspect: Secondary | ICD-10-CM | POA: Diagnosis not present

## 2016-07-11 MED ORDER — SODIUM CHLORIDE 0.9 % IV SOLN
INTRAVENOUS | Status: DC
  Administered 2016-07-11: 12:00:00 via INTRAVENOUS

## 2016-07-11 MED ORDER — HEPARIN SOD (PORK) LOCK FLUSH 100 UNIT/ML IV SOLN
500.0000 [IU] | INTRAVENOUS | Status: AC | PRN
  Administered 2016-07-11: 500 [IU]
  Filled 2016-07-11: qty 5

## 2016-07-12 ENCOUNTER — Encounter (HOSPITAL_BASED_OUTPATIENT_CLINIC_OR_DEPARTMENT_OTHER): Payer: Medicare Other | Admitting: Hematology & Oncology

## 2016-07-12 ENCOUNTER — Encounter (HOSPITAL_COMMUNITY): Payer: Self-pay | Admitting: Hematology & Oncology

## 2016-07-12 VITALS — BP 108/64 | HR 82 | Temp 97.9°F | Resp 16 | Wt 166.8 lb

## 2016-07-12 DIAGNOSIS — K5903 Drug induced constipation: Secondary | ICD-10-CM

## 2016-07-12 DIAGNOSIS — C321 Malignant neoplasm of supraglottis: Secondary | ICD-10-CM

## 2016-07-12 DIAGNOSIS — K5900 Constipation, unspecified: Secondary | ICD-10-CM | POA: Diagnosis not present

## 2016-07-12 DIAGNOSIS — I739 Peripheral vascular disease, unspecified: Secondary | ICD-10-CM

## 2016-07-12 DIAGNOSIS — G893 Neoplasm related pain (acute) (chronic): Secondary | ICD-10-CM

## 2016-07-12 DIAGNOSIS — R599 Enlarged lymph nodes, unspecified: Secondary | ICD-10-CM | POA: Diagnosis not present

## 2016-07-12 DIAGNOSIS — Z720 Tobacco use: Secondary | ICD-10-CM

## 2016-07-12 MED ORDER — HYDROCODONE-ACETAMINOPHEN 7.5-325 MG/15ML PO SOLN: 15.0000 mL | ORAL | 0 refills | Status: DC | PRN

## 2016-07-12 MED ORDER — MAGIC MOUTHWASH W/LIDOCAINE: ORAL | 1 refills | Status: DC

## 2016-07-12 NOTE — Progress Notes (Signed)
[place] NOTE  Patient Care Team: [name], MD as PCP - General (Internal Medicine) Cristal Deer, DPM (Podiatry)  CHIEF COMPLAINTS/PURPOSE OF CONSULTATION:  Bilateral laryngeal carcinoma  HISTORY OF PRESENTING ILLNESS:  Bruce [name] 70 y.o. male is here for a follow up of bilateral laryngeal carcinoma.   Patient reports fatigue and difficulty sleeping. He wakes up throughout the night. Bruce [name] says nothing in particular is causing him to wake up. He experiences some pain, but is taking pain medication. He denies depression, notes however that his mood is at times up and down.   Patient is experiencing drainage from his feeding tube.  He says he puts a whole can a food and it over half of it drains out. Currently he is able to eat by mouth and notes that he does not need to rely on his feedint tube.   Patient is beginning to experience a sore throat from radiation. He does not have/use magic mouthwash. He is also experiencing constipation.   Bruce [name] states he has cut back on smoking. He used to smoke 5-6 cigars a day and now he states he smokes 3-4.  He does not need a refill for any medication.  Patient has had the flu shot this year.    MEDICAL HISTORY:  Past Medical History:  Diagnosis Date  . AAA (abdominal aortic aneurysm) ([facility])   . Anxiety   . Cancer (HCC)    laryngeal  . Carpal tunnel syndrome   . Cellulitis   . COPD (chronic obstructive pulmonary disease) ([place])   . Epigastric hernia   . GERD (gastroesophageal reflux disease)   . Hyperlipidemia   . Hypertension     used to take amlodipine, none now    SURGICAL HISTORY: Past Surgical History:  Procedure Laterality Date  . APPENDECTOMY    . DIRECT LARYNGOSCOPY N/A 05/16/2016   Procedure: MICRO DIRECT LARYNGOSCOPY WITH BIOPSY;  Surgeon: Leta Baptist, MD;  Location: [facility];  Service: ENT;  Laterality: N/A;  . ESOPHAGOGASTRODUODENOSCOPY N/A 06/27/2016   Procedure:  ESOPHAGOGASTRODUODENOSCOPY (EGD);  Surgeon: Aviva Signs, MD;  Location: [facility];  Service: General;  Laterality: N/A;  . HEMORROIDECTOMY    . PEG PLACEMENT N/A 06/27/2016   Procedure: PERCUTANEOUS ENDOSCOPIC GASTROSTOMY (PEG) PLACEMENT;  Surgeon: Aviva Signs, MD;  Location: [facility];  Service: General;  Laterality: N/A;  . PORTACATH PLACEMENT Left 06/27/2016   Procedure: INSERTION PORT-A-CATH;  Surgeon: Aviva Signs, MD;  Location: [facility];  Service: General;  Laterality: Left;  Marland Kitchen VASCULAR SURGERY Right 2008   stent to right common iliac artery    SOCIAL HISTORY: Social History   Social History  . Marital status: Widowed    Spouse name: N/A  . Number of children: N/A  . Years of education: N/A   Occupational History  . Not on file.   Social History Main Topics  . Smoking status: Current Every Day Smoker    Packs/day: 0.00    Years: 12.00    Types: Cigars  . Smokeless tobacco: Never Used     Comment: 5-7 cigars daily  . Alcohol use No  . Drug use: No  . Sexual activity: Not Currently   Other Topics Concern  . Not on file   Social History Narrative  . No narrative on file  Patient lives alone.  He has 2 daughters; one lives close to him and one lives in [place]. He has four grandchildren.  He smokes about  5 cigars day. He used to smoke cigarettes and chew tobacco when he was young. 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  FAMILY HISTORY: Family History  Problem Relation Age of Onset  . Heart failure Mother   . Coronary artery disease Father   . Stroke Father   . Alcoholism Father   . Heart disease Brother     MI  4 sisters; brother died from massive heart attack  Mother deceased at 28  Father deceased at 69 from massive heart attack.  ALLERGIES:  has No Known Allergies.  MEDICATIONS:  Current Outpatient Prescriptions  Medication Sig Dispense Refill  . albuterol (PROVENTIL HFA;VENTOLIN HFA) 108 (90 Base) MCG/ACT inhaler  Inhale 1 puff into the lungs every 6 (six) hours as needed for wheezing or shortness of breath.    Marland Kitchen atorvastatin (LIPITOR) 10 MG tablet Take 10 mg by mouth daily.  12  . azithromycin (ZITHROMAX) 250 MG tablet     . Choline Fenofibrate (TRILIPIX) 135 MG capsule Take 135 mg by mouth daily.     Marland Kitchen CISPLATIN IV Inject into the vein.    Marland Kitchen dexamethasone (DECADRON) 4 MG tablet Take 2 tablets by mouth once a day on the day after chemotherapy and then take 2 tablets two times a day for 2 days. Take with food. 30 tablet 1  . FLUCELVAX QUADRIVALENT SUSP     . fluconazole (DIFLUCAN) 100 MG tablet     . HYDROcodone-acetaminophen (NORCO) 10-325 MG tablet Take 1 tablet by mouth every 4 (four) hours as needed. (Patient taking differently: Take 1 tablet by mouth every 4 (four) hours as needed for moderate pain. ) 60 tablet 0  . lidocaine-prilocaine (EMLA) cream Apply 1 application topically as needed. 30 g 3  . LORazepam (ATIVAN) 1 MG tablet Take 1 mg by mouth Nightly.      . ondansetron (ZOFRAN) 8 MG tablet Take 1 tablet (8 mg total) by mouth every 8 (eight) hours as needed for nausea or vomiting. 30 tablet 2  . oxyCODONE-acetaminophen (PERCOCET/ROXICET) 5-325 MG tablet     . pantoprazole (PROTONIX) 40 MG tablet Take 40 mg by mouth 2 (two) times daily.  12  . prochlorperazine (COMPAZINE) 10 MG tablet Take 1 tablet (10 mg total) by mouth every 6 (six) hours as needed for nausea or vomiting. 30 tablet 2  . traMADol (ULTRAM) 50 MG tablet Take 1 tablet (50 mg total) by mouth every 6 (six) hours as needed for moderate pain. 30 tablet 0   Current Facility-Administered Medications  Medication Dose Route Frequency Provider Last Rate Last Dose  . feeding supplement (OSMOLITE 1.5 CAL) liquid 237 mL  237 mL Per Tube Q24H Patrici Ranks, MD        Review of Systems  Constitutional: Positive for malaise/fatigue.  HENT: Positive for sore throat.        Hoarseness   Eyes: Negative.   Respiratory: Negative.     Cardiovascular: Negative.   Gastrointestinal: Positive for constipation.       Constipation from pain medicine  Genitourinary: Negative.   Musculoskeletal: Negative.        Difficulty walking  Skin: Negative.   Neurological: Negative.   Endo/Heme/Allergies: Negative.   Psychiatric/Behavioral: The patient has insomnia.        Patient states he wakes up multiple times throughout the night   All other systems reviewed and are negative. 14 point ROS was done and is otherwise as detailed  above or in HPI   PHYSICAL EXAMINATION: ECOG PERFORMANCE STATUS: 1 - Symptomatic but completely ambulatory  Vitals:   07/12/16 0900  BP: 108/64  Pulse: 82  Resp: 16  Temp: 97.9 F (36.6 C)   Filed Weights   07/12/16 0900  Weight: 166 lb 12.8 oz (75.7 kg)     Physical Exam  Constitutional: He is oriented to person, place, and time and well-developed, well-nourished, and in no distress.  HENT:  Head: Normocephalic and atraumatic.  Nose: Nose normal.  Mouth/Throat: Oropharynx is clear and moist. No oropharyngeal exudate.  Eyes: Conjunctivae and EOM are normal. Pupils are equal, round, and reactive to light. Right eye exhibits no discharge. Left eye exhibits no discharge. No scleral icterus.  Neck: Normal range of motion. Neck supple. No tracheal deviation present. No thyromegaly present.  Bilateral palpable neck adenopathy    Cardiovascular: Normal rate, regular rhythm and normal heart sounds.  Exam reveals no gallop and no friction rub.   No murmur heard. Pulmonary/Chest: Effort normal and breath sounds normal. He has no wheezes. He has no rales.  Abdominal: Soft. Bowel sounds are normal. He exhibits no distension and no mass. There is no tenderness. There is no rebound and no guarding.  Musculoskeletal: Normal range of motion. He exhibits no edema.  Lymphadenopathy:    He has cervical adenopathy.    He has no axillary adenopathy.  Neurological: He is alert and oriented to person, place,  and time. He has normal reflexes. No cranial nerve deficit. Gait normal. Coordination normal.  Skin: Skin is warm and dry. No rash noted.  Psychiatric: Mood, memory, affect and judgment normal.  Nursing note and vitals reviewed.  LABORATORY DATA:  I have reviewed the data as listed Results for Bruce, [name] (MRN [id number]) as of 07/12/2016 09:46  Ref. Range 07/01/2016 09:15  Sodium Latest Ref Range: 135 - 145 mmol/L 136  Potassium Latest Ref Range: 3.5 - 5.1 mmol/L 3.4 (L)  Chloride Latest Ref Range: 101 - 111 mmol/L 104  CO2 Latest Ref Range: 22 - 32 mmol/L 24  BUN Latest Ref Range: 6 - 20 mg/dL 19  Creatinine Latest Ref Range: 0.61 - 1.24 mg/dL 0.99  Calcium Latest Ref Range: 8.9 - 10.3 mg/dL 10.1  EGFR (Non-African Amer.) Latest Ref Range: >60 mL/min >60  EGFR (African American) Latest Ref Range: >60 mL/min >60  Glucose Latest Ref Range: 65 - 99 mg/dL 132 (H)  Anion gap Latest Ref Range: 5 - 15  8  Magnesium Latest Ref Range: 1.7 - 2.4 mg/dL 1.6 (L)  Alkaline Phosphatase Latest Ref Range: 38 - 126 U/L 46  Albumin Latest Ref Range: 3.5 - 5.0 g/dL 3.7  AST Latest Ref Range: 15 - 41 U/L 19  ALT Latest Ref Range: 17 - 63 U/L 10 (L)  Total Protein Latest Ref Range: 6.5 - 8.1 g/dL 7.6  Total Bilirubin Latest Ref Range: 0.3 - 1.2 mg/dL 0.6  WBC Latest Ref Range: 4.0 - 10.5 K/uL 15.1 (H)  RBC Latest Ref Range: 4.22 - 5.81 MIL/uL 5.06  Hemoglobin Latest Ref Range: 13.0 - 17.0 g/dL 15.6  HCT Latest Ref Range: 39.0 - 52.0 % 45.0  MCV Latest Ref Range: 78.0 - 100.0 fL 88.9  MCH Latest Ref Range: 26.0 - 34.0 pg 30.8  MCHC Latest Ref Range: 30.0 - 36.0 g/dL 34.7  RDW Latest Ref Range: 11.5 - 15.5 % 15.0  Platelets Latest Ref Range: 150 - 400 K/uL 329  Neutrophils Latest Units: % 51  Lymphocytes Latest Units: % 37  Monocytes Relative Latest Units: % 7  Eosinophil Latest Units: % 4  Basophil Latest Units: % 1  NEUT# Latest Ref Range: 1.7 - 7.7 K/uL 7.8 (H)  Lymphocyte # Latest Ref  Range: 0.7 - 4.0 K/uL 5.6 (H)  Monocyte # Latest Ref Range: 0.1 - 1.0 K/uL 1.1 (H)  Eosinophils Absolute Latest Ref Range: 0.0 - 0.7 K/uL 0.6  Basophils Absolute Latest Ref Range: 0.0 - 0.1 K/uL 0.1     RADIOGRAPHIC STUDIES: I have personally reviewed the radiological images as listed and agreed with the findings in the report. No results found.  Study Result  CLINICAL DATA:  Initial treatment strategy for left aryepiglottic fold mass.  EXAM: NUCLEAR MEDICINE PET SKULL BASE TO THIGH  TECHNIQUE: 8.4 mCi F-18 FDG was injected intravenously. Full-ring PET imaging was performed from the skull base to thigh after the radiotracer. CT data was obtained and used for attenuation correction and anatomic localization.  FASTING BLOOD GLUCOSE:  Value: 107 mg/dl  COMPARISON:  Chest abdomen and pelvic CT of 03/02/2016. No prior PET.  FINDINGS: NECK  Left-sided mass centered at the aryepiglottic fold and piriform sinus measures a S.U.V. max of 14.1, including on image 38/series 4.  Bilateral cervical nodal metastasis. A right-sided level IIa node measures 2.0 cm and a S.U.V. max of 13.3 on image 34/series 4.  Left level 5 node measures 1.5 cm and a S.U.V. max of 8.9 on image 26/series 4.  Right-sided supraclavicular/ level 4 node measures 1.0 cm and a S.U.V. max of 11.0 on image 41/ series 4.  CHEST  Bilateral upper lobe geographic pulmonary hypermetabolism corresponds to dependent pulmonary opacities which are progressive since 03/02/2016. No thoracic nodal hypermetabolism.  ABDOMEN/PELVIS  No abdominal pelvic nodal hypermetabolism. Note is made of left-sided prostatic hypermetabolism which measures a S.U.V. max of 6.9.  SKELETON  Right-sided latissimus muscular activity is favored to be due to motion after radiopharmaceutical injection. This measures a S.U.V. max of 2.9, including on approximately image 51/series 4.  CT IMAGES PERFORMED FOR ATTENUATION  CORRECTION  Bilateral carotid atherosclerosis. Mild cardiomegaly. Multivessel coronary artery atherosclerosis. Moderate centrilobular and paraseptal emphysema. Right upper lobe 4 mm pulmonary nodule on image 71/ series 4 is unchanged.  A perifissural right upper lobe 9 mm pulmonary nodule on image 82/series 4. This is present back on 06/19/2009 per the 03/02/2016 report.  Right middle lobe 4 mm nodule on image 88/ series 4 is unchanged since the most recent exam. Aortic stent graft repair. Mild prostatomegaly. 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  IMPRESSION: 1. Left-sided primary centered at the piriform sinus and aryepiglottic fold. 2. Bilateral cervical nodal metastasis. 3. No extracervical metastatic disease identified. 4. Bilateral pulmonary hypermetabolism is suspicious for developing interstitial lung disease (likely nonspecific interstitial pneumonia) superimposed upon centrilobular and paraseptal emphysema. Infectious process is felt less likely. 5. Hypermetabolic focus in the left side of the prostate is nonspecific. Consider correlation with PSA level and physical exam. 6. Right latissimus muscular hypermetabolism is likely incidental but warrants followup attention.   Electronically Signed   By: Abigail Miyamoto M.D.   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 He notes significant draining around his FT insertion site. I have referred him to IR for evaluation.  Magic mouthwash has been called in. I have also called in liquid lortab for pain.   He was provided a constipation sheet. We discussed prevention and treatment of constipation in detail.   I addressed the importance of smoking cessation with  the patient in detail.  We discussed the health benefits of cessation.  We discussed the health detriments of ongoing tobacco use including but not limited to COPD, heart disease and malignancy. We reviewed the multiple options for cessation and I offered to refer him to smoking cessation classes. We discussed other alternatives to quit such as chantix, wellbutrin. We will continue to address this moving forward.  WE discussed the importance of seeing [place] weekly, making sure he lets [place] know of any difficulties he has during therapy. He has met with Anderson Malta our navigator and understands she is available to him to help through therapy.   We again reviewed the importance of communicating difficulties to [place] through therapy.   All questions were answered. The patient knows to call the clinic with any problems, questions or concerns.  ORDERS PLACED FOR THIS ENCOUNTER: Orders Placed This Encounter  Procedures  . IR Radiologist Eval & Mgmt   This document serves as a record of services personally performed by Ancil Linsey, MD. It was created on her behalf by Elmyra Ricks, a trained medical scribe. The creation of this record is based on the scribe's personal observations and the provider's statements to them. This document has been checked and approved by the attending provider.  I have reviewed the above documentation for accuracy and completeness, and I agree with the above.  This note was electronically signed.    Molli Hazard, MD  07/12/2016 9:48 AM

## 2016-07-12 NOTE — Patient Instructions (Addendum)
[facility] at [facility] Discharge Instructions  RECOMMENDATIONS MADE BY THE CONSULTANT AND ANY TEST RESULTS WILL BE SENT TO YOUR REFERRING PHYSICIAN.  Study feeding tube leaking at Interventional Radiology in [facility] prescribed  Liquid Lortab prescribed for pain  Constipation sheet given  Continue chemo & IV Fluids  Return to clinic @ the end of next week    Thank you for choosing [facility] at [facility] to provide your oncology and hematology care.  To afford each patient quality time with our provider, please arrive at least 15 minutes before your scheduled appointment time.   Beginning January 23rd 2017 lab work for the [facility] will be done in the  Main lab at [facility] on 1st floor. If you have a lab appointment with the [facility] please come in thru the  Main Entrance and check in at the main information desk  You need to re-schedule your appointment should you arrive 10 or more minutes late.  We strive to give you quality time with our providers, and arriving late affects you and other patients whose appointments are after yours.  Also, if you no show three or more times for appointments you may be dismissed from the clinic at the providers discretion.     Again, thank you for choosing [facility]  Our hope is that these requests will decrease the amount of time that you wait before being seen by our physicians.       _____________________________________________________________  Should you have questions after your visit to [facility], please contact our office at [phone] between the hours of 8:30 a.m. and 4:30 p.m.  Voicemails left after 4:30 p.m. will not be returned until the following business day.  For prescription refill requests, have your pharmacy contact our office.         Resources For Cancer Patients and their Caregivers ? American Cancer  Society: Can assist with transportation, wigs, general needs, runs Look Good Feel Better.        (579)355-3269 ? Cancer Care: Provides financial assistance, online support groups, medication/co-pay assistance.  1-800-813-HOPE 620-081-6350) ? [facility] Assists [place] cancer patients and their families through emotional , educational and financial support.  872-162-1290 ? [place] DSS Where to apply for food stamps, Medicaid and utility assistance. 225-189-6591 ? [facility]: Transportation to medical appointments. 308-591-6174 ? Social Security Administration: May apply for disability if have a Stage IV cancer. (361)038-2135 435-394-7518 ? [employer], Disability and Transit Services: Assists with nutrition, care and transit needs. [place] Support Programs: @10RELATIVEDAYS @ > Cancer Support Group  2nd Tuesday of the month 1pm-2pm, Journey Room  > Creative Journey  3rd Tuesday of the month 1130am-1pm, Journey Room  > Look Good Feel Better  1st Wednesday of the month 10am-12 noon, Journey Room (Call [place] to register [phone])

## 2016-07-13 ENCOUNTER — Encounter (HOSPITAL_COMMUNITY)
Admission: RE | Admit: 2016-07-13 | Discharge: 2016-07-13 | Disposition: A | Discharge status: Home / Self Care | Payer: Medicare Other | Source: Ambulatory Visit | Attending: Hematology & Oncology | Admitting: Hematology & Oncology

## 2016-07-13 DIAGNOSIS — C131 Malignant neoplasm of aryepiglottic fold, hypopharyngeal aspect: Secondary | ICD-10-CM | POA: Diagnosis not present

## 2016-07-13 MED ORDER — HEPARIN SOD (PORK) LOCK FLUSH 100 UNIT/ML IV SOLN
INTRAVENOUS | Status: AC
  Filled 2016-07-13: qty 5

## 2016-07-13 MED ORDER — HEPARIN SOD (PORK) LOCK FLUSH 100 UNIT/ML IV SOLN
500.0000 [IU] | Freq: Once | INTRAVENOUS | Status: AC
  Administered 2016-07-13: 500 [IU]

## 2016-07-13 MED ORDER — SODIUM CHLORIDE 0.9 % IV SOLN
INTRAVENOUS | Status: DC
  Administered 2016-07-13: 1000 mL via INTRAVENOUS

## 2016-07-14 ENCOUNTER — Ambulatory Visit (HOSPITAL_COMMUNITY)
Admission: RE | Admit: 2016-07-14 | Discharge: 2016-07-14 | Disposition: A | Discharge status: Home / Self Care | Payer: Medicare Other | Source: Ambulatory Visit | Attending: Hematology & Oncology | Admitting: Hematology & Oncology

## 2016-07-14 ENCOUNTER — Other Ambulatory Visit (HOSPITAL_COMMUNITY): Payer: Self-pay | Admitting: Hematology & Oncology

## 2016-07-14 ENCOUNTER — Encounter (HOSPITAL_COMMUNITY): Payer: Self-pay | Admitting: *Deleted

## 2016-07-14 DIAGNOSIS — C321 Malignant neoplasm of supraglottis: Secondary | ICD-10-CM

## 2016-07-14 HISTORY — PX: IR GENERIC HISTORICAL: IMG1180011

## 2016-07-14 NOTE — Procedures (Signed)
Patient came in with complaint of leaking around g tube insertion site.  The site was evaluated and appeared clean and dry with no skin breakdown or redness.  The bumper was adjusted slightly to see if it would lessen the drainage patient states he has at home.  Patient was advised that he could put vasoline on the skin under the gauze and bumper for a skin barrier to help prevent skin breakdown if he notices it becoming red.  Patient did not have any other concerns and just wanted to make sure the tube was ok.  I advised him to call us if he has any further problems

## 2016-07-15 ENCOUNTER — Encounter (HOSPITAL_COMMUNITY): Payer: Self-pay

## 2016-07-15 ENCOUNTER — Encounter (HOSPITAL_COMMUNITY)
Admission: RE | Admit: 2016-07-15 | Discharge: 2016-07-15 | Disposition: A | Discharge status: Home / Self Care | Payer: Medicare Other | Source: Ambulatory Visit | Attending: Hematology & Oncology | Admitting: Hematology & Oncology

## 2016-07-15 DIAGNOSIS — C131 Malignant neoplasm of aryepiglottic fold, hypopharyngeal aspect: Secondary | ICD-10-CM | POA: Diagnosis not present

## 2016-07-15 MED ORDER — HEPARIN SOD (PORK) LOCK FLUSH 100 UNIT/ML IV SOLN: 500.0000 [IU] | Freq: Once | INTRAVENOUS | Status: DC

## 2016-07-15 MED ORDER — SODIUM CHLORIDE 0.9 % IV SOLN
INTRAVENOUS | Status: DC
  Administered 2016-07-15: 12:00:00 via INTRAVENOUS

## 2016-07-18 ENCOUNTER — Encounter (HOSPITAL_COMMUNITY)
Admission: RE | Admit: 2016-07-18 | Discharge: 2016-07-18 | Disposition: A | Discharge status: Home / Self Care | Payer: Medicare Other | Source: Ambulatory Visit | Attending: Hematology & Oncology | Admitting: Hematology & Oncology

## 2016-07-18 ENCOUNTER — Encounter (HOSPITAL_COMMUNITY): Payer: Self-pay

## 2016-07-18 DIAGNOSIS — C131 Malignant neoplasm of aryepiglottic fold, hypopharyngeal aspect: Secondary | ICD-10-CM | POA: Diagnosis not present

## 2016-07-18 MED ORDER — SODIUM CHLORIDE 0.9 % IV SOLN
INTRAVENOUS | Status: DC
  Administered 2016-07-18: 10:00:00 via INTRAVENOUS

## 2016-07-18 MED ORDER — HEPARIN SOD (PORK) LOCK FLUSH 100 UNIT/ML IV SOLN
500.0000 [IU] | Freq: Once | INTRAVENOUS | Status: AC
  Administered 2016-07-18: 500 [IU]
  Filled 2016-07-18: qty 5

## 2016-07-20 ENCOUNTER — Encounter: Payer: Self-pay | Admitting: Dietician

## 2016-07-20 ENCOUNTER — Encounter (HOSPITAL_COMMUNITY)
Admission: RE | Admit: 2016-07-20 | Discharge: 2016-07-20 | Disposition: A | Discharge status: Home / Self Care | Payer: Medicare Other | Source: Ambulatory Visit | Attending: Hematology & Oncology | Admitting: Hematology & Oncology

## 2016-07-20 DIAGNOSIS — C131 Malignant neoplasm of aryepiglottic fold, hypopharyngeal aspect: Secondary | ICD-10-CM | POA: Diagnosis not present

## 2016-07-20 MED ORDER — SODIUM CHLORIDE 0.9 % IV SOLN: INTRAVENOUS | Status: DC

## 2016-07-20 MED ORDER — HEPARIN SOD (PORK) LOCK FLUSH 100 UNIT/ML IV SOLN: 500.0000 [IU] | INTRAVENOUS | Status: DC | PRN

## 2016-07-20 MED ORDER — HEPARIN SOD (PORK) LOCK FLUSH 100 UNIT/ML IV SOLN
500.0000 [IU] | INTRAVENOUS | Status: DC | PRN
  Filled 2016-07-20: qty 5

## 2016-07-20 MED ORDER — SODIUM CHLORIDE 0.9 % IV SOLN
INTRAVENOUS | Status: DC
  Administered 2016-07-20: 1000 mL via INTRAVENOUS

## 2016-07-20 NOTE — Progress Notes (Signed)
Follow up with H&N patient who is currently undergoing chemoradiation   Contacted Pt by visiting during scheduled IVF infusion   Wt Readings from Last 10 Encounters:  07/12/16 166 lb 12.8 oz (75.7 kg)  07/01/16 167 lb 12.8 oz (76.1 kg)  06/23/16 168 lb (76.2 kg)  06/15/16 169 lb 4.8 oz (76.8 kg)  06/09/16 168 lb 6.4 oz (76.4 kg)  05/16/16 170 lb (77.1 kg)  02/24/16 173 lb 6.4 oz (78.7 kg)  06/21/11 165 lb (74.8 kg)   Patient has not been weighed in the last week. His last weight marked a 1 lb loss since his first chemo infusion.   Patient reports oral intake as fair. He does report odynophagia, "my throat stays painful", but denies any s/s of dysphagia. He does not feel like he is choking when he eats nor does he endorse coughing after swallowing or drinking. Therefore, he is encouraged to continue eating.   He does not want to take pain medications to manage his throat pain due to side effect of constipation, which he already has. RD educated that one of leading causes of constipation is dehydration. He states his urine is dark in the morning, but lightens as the day progresses. Encouraged him to start placing additional syringes of water through PEG (currently only doing 2x)  Tube feeding wise, patient has only utilized his tube a few times. He says he has used 5-6 cans of TF thus far. RD encouraged him to start utilizing his tube more as his PO intake will very likely worsen as he continues with treatment.   Patient denies any questions/concerns at this time. Restated that he can call RD at any time with TF/nutritional questions or concerns. RD to continue to monitor.   [name] RD, LDN, [place] [facility] Pager: [phone] 07/20/2016 11:16 AM

## 2016-07-22 ENCOUNTER — Encounter (HOSPITAL_COMMUNITY): Payer: Medicare Other

## 2016-07-22 ENCOUNTER — Encounter (HOSPITAL_BASED_OUTPATIENT_CLINIC_OR_DEPARTMENT_OTHER): Payer: Medicare Other

## 2016-07-22 ENCOUNTER — Encounter (HOSPITAL_BASED_OUTPATIENT_CLINIC_OR_DEPARTMENT_OTHER): Payer: Medicare Other | Admitting: Oncology

## 2016-07-22 ENCOUNTER — Encounter (HOSPITAL_COMMUNITY): Payer: Self-pay | Admitting: Oncology

## 2016-07-22 VITALS — BP 128/62 | HR 73 | Temp 98.0°F | Resp 16 | Wt 163.4 lb

## 2016-07-22 DIAGNOSIS — C321 Malignant neoplasm of supraglottis: Secondary | ICD-10-CM

## 2016-07-22 DIAGNOSIS — Z5111 Encounter for antineoplastic chemotherapy: Secondary | ICD-10-CM

## 2016-07-22 LAB — CBC WITH DIFFERENTIAL/PLATELET
BASOS ABS: 0.1 10*3/uL (ref 0.0–0.1)
Basophils Relative: 2 %
EOS ABS: 0.1 10*3/uL (ref 0.0–0.7)
Eosinophils Relative: 3 %
HCT: 37.1 % — ABNORMAL LOW (ref 39.0–52.0)
Hemoglobin: 12.5 g/dL — ABNORMAL LOW (ref 13.0–17.0)
LYMPHS ABS: 1.2 10*3/uL (ref 0.7–4.0)
Lymphocytes Relative: 26 %
MCH: 29.9 pg (ref 26.0–34.0)
MCHC: 33.7 g/dL (ref 30.0–36.0)
MCV: 88.8 fL (ref 78.0–100.0)
MONO ABS: 1 10*3/uL (ref 0.1–1.0)
Monocytes Relative: 22 %
NEUTROS ABS: 2.3 10*3/uL (ref 1.7–7.7)
Neutrophils Relative %: 47 %
Platelets: 342 10*3/uL (ref 150–400)
RBC: 4.18 MIL/uL — ABNORMAL LOW (ref 4.22–5.81)
RDW: 15.2 % (ref 11.5–15.5)
WBC Morphology: INCREASED
WBC: 4.7 10*3/uL (ref 4.0–10.5)

## 2016-07-22 LAB — COMPREHENSIVE METABOLIC PANEL
ALBUMIN: 3 g/dL — AB (ref 3.5–5.0)
ALT: 18 U/L (ref 17–63)
ANION GAP: 5 (ref 5–15)
AST: 21 U/L (ref 15–41)
Alkaline Phosphatase: 60 U/L (ref 38–126)
BUN: 8 mg/dL (ref 6–20)
CO2: 27 mmol/L (ref 22–32)
Calcium: 8.9 mg/dL (ref 8.9–10.3)
Chloride: 103 mmol/L (ref 101–111)
Creatinine, Ser: 0.92 mg/dL (ref 0.61–1.24)
GFR calc Af Amer: >60 mL/min (ref >60)
GFR calc non Af Amer: >60 mL/min (ref >60)
GLUCOSE: 127 mg/dL — AB (ref 65–99)
POTASSIUM: 3.5 mmol/L (ref 3.5–5.1)
SODIUM: 135 mmol/L (ref 135–145)
TOTAL PROTEIN: 6.9 g/dL (ref 6.5–8.1)
Total Bilirubin: 0.2 mg/dL — ABNORMAL LOW (ref 0.3–1.2)

## 2016-07-22 LAB — MAGNESIUM: Magnesium: 1.6 mg/dL — ABNORMAL LOW (ref 1.7–2.4)

## 2016-07-22 MED ORDER — PALONOSETRON HCL INJECTION 0.25 MG/5ML
0.2500 mg | Freq: Once | INTRAVENOUS | Status: AC
  Administered 2016-07-22: 0.25 mg via INTRAVENOUS

## 2016-07-22 MED ORDER — MAGNESIUM SULFATE 2 GM/50ML IV SOLN
2.0000 g | Freq: Once | INTRAVENOUS | Status: AC
  Administered 2016-07-22: 2 g via INTRAVENOUS
  Filled 2016-07-22: qty 50

## 2016-07-22 MED ORDER — PALONOSETRON HCL INJECTION 0.25 MG/5ML
INTRAVENOUS | Status: AC
  Filled 2016-07-22: qty 5

## 2016-07-22 MED ORDER — MORPHINE SULFATE (PF) 2 MG/ML IV SOLN
4.0000 mg | Freq: Once | INTRAVENOUS | Status: AC
  Administered 2016-07-22: 4 mg via INTRAVENOUS

## 2016-07-22 MED ORDER — MORPHINE SULFATE 4 MG/ML IJ SOLN: 4.0000 mg | Freq: Once | INTRAMUSCULAR | Status: DC

## 2016-07-22 MED ORDER — SODIUM CHLORIDE 0.9 % IV SOLN
INTRAVENOUS | Status: DC
  Administered 2016-07-22: 12:00:00 via INTRAVENOUS

## 2016-07-22 MED ORDER — SODIUM CHLORIDE 0.9 % IV SOLN: 2.0000 g | Freq: Once | INTRAVENOUS | Status: DC

## 2016-07-22 MED ORDER — SODIUM CHLORIDE 0.9 % IV SOLN
100.0000 mg/m2 | Freq: Once | INTRAVENOUS | Status: AC
  Administered 2016-07-22: 191 mg via INTRAVENOUS
  Filled 2016-07-22: qty 191

## 2016-07-22 MED ORDER — SODIUM CHLORIDE 0.9 % IV SOLN
Freq: Once | INTRAVENOUS | Status: AC
  Administered 2016-07-22: 12:00:00 via INTRAVENOUS
  Filled 2016-07-22: qty 5

## 2016-07-22 MED ORDER — HEPARIN SOD (PORK) LOCK FLUSH 100 UNIT/ML IV SOLN
500.0000 [IU] | Freq: Once | INTRAVENOUS | Status: AC | PRN
  Administered 2016-07-22: 500 [IU]

## 2016-07-22 MED ORDER — HEPARIN SOD (PORK) LOCK FLUSH 100 UNIT/ML IV SOLN
INTRAVENOUS | Status: AC
  Filled 2016-07-22: qty 5

## 2016-07-22 MED ORDER — SODIUM CHLORIDE 0.9% FLUSH: 10.0000 mL | INTRAVENOUS | Status: DC | PRN

## 2016-07-22 MED ORDER — MORPHINE SULFATE (PF) 2 MG/ML IV SOLN
INTRAVENOUS | Status: AC
  Filled 2016-07-22: qty 2

## 2016-07-22 MED ORDER — POTASSIUM CHLORIDE 2 MEQ/ML IV SOLN
Freq: Once | INTRAVENOUS | Status: AC
  Administered 2016-07-22: 10:00:00 via INTRAVENOUS
  Filled 2016-07-22: qty 10

## 2016-07-22 NOTE — Progress Notes (Signed)
Bruce Chroman, MD [place] [address]  Cancer of aryepiglottic fold or interarytenoid fold, laryngeal aspect ([place])  CURRENT THERAPY: Cisplatin/XRT with curative intent  INTERVAL HISTORY: Bruce [name] 70 y.o. 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    Cancer of aryepiglottic fold or interarytenoid fold, laryngeal aspect ([place])   05/16/2016 Procedure    Microdirect laryngoscopy and biopsy by Dr. Benjamine Mola      05/18/2016 Pathology Results    Diagnosis 1. Larynx, biopsy, Right aryepiglottic - INVASIVE POORLY DIFFERENTIATED CARCINOMA. - SEE COMMENT. 2. Larynx, biopsy, Left Aryepiglottic Fold - INVASIVE POORLY      06/14/2016 PET scan    1. Left-sided primary centered at the piriform sinus and aryepiglottic fold. 2. Bilateral cervical nodal metastasis. 3. No extracervical metastatic disease identified. 4. Bilateral pulmonary hypermetabolism is suspicious for developing interstitial lung disease (likely nonspecific interstitial pneumonia) superimposed upon centrilobular and paraseptal emphysema. Infectious process is felt less likely. 5. Hypermetabolic focus in the left side of the prostate is nonspecific. Consider correlation with PSA level and physical exam. 6. Right latissimus muscular hypermetabolism is likely incidental but warrants followup attention.      06/27/2016 Procedure     Port-A-Cath insertion, EGD with PEG placement by Dr. Arnoldo Morale      07/01/2016 -  Chemotherapy    The patient had palonosetron (ALOXI) injection 0.25 mg, 0.25 mg, Intravenous,  Once, 1 of 3 cycles  CISplatin (PLATINOL) 191 mg in sodium chloride 0.9 % 500 mL chemo infusion, 100 mg/m2 = 191 mg, Intravenous,  Once, 1 of 3 cycles  fosaprepitant (EMEND) 150 mg, dexamethasone (DECADRON) 12 mg in sodium chloride 0.9 % 145 mL IVPB, , Intravenous,  Once, 1 of 3 cycles  for chemotherapy treatment.        07/01/2016 -  Radiation Therapy          He reports constipation with his pain regimen.  He is given a constipation sheet and given information for a bowel regimen.  He notes that the liquid pain medication is helpful.  He otherwise denies any complaints.  He is very stoic.   Review of Systems  Constitutional: Positive for weight loss. Negative for chills and fever.  HENT: Positive for sore throat.   Eyes: Negative.   Respiratory: Negative.  Negative for cough.   Cardiovascular: Negative.  Negative for chest pain.  Gastrointestinal: Positive for constipation. Negative for diarrhea.  Genitourinary: Negative.   Musculoskeletal: Negative.   Skin: Negative.   Neurological: Negative.  Negative for weakness.  Endo/Heme/Allergies: Negative.   Psychiatric/Behavioral: Negative.     Past Medical History:  Diagnosis Date  . AAA (abdominal aortic aneurysm) ([place])   . Anxiety   . Cancer (HCC)    laryngeal  . Cancer of aryepiglottic fold or interarytenoid fold, laryngeal aspect ([place]) 06/15/2016  . Carpal tunnel syndrome   . Cellulitis   . COPD (chronic obstructive pulmonary disease) ([place])   . Epigastric hernia   . GERD (gastroesophageal reflux disease)   . Hyperlipidemia   . Hypertension     used to take amlodipine, none now    Past Surgical History:  Procedure Laterality Date  . APPENDECTOMY    . DIRECT LARYNGOSCOPY N/A 05/16/2016   Procedure: MICRO DIRECT LARYNGOSCOPY WITH BIOPSY;  Surgeon: Leta Baptist, MD;  Location: [facility];  Service: ENT;  Laterality: N/A;  . ESOPHAGOGASTRODUODENOSCOPY N/A 06/27/2016  Procedure: ESOPHAGOGASTRODUODENOSCOPY (EGD);  Surgeon: Aviva Signs, MD;  Location: [facility];  Service: General;  Laterality: N/A;  . HEMORROIDECTOMY    . IR GENERIC HISTORICAL  07/14/2016   IR RADIOLOGIST EVAL & MGMT [facility]  . IR GENERIC HISTORICAL  07/14/2016   IR PATIENT EVAL TECH 0-60 MINS [facility]  . PEG PLACEMENT N/A 06/27/2016   Procedure:  PERCUTANEOUS ENDOSCOPIC GASTROSTOMY (PEG) PLACEMENT;  Surgeon: Aviva Signs, MD;  Location: [facility];  Service: General;  Laterality: N/A;  . PORTACATH PLACEMENT Left 06/27/2016   Procedure: INSERTION PORT-A-CATH;  Surgeon: Aviva Signs, MD;  Location: [facility];  Service: General;  Laterality: Left;  [facility] Kitchen VASCULAR SURGERY Right 2008   stent to right common iliac artery    Family History  Problem Relation Age of Onset  . Heart failure Mother   . Coronary artery disease Father   . Stroke Father   . Alcoholism Father   . Heart disease Brother     MI    Social History   Social History  . Marital status: Widowed    Spouse name: N/A  . Number of children: N/A  . Years of education: N/A   Social History Main Topics  . Smoking status: Current Every Day Smoker    Packs/day: 0.00    Years: 12.00    Types: Cigars  . Smokeless tobacco: Never Used     Comment: 5-7 cigars daily  . Alcohol use No  . Drug use: No  . Sexual activity: Not Currently   Other Topics Concern  . None   Social History Narrative  . None     PHYSICAL EXAMINATION  ECOG PERFORMANCE STATUS: 1 - Symptomatic but completely ambulatory  There were no vitals filed for this visit.  Vitals - 1 value per visit [id number]  SYSTOLIC 0000000  DIASTOLIC 62  Pulse 73  Temperature 98  Respirations 16  Weight (lb) 163.4   GENERAL:alert, no distress, comfortable, cooperative and accompanied by daughter, stoic. SKIN: skin color, texture, turgor are normal, no rashes or significant lesions HEAD: Normocephalic, No masses, lesions, tenderness or abnormalities EYES: normal, EOMI, Conjunctiva are pink and non-injected EARS: External ears normal OROPHARYNX:lips, buccal mucosa, and tongue normal and mucous membranes are moist  NECK: supple, trachea midline LYMPH:  not examined BREAST:not examined LUNGS: clear to auscultation  HEART: regular rate & rhythm ABDOMEN:abdomen soft BACK: Back symmetric, no curvature. EXTREMITIES:less  then 2 second capillary refill, no joint deformities, effusion, or inflammation, no skin discoloration, no cyanosis  NEURO: alert & oriented x 3 with fluent speech, no focal motor/sensory deficits, gait normal   LABORATORY DATA: CBC    Component Value Date/Time   WBC 4.7 07/22/2016 0940   RBC 4.18 (L) 07/22/2016 0940   HGB 12.5 (L) 07/22/2016 0940   HCT 37.1 (L) 07/22/2016 0940   PLT 342 07/22/2016 0940   MCV 88.8 07/22/2016 0940   MCH 29.9 07/22/2016 0940   MCHC 33.7 07/22/2016 0940   RDW 15.2 07/22/2016 0940   LYMPHSABS 1.2 07/22/2016 0940   MONOABS 1.0 07/22/2016 0940   EOSABS 0.1 07/22/2016 0940   BASOSABS 0.1 07/22/2016 0940      Chemistry      Component Value Date/Time   NA 135 07/22/2016 0940   K 3.5 07/22/2016 0940   CL 103 07/22/2016 0940   CO2 27 07/22/2016 0940   BUN 8 07/22/2016 0940   CREATININE 0.92 07/22/2016 0940   CREATININE 0.83 06/16/2011 1155      Component  Value Date/Time   CALCIUM 8.9 07/22/2016 0940   ALKPHOS 60 07/22/2016 0940   AST 21 07/22/2016 0940   ALT 18 07/22/2016 0940   BILITOT 0.2 (L) 07/22/2016 0940        PENDING LABS:   RADIOGRAPHIC STUDIES:  Dg Chest Port 1 View  Result Date: 06/27/2016 CLINICAL DATA:  Status post Port-a-Cath Placement, left upper chest. EXAM: PORTABLE CHEST - 1 VIEW COMPARISON:  PET-CT 06/14/2016 and previous FINDINGS: Left subclavian port catheter, tip mid SVC. No pneumothorax. Moderate bilateral interstitial infiltrates or edema. Heart size upper limits normal.  Tortuous atheromatous aorta. No effusion. Visualized bones unremarkable. IMPRESSION: 1. Left subclavian port to mid SVC.  No pneumothorax. 2. Bilateral interstitial edema or infiltrates. 3.  Aortic Atherosclerosis (ICD10-170.0) Electronically Signed   By: Lucrezia Europe M.D.   On: 06/27/2016 09:59   Dg C-arm 1-60 Min-no Report  Result Date: 06/27/2016 CLINICAL DATA: larangal cancer C-ARM 1-60 MINUTES Fluoroscopy was utilized by the requesting  physician.  No radiographic interpretation.   Ir Radiologist Eval & Mgmt  Result Date: 07/14/2016 Chipper Oman     07/14/2016 10:47 AM Patient came in with complaint of leaking around g tube insertion site.  The site was evaluated and appeared clean and dry with no skin breakdown or redness.  The bumper was adjusted slightly to see if it would lessen the drainage patient states he has at home.  Patient was advised that he could put vasoline on the skin under the gauze and bumper for a skin barrier to help prevent skin breakdown if he notices it becoming red.  Patient did not have any other concerns and just wanted to make sure the tube was ok.  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  Undergoing concomitant chemoXRT consisting of Cisplatin every 3 weeks beginning on 07/01/2016.  Pre-treatment labs: CBC diff, CMET, magnesium.  I personally reviewed and went over laboratory results with the patient.  The results are noted within this dictation.  He is educated to call us with any issues or complications.  He will continue with IVF M-W-F during treatment.  Return in ~ 10 days for ongoing follow-up (11/8).    ORDERS PLACED FOR THIS ENCOUNTER: No orders of the defined types were placed in this encounter.   MEDICATIONS PRESCRIBED THIS ENCOUNTER: No orders of the defined types were placed in this encounter.   THERAPY PLAN:  Continue treatment as outlined above with curative intent.  All questions were answered. The patient knows to call the clinic with any problems, questions or concerns. We can certainly see the patient much sooner if necessary.  Patient and plan discussed with Dr. Ancil Linsey and she is in agreement with the aforementioned.   This note is  electronically signed by: Doy Mince 07/22/2016 5:58 PM

## 2016-07-22 NOTE — Patient Instructions (Signed)
[facility] Discharge Instructions for Patients Receiving Chemotherapy   Beginning January 23rd 2017 lab work for the Cancer Center will be done in the  Main lab at [facility] on 1st floor. If you have a lab appointment with the Cancer Center please come in thru the  Main Entrance and check in at the main information desk   Today you received the following chemotherapy agent: Cisplatin.     If you develop nausea and vomiting, or diarrhea that is not controlled by your medication, call the clinic.  The clinic phone number is [phone]. Office hours are Monday-Friday 8:30am-5:00pm.  BELOW ARE SYMPTOMS THAT SHOULD BE REPORTED IMMEDIATELY:  *FEVER GREATER THAN 101.0 F  *CHILLS WITH OR WITHOUT FEVER  NAUSEA AND VOMITING THAT IS NOT CONTROLLED WITH YOUR NAUSEA MEDICATION  *UNUSUAL SHORTNESS OF BREATH  *UNUSUAL BRUISING OR BLEEDING  TENDERNESS IN MOUTH AND THROAT WITH OR WITHOUT PRESENCE OF ULCERS  *URINARY PROBLEMS  *BOWEL PROBLEMS  UNUSUAL RASH Items with * indicate a potential emergency and should be followed up as soon as possible. If you have an emergency after office hours please contact your primary care physician or go to the nearest emergency department.  Please call the clinic during office hours if you have any questions or concerns.   You may also contact the Patient Navigator at [phone] should you have any questions or need assistance in obtaining follow up care.      Resources For Cancer Patients and their Caregivers ? American Cancer Society: Can assist with transportation, wigs, general needs, runs Look Good Feel Better.        1-888-227-6333 ? Cancer Care: Provides financial assistance, online support groups, medication/co-pay assistance.  1-800-813-HOPE (4673) ? [facility] Assists [place] cancer patients and their families through emotional , educational and financial support.   336-427-4357 ? [place] DSS Where to apply for food stamps, Medicaid and utility assistance. 336-342-1394 ? RCATS: Transportation to medical appointments. 336-347-2287 ? Social Security Administration: May apply for disability if have a Stage IV cancer. 336-342-7796 1-800-772-1213 ? [place] Aging, Disability and Transit Services: Assists with nutrition, care and transit needs. 336-349-2343          

## 2016-07-22 NOTE — Progress Notes (Signed)
Pain reassessed at 1300, patient reports that his pain is much better and rates it at a 1 at this time.  Please see flowsheet.  Patient tolerated infusion well today.  VSS.  Patient ambulatory and stable upon discharge from clinic.

## 2016-07-22 NOTE — Assessment & Plan Note (Addendum)
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  Undergoing concomitant chemoXRT consisting of Cisplatin every 3 weeks beginning on 07/01/2016.  Pre-treatment labs: CBC diff, CMET, magnesium.  I personally reviewed and went over laboratory results with the patient.  The results are noted within this dictation.  He is educated to call us with any issues or complications.  He will continue with IVF M-W-F during treatment.  Return in ~ 10 days for ongoing follow-up (11/8).

## 2016-07-22 NOTE — Patient Instructions (Signed)
[place] at [facility] Discharge Instructions  RECOMMENDATIONS MADE BY THE CONSULTANT AND ANY TEST RESULTS WILL BE SENT TO YOUR REFERRING PHYSICIAN.  You were seen by Gershon Mussel today Follow up in 10 days  Return in 3 weeks as scheduled  Continue IV fluids Mon, Wed, Fri  Thank you for choosing [place] at [facility] to provide your oncology and hematology care.  To afford each patient quality time with our provider, please arrive at least 15 minutes before your scheduled appointment time.   Beginning January 23rd 2017 lab work for the [facility] will be done in the  Main lab at [facility] on 1st floor. If you have a lab appointment with the [facility] please come in thru the  Main Entrance and check in at the main information desk  You need to re-schedule your appointment should you arrive 10 or more minutes late.  We strive to give you quality time with our providers, and arriving late affects you and other patients whose appointments are after yours.  Also, if you no show three or more times for appointments you may be dismissed from the clinic at the providers discretion.     Again, thank you for choosing [facility]  Our hope is that these requests will decrease the amount of time that you wait before being seen by our physicians.       _____________________________________________________________  Should you have questions after your visit to [facility], please contact our office at [phone] between the hours of 8:30 a.m. and 4:30 p.m.  Voicemails left after 4:30 p.m. will not be returned until the following business day.  For prescription refill requests, have your pharmacy contact our office.         Resources For Cancer Patients and their Caregivers ? American Cancer Society: Can assist with transportation, wigs, general needs, runs Look Good Feel Better.        (304) 358-9913 ? Cancer  Care: Provides financial assistance, online support groups, medication/co-pay assistance.  1-800-813-HOPE 9295828897) ? [facility] Assists [place] cancer patients and their families through emotional , educational and financial support.  (226)659-9838 ? [place] DSS Where to apply for food stamps, Medicaid and utility assistance. (437)111-1926 ? RCATS: Transportation to medical appointments. 534-797-2156 ? Social Security Administration: May apply for disability if have a Stage IV cancer. 402-413-4452 854-520-3883 ? [employer], Disability and Transit Services: Assists with nutrition, care and transit needs. [place] Support Programs: @10RELATIVEDAYS @ > Cancer Support Group  2nd Tuesday of the month 1pm-2pm, Journey Room  > Creative Journey  3rd Tuesday of the month 1130am-1pm, Journey Room  > Look Good Feel Better  1st Wednesday of the month 10am-12 noon, Journey Room (Call [place] to register [phone])

## 2016-07-25 ENCOUNTER — Encounter (HOSPITAL_COMMUNITY)
Admission: RE | Admit: 2016-07-25 | Discharge: 2016-07-25 | Disposition: A | Discharge status: Home / Self Care | Payer: Medicare Other | Source: Ambulatory Visit | Attending: Hematology & Oncology | Admitting: Hematology & Oncology

## 2016-07-25 DIAGNOSIS — C131 Malignant neoplasm of aryepiglottic fold, hypopharyngeal aspect: Secondary | ICD-10-CM | POA: Diagnosis not present

## 2016-07-25 MED ORDER — SODIUM CHLORIDE 0.9 % IV SOLN
INTRAVENOUS | Status: DC
  Administered 2016-07-25: 1000 mL via INTRAVENOUS

## 2016-07-25 MED ORDER — HEPARIN SOD (PORK) LOCK FLUSH 100 UNIT/ML IV SOLN
500.0000 [IU] | INTRAVENOUS | Status: AC | PRN
  Administered 2016-07-25: 500 [IU]

## 2016-07-25 MED ORDER — HEPARIN SOD (PORK) LOCK FLUSH 100 UNIT/ML IV SOLN
INTRAVENOUS | Status: AC
  Filled 2016-07-25: qty 5

## 2016-07-27 ENCOUNTER — Telehealth (HOSPITAL_COMMUNITY): Payer: Self-pay | Admitting: *Deleted

## 2016-07-27 ENCOUNTER — Encounter (HOSPITAL_COMMUNITY)
Admission: RE | Admit: 2016-07-27 | Discharge: 2016-07-27 | Disposition: A | Discharge status: Home / Self Care | Payer: Medicare Other | Source: Ambulatory Visit | Attending: Hematology & Oncology | Admitting: Hematology & Oncology

## 2016-07-27 DIAGNOSIS — G8929 Other chronic pain: Secondary | ICD-10-CM | POA: Diagnosis not present

## 2016-07-27 MED ORDER — SODIUM CHLORIDE 0.9 % IV SOLN
INTRAVENOUS | Status: DC
  Administered 2016-07-27: 1000 mL via INTRAVENOUS

## 2016-07-27 MED ORDER — HEPARIN SOD (PORK) LOCK FLUSH 100 UNIT/ML IV SOLN
INTRAVENOUS | Status: AC
  Filled 2016-07-27: qty 5

## 2016-07-27 MED ORDER — HEPARIN SOD (PORK) LOCK FLUSH 100 UNIT/ML IV SOLN
500.0000 [IU] | INTRAVENOUS | Status: AC | PRN
  Administered 2016-07-27: 500 [IU]

## 2016-07-28 ENCOUNTER — Encounter (HOSPITAL_COMMUNITY): Payer: Self-pay | Admitting: Hematology & Oncology

## 2016-07-28 ENCOUNTER — Other Ambulatory Visit: Payer: Self-pay | Admitting: Radiation Oncology

## 2016-07-29 ENCOUNTER — Encounter (HOSPITAL_COMMUNITY)
Admission: RE | Admit: 2016-07-29 | Discharge: 2016-07-29 | Disposition: A | Discharge status: Home / Self Care | Payer: Medicare Other | Source: Ambulatory Visit | Attending: Hematology & Oncology | Admitting: Hematology & Oncology

## 2016-07-29 ENCOUNTER — Encounter: Payer: Self-pay | Admitting: Dietician

## 2016-07-29 DIAGNOSIS — G8929 Other chronic pain: Secondary | ICD-10-CM | POA: Diagnosis not present

## 2016-07-29 MED ORDER — HEPARIN SOD (PORK) LOCK FLUSH 100 UNIT/ML IV SOLN
INTRAVENOUS | Status: AC
  Filled 2016-07-29: qty 5

## 2016-07-29 MED ORDER — SODIUM CHLORIDE 0.9% FLUSH
10.0000 mL | INTRAVENOUS | Status: AC | PRN
  Administered 2016-07-29: 10 mL

## 2016-07-29 MED ORDER — HEPARIN SOD (PORK) LOCK FLUSH 100 UNIT/ML IV SOLN
500.0000 [IU] | INTRAVENOUS | Status: AC | PRN
  Administered 2016-07-29: 500 [IU]

## 2016-07-29 MED ORDER — SODIUM CHLORIDE 0.9 % IV SOLN
INTRAVENOUS | Status: DC
  Administered 2016-07-29: 1000 mL via INTRAVENOUS

## 2016-07-29 NOTE — Progress Notes (Signed)
Follow up with H&N patient who is currently undergoing chemoradiation   Contacted Bruce [name] by visiting during IVF infusion  Wt Readings from Last 10 Encounters:  07/22/16 163 lb 6.4 oz (74.1 kg)  07/12/16 166 lb 12.8 oz (75.7 kg)  07/01/16 167 lb 12.8 oz (76.1 kg)  06/23/16 168 lb (76.2 kg)  06/15/16 169 lb 4.8 oz (76.8 kg)  06/09/16 168 lb 6.4 oz (76.4 kg)  05/16/16 170 lb (77.1 kg)  02/24/16 173 lb 6.4 oz (78.7 kg)  06/21/11 165 lb (74.8 kg)   Patient weight has decreased by 3 lbs in the last 10 days alone.   He much more hoarse and lethargic today. He denies any n/v. He does report constipation, but says he is "working on it". He was given a laxative recently. His main complaint is just overall severe malaise and fatigue.  RD went through 24 hr dietary recall. The first nutrition he typically receives is from an Ensure that he drinks prior to radiation. He says the second time he ate yesterday was between 1-2 pm. He said he eats a "pancake meal". He added extras, such as syrup, to these. This was his all he ate yesterday.   Unfortunately, he has not been using his tube at all. RD Again addressed how important it is that he use this, as a 3 lb wt loss in 1.5 weeks is a very large amount, especially this early in therapy. RD asked him to start infusing 2 cans of TF a day to start, though he likely needs at least 4 at this time, as am trying to keep goal attainable. Asked him to flush with 1 syringe before and after each can. Because this is not a lot, he can probably infuse at the same time he is currently eating which may be more convenient.   RD asked why he was so averse to using tube. It is at this time that patient voiced that his tube leaks when he infuses water. The leakage apparently is enough so that it gets on his skin.  Bruce [name] had an issue with tube leakage in the past and had an adjustment to his bumper made on 10/19. Apparently this has not fully solved problem. It is understandable that  Bruce [name] doesn't went to use his tube when the contents get all over his skin.   RD communicated this to PA. Hopefully this can be addressed very soon so that he can become comfortable with using his PEG.   RD to continue to closely monitor.   [name] RD, LDN, [facility] Pager: [phone] 07/29/2016 11:37 AM

## 2016-08-01 ENCOUNTER — Encounter (HOSPITAL_COMMUNITY)
Admission: RE | Admit: 2016-08-01 | Discharge: 2016-08-01 | Disposition: A | Discharge status: Home / Self Care | Payer: Medicare Other | Source: Ambulatory Visit | Attending: Hematology & Oncology | Admitting: Hematology & Oncology

## 2016-08-01 DIAGNOSIS — G8929 Other chronic pain: Secondary | ICD-10-CM | POA: Diagnosis not present

## 2016-08-01 MED ORDER — HEPARIN SOD (PORK) LOCK FLUSH 100 UNIT/ML IV SOLN
500.0000 [IU] | INTRAVENOUS | Status: AC | PRN
  Administered 2016-08-01: 500 [IU]

## 2016-08-01 MED ORDER — HEPARIN SOD (PORK) LOCK FLUSH 100 UNIT/ML IV SOLN
INTRAVENOUS | Status: AC
  Filled 2016-08-01: qty 5

## 2016-08-01 MED ORDER — SODIUM CHLORIDE 0.9 % IV SOLN
INTRAVENOUS | Status: DC
  Administered 2016-08-01: 12:00:00 via INTRAVENOUS

## 2016-08-02 ENCOUNTER — Other Ambulatory Visit (HOSPITAL_COMMUNITY): Payer: Self-pay | Admitting: Oncology

## 2016-08-02 DIAGNOSIS — C321 Malignant neoplasm of supraglottis: Secondary | ICD-10-CM

## 2016-08-02 MED ORDER — HYDROCODONE-ACETAMINOPHEN 7.5-325 MG/15ML PO SOLN: 15.0000 mL | ORAL | 0 refills | Status: DC | PRN

## 2016-08-03 ENCOUNTER — Encounter (HOSPITAL_COMMUNITY): Payer: Self-pay | Admitting: Oncology

## 2016-08-03 ENCOUNTER — Encounter (HOSPITAL_COMMUNITY)
Admission: RE | Admit: 2016-08-03 | Discharge: 2016-08-03 | Disposition: A | Discharge status: Home / Self Care | Payer: Medicare Other | Source: Ambulatory Visit | Attending: Hematology & Oncology | Admitting: Hematology & Oncology

## 2016-08-03 ENCOUNTER — Encounter (HOSPITAL_COMMUNITY): Payer: Medicare Other | Attending: Oncology | Admitting: Oncology

## 2016-08-03 ENCOUNTER — Encounter (HOSPITAL_COMMUNITY): Payer: Self-pay

## 2016-08-03 DIAGNOSIS — G8929 Other chronic pain: Secondary | ICD-10-CM | POA: Diagnosis not present

## 2016-08-03 DIAGNOSIS — C321 Malignant neoplasm of supraglottis: Secondary | ICD-10-CM

## 2016-08-03 DIAGNOSIS — F17210 Nicotine dependence, cigarettes, uncomplicated: Secondary | ICD-10-CM | POA: Insufficient documentation

## 2016-08-03 DIAGNOSIS — C131 Malignant neoplasm of aryepiglottic fold, hypopharyngeal aspect: Secondary | ICD-10-CM | POA: Insufficient documentation

## 2016-08-03 MED ORDER — MORPHINE SULFATE (PF) 4 MG/ML IV SOLN
INTRAVENOUS | Status: AC
  Filled 2016-08-03: qty 1

## 2016-08-03 MED ORDER — SODIUM CHLORIDE 0.9 % IV SOLN
INTRAVENOUS | Status: DC
  Administered 2016-08-03: 12:00:00 via INTRAVENOUS

## 2016-08-03 MED ORDER — SODIUM CHLORIDE 0.9 % IV SOLN: INTRAVENOUS | Status: DC

## 2016-08-03 MED ORDER — MORPHINE SULFATE (PF) 4 MG/ML IV SOLN
4.0000 mg | Freq: Once | INTRAVENOUS | Status: AC
  Administered 2016-08-03: 4 mg via INTRAVENOUS

## 2016-08-03 MED ORDER — HEPARIN SOD (PORK) LOCK FLUSH 100 UNIT/ML IV SOLN
500.0000 [IU] | Freq: Once | INTRAVENOUS | Status: AC
  Administered 2016-08-03: 500 [IU] via INTRAVENOUS
  Filled 2016-08-03: qty 5

## 2016-08-03 MED ORDER — MORPHINE SULFATE (PF) 2 MG/ML IV SOLN: 4.0000 mg | Freq: Once | INTRAVENOUS | Status: DC

## 2016-08-03 MED ORDER — HYDROCODONE-ACETAMINOPHEN 10-325 MG PO TABS
1.0000 | ORAL_TABLET | ORAL | 0 refills | Status: DC | PRN
Start: 2016-08-03 — End: 2016-08-26

## 2016-08-03 NOTE — Progress Notes (Signed)
Bruce Chroman, MD [place] [address]  Cancer of aryepiglottic fold or interarytenoid fold, laryngeal aspect ([facility]) - Plan: HYDROcodone-acetaminophen (NORCO) 10-325 MG tablet  CURRENT THERAPY: Cisplatin/XRT with curative intent  INTERVAL HISTORY: Bruce [name] 70 y.o. 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    Cancer of aryepiglottic fold or interarytenoid fold, laryngeal aspect ([facility])   05/16/2016 Procedure    Microdirect laryngoscopy and biopsy by Dr. Benjamine Mola      05/18/2016 Pathology Results    Diagnosis 1. Larynx, biopsy, Right aryepiglottic - INVASIVE POORLY DIFFERENTIATED CARCINOMA. - SEE COMMENT. 2. Larynx, biopsy, Left Aryepiglottic Fold - INVASIVE POORLY      06/14/2016 PET scan    1. Left-sided primary centered at the piriform sinus and aryepiglottic fold. 2. Bilateral cervical nodal metastasis. 3. No extracervical metastatic disease identified. 4. Bilateral pulmonary hypermetabolism is suspicious for developing interstitial lung disease (likely nonspecific interstitial pneumonia) superimposed upon centrilobular and paraseptal emphysema. Infectious process is felt less likely. 5. Hypermetabolic focus in the left side of the prostate is nonspecific. Consider correlation with PSA level and physical exam. 6. Right latissimus muscular hypermetabolism is likely incidental but warrants followup attention.      06/27/2016 Procedure     Port-A-Cath insertion, EGD with PEG placement by Dr. Arnoldo Morale      07/01/2016 -  Chemotherapy    The patient had palonosetron (ALOXI) injection 0.25 mg, 0.25 mg, Intravenous,  Once, 1 of 3 cycles  CISplatin (PLATINOL) 191 mg in sodium chloride 0.9 % 500 mL chemo infusion, 100 mg/m2 = 191 mg, Intravenous,  Once, 1 of 3 cycles  fosaprepitant (EMEND) 150 mg, dexamethasone (DECADRON) 12 mg in sodium chloride 0.9 % 145 mL IVPB, , Intravenous,   Once, 1 of 3 cycles  for chemotherapy treatment.        07/01/2016 -  Radiation Therapy         He is starting to have issues with his secretions.  He is seen spitting clear saliva 2-3 times during our visit.  He notes pain.  He is using PO Hydrocodone.  He does have liquid at home, and he notes that 1/2 the bottle remains.  He is resistant to any changes in his pain medications.  Weight loss persists and Bruce [name], RD is advised.  He is noted to have increased secretions.  He notes that it is clear/yellow in color.  He denies any fevers, cough, etc.  Review of Systems  Constitutional: Positive for weight loss. Negative for chills and fever.  HENT: Positive for sore throat. Negative for congestion and sinus pain.   Eyes: Negative.   Respiratory: Negative.  Negative for cough.   Cardiovascular: Negative.  Negative for chest pain.  Gastrointestinal: Negative for nausea and vomiting.  Genitourinary: Negative.   Musculoskeletal: Negative.   Skin: Negative.   Neurological: Negative.  Negative for weakness.  Endo/Heme/Allergies: Negative.   Psychiatric/Behavioral: Negative.     Past Medical History:  Diagnosis Date  . AAA (abdominal aortic aneurysm) ([facility])   . Anxiety   . Cancer (HCC)    laryngeal  . Cancer of aryepiglottic fold or interarytenoid fold, laryngeal aspect ([facility]) 06/15/2016  . Carpal tunnel syndrome   . Cellulitis   . COPD (chronic obstructive pulmonary disease) ([facility])   . Epigastric hernia   . GERD (gastroesophageal reflux disease)   . Hyperlipidemia   . Hypertension  used to take amlodipine, none now    Past Surgical History:  Procedure Laterality Date  . APPENDECTOMY    . DIRECT LARYNGOSCOPY N/A 05/16/2016   Procedure: MICRO DIRECT LARYNGOSCOPY WITH BIOPSY;  Surgeon: Leta Baptist, MD;  Location: [facility];  Service: ENT;  Laterality: N/A;  . ESOPHAGOGASTRODUODENOSCOPY N/A 06/27/2016   Procedure: ESOPHAGOGASTRODUODENOSCOPY (EGD);  Surgeon:  Aviva Signs, MD;  Location: [facility];  Service: General;  Laterality: N/A;  . HEMORROIDECTOMY    . IR GENERIC HISTORICAL  07/14/2016   IR RADIOLOGIST EVAL & MGMT [facility]  . IR GENERIC HISTORICAL  07/14/2016   IR PATIENT EVAL TECH 0-60 MINS [facility]  . PEG PLACEMENT N/A 06/27/2016   Procedure: PERCUTANEOUS ENDOSCOPIC GASTROSTOMY (PEG) PLACEMENT;  Surgeon: Aviva Signs, MD;  Location: [facility];  Service: General;  Laterality: N/A;  . PORTACATH PLACEMENT Left 06/27/2016   Procedure: INSERTION PORT-A-CATH;  Surgeon: Aviva Signs, MD;  Location: [facility];  Service: General;  Laterality: Left;  [facility] VASCULAR SURGERY Right 2008   stent to right common iliac artery    Family History  Problem Relation Age of Onset  . Heart failure Mother   . Coronary artery disease Father   . Stroke Father   . Alcoholism Father   . Heart disease Brother     MI    Social History   Social History  . Marital status: Widowed    Spouse name: N/A  . Number of children: N/A  . Years of education: N/A   Social History Main Topics  . Smoking status: Current Every Day Smoker    Packs/day: 0.00    Years: 12.00    Types: Cigars  . Smokeless tobacco: Never Used     Comment: 5-7 cigars daily  . Alcohol use No  . Drug use: No  . Sexual activity: Not Currently   Other Topics Concern  . None   Social History Narrative  . None     PHYSICAL EXAMINATION  ECOG PERFORMANCE STATUS: 1 - Symptomatic but completely ambulatory  Vitals:   08/03/16 1415  BP: (!) 141/68  Pulse: 86  Resp: 16  Temp: 98.9 F (37.2 C)     GENERAL:alert, no distress, cooperative and accompanied by daughter, stoic. SKIN: skin color, texture, turgor are normal, no rashes or significant lesions HEAD: Normocephalic, No masses, lesions, tenderness or abnormalities EYES: normal, EOMI, Conjunctiva are pink and non-injected EARS: External ears normal OROPHARYNX:lips, buccal mucosa, and tongue normal and mucous membranes are moist    NECK: supple, trachea midline, erythematous anterior cervical area. LYMPH:  not examined BREAST:not examined LUNGS: clear to auscultation  HEART: regular rate & rhythm ABDOMEN:abdomen soft BACK: Back symmetric, no curvature. EXTREMITIES:less then 2 second capillary refill, no joint deformities, effusion, or inflammation, no skin discoloration, no cyanosis  NEURO: alert & oriented x 3 with fluent speech, no focal motor/sensory deficits, gait normal   LABORATORY DATA: CBC    Component Value Date/Time   WBC 4.7 07/22/2016 0940   RBC 4.18 (L) 07/22/2016 0940   HGB 12.5 (L) 07/22/2016 0940   HCT 37.1 (L) 07/22/2016 0940   PLT 342 07/22/2016 0940   MCV 88.8 07/22/2016 0940   MCH 29.9 07/22/2016 0940   MCHC 33.7 07/22/2016 0940   RDW 15.2 07/22/2016 0940   LYMPHSABS 1.2 07/22/2016 0940   MONOABS 1.0 07/22/2016 0940   EOSABS 0.1 07/22/2016 0940   BASOSABS 0.1 07/22/2016 0940      Chemistry  Component Value Date/Time   NA 135 07/22/2016 0940   K 3.5 07/22/2016 0940   CL 103 07/22/2016 0940   CO2 27 07/22/2016 0940   BUN 8 07/22/2016 0940   CREATININE 0.92 07/22/2016 0940   CREATININE 0.83 06/16/2011 1155      Component Value Date/Time   CALCIUM 8.9 07/22/2016 0940   ALKPHOS 60 07/22/2016 0940   AST 21 07/22/2016 0940   ALT 18 07/22/2016 0940   BILITOT 0.2 (L) 07/22/2016 0940        PENDING LABS:   RADIOGRAPHIC STUDIES:  Ir Radiologist Eval & Mgmt  Result Date: 07/14/2016 Chipper Oman     07/14/2016 10:47 AM Patient came in with complaint of leaking around g tube insertion site.  The site was evaluated and appeared clean and dry with no skin breakdown or redness.  The bumper was adjusted slightly to see if it would lessen the drainage patient states he has at home.  Patient was advised that he could put vasoline on the skin under the gauze and bumper for a skin barrier to help prevent skin breakdown if he notices it becoming red.  Patient did not have any  other concerns and just wanted to make sure the tube was ok.  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  Undergoing concomitant chemoXRT consisting of Cisplatin every 3 weeks beginning on 07/01/2016.  He will continue with IVF M-W-F during treatment.  He requests a refill on his pain medication.  He is still swallowing his Norco.  He does have liquid pain medication.  He does not need a refill on his liquid pain medication.  He is resistant to changes in his pain medication regimen.  I think he would benefit from fentanyl patch in the near future.  Weight loss is noted and Bruce [name], RD is updated.  Nursing will check on his G-tube to see if he is still having issues with it leaking.  If so, we will refer to IR for evaluation/management.  He is having secretions that are clear/yellow in color.  He denies any infectious symptoms and refuses antibiotics.  He is advised to call back if anything changes.  We discussed his ongoing tobacco abuse.  He knows the risks of ongoing tobacco abuse. He was not too keen on discussing this topic.  He is educated to call us with any issues or complications.  Return as scheduled in ~ 10 days for next cycle of chemotherapy and follow-up.   ORDERS PLACED FOR THIS ENCOUNTER: No orders of the defined types were placed in this encounter.   MEDICATIONS PRESCRIBED THIS ENCOUNTER: Meds ordered this encounter  Medications  . HYDROcodone-acetaminophen (NORCO) 10-325 MG tablet    Sig: Take 1 tablet by mouth every 4 (four) hours as needed for moderate pain.    Dispense:  60 tablet    Refill:  0    Order Specific Question:   Supervising Provider    Answer:   Patrici Ranks [id number]    THERAPY PLAN:  Continue treatment as  outlined above with curative intent.  All questions were answered. The patient knows to call the clinic with any problems, questions or concerns. We can certainly see the patient much sooner if necessary.  Patient and plan discussed with  Dr. Ancil Linsey and she is in agreement with the aforementioned.   This note is electronically signed by: Doy Mince 08/03/2016 4:59 PM

## 2016-08-03 NOTE — Patient Instructions (Signed)
[place] at [facility] Discharge Instructions  RECOMMENDATIONS MADE BY THE CONSULTANT AND ANY TEST RESULTS WILL BE SENT TO YOUR REFERRING PHYSICIAN.  You were seen today by [name] PA-C. Refill given on pain medication. IVF as scheduled. Follow up as scheduled.    Thank you for choosing [place] at [facility] to provide your oncology and hematology care.  To afford each patient quality time with our provider, please arrive at least 15 minutes before your scheduled appointment time.   Beginning January 23rd 2017 lab work for the [facility] will be done in the  Main lab at [facility] on 1st floor. If you have a lab appointment with the [facility] please come in thru the  Main Entrance and check in at the main information desk  You need to re-schedule your appointment should you arrive 10 or more minutes late.  We strive to give you quality time with our providers, and arriving late affects you and other patients whose appointments are after yours.  Also, if you no show three or more times for appointments you may be dismissed from the clinic at the providers discretion.     Again, thank you for choosing [facility]  Our hope is that these requests will decrease the amount of time that you wait before being seen by our physicians.       _____________________________________________________________  Should you have questions after your visit to [facility], please contact our office at [phone] between the hours of 8:30 a.m. and 4:30 p.m.  Voicemails left after 4:30 p.m. will not be returned until the following business day.  For prescription refill requests, have your pharmacy contact our office.         Resources For Cancer Patients and their Caregivers ? American Cancer Society: Can assist with transportation, wigs, general needs, runs Look Good Feel Better.        9318436457 ? Cancer  Care: Provides financial assistance, online support groups, medication/co-pay assistance.  1-800-813-HOPE 615-764-3802) ? [facility] Assists [place] cancer patients and their families through emotional , educational and financial support.  440-590-4706 ? [place] DSS Where to apply for food stamps, Medicaid and utility assistance. 705 571 0777 ? [facility]: Transportation to medical appointments. 270-775-2832 ? Social Security Administration: May apply for disability if have a Stage IV cancer. ([phone] ? [employer], Disability and Transit Services: Assists with nutrition, care and transit needs. [place] Support Programs: @10RELATIVEDAYS @ > Cancer Support Group  2nd Tuesday of the month 1pm-2pm, Journey Room  > Creative Journey  3rd Tuesday of the month 1130am-1pm, Journey Room  > Look Good Feel Better  1st Wednesday of the month 10am-12 noon, Journey Room (Call [facility] to register [phone])

## 2016-08-03 NOTE — Progress Notes (Signed)
Barnetta Chapel Page RN from cancer center called and gave instructed that Dr Whitney Muse wanted pt to have morphine 4 mg IV now for pain. Pt's Goddaughter had called and said that pt was having an increased amount of pain, and that he would not ask for pt med. When I asked pt if he was having pain he said yes and that he could use some pain medication. Order in computer.

## 2016-08-03 NOTE — Assessment & Plan Note (Addendum)
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  Undergoing concomitant chemoXRT consisting of Cisplatin every 3 weeks beginning on 07/01/2016.  He will continue with IVF M-W-F during treatment.  He requests a refill on his pain medication.  He is still swallowing his Norco.  He does have liquid pain medication.  He does not need a refill on his liquid pain medication.  He is resistant to changes in his pain medication regimen.  I think he would benefit from fentanyl patch in the near future.  Weight loss is noted and [name], RD is updated.  Nursing will check on his G-tube to see if he is still having issues with it leaking.  If so, we will refer to IR for evaluation/management.  He is having secretions that are clear/yellow in color.  He denies any infectious symptoms and refuses antibiotics.  He is advised to call back if anything changes.  We discussed his ongoing tobacco abuse.  He knows the risks of ongoing tobacco abuse. He was not too keen on discussing this topic.  He is educated to call us with any issues or complications.  Return as scheduled in ~ 10 days for next cycle of chemotherapy and follow-up.

## 2016-08-05 ENCOUNTER — Telehealth (HOSPITAL_COMMUNITY): Payer: Self-pay

## 2016-08-05 ENCOUNTER — Encounter (HOSPITAL_COMMUNITY)
Admission: RE | Admit: 2016-08-05 | Discharge: 2016-08-05 | Disposition: A | Discharge status: Home / Self Care | Payer: Medicare Other | Source: Ambulatory Visit | Attending: Hematology & Oncology | Admitting: Hematology & Oncology

## 2016-08-05 ENCOUNTER — Encounter: Payer: Self-pay | Admitting: Dietician

## 2016-08-05 DIAGNOSIS — G8929 Other chronic pain: Secondary | ICD-10-CM | POA: Diagnosis not present

## 2016-08-05 MED ORDER — SODIUM CHLORIDE 0.9 % IV SOLN
INTRAVENOUS | Status: DC
  Administered 2016-08-05: 1000 mL via INTRAVENOUS

## 2016-08-05 MED ORDER — MORPHINE SULFATE (PF) 4 MG/ML IV SOLN
INTRAVENOUS | Status: AC
  Filled 2016-08-05: qty 1

## 2016-08-05 MED ORDER — HEPARIN SOD (PORK) LOCK FLUSH 100 UNIT/ML IV SOLN
500.0000 [IU] | INTRAVENOUS | Status: AC | PRN
  Administered 2016-08-05: 500 [IU]

## 2016-08-05 MED ORDER — HEPARIN SOD (PORK) LOCK FLUSH 100 UNIT/ML IV SOLN
INTRAVENOUS | Status: AC
  Filled 2016-08-05: qty 5

## 2016-08-05 MED ORDER — MORPHINE SULFATE (PF) 4 MG/ML IV SOLN
4.0000 mg | Freq: Once | INTRAVENOUS | Status: AC
  Administered 2016-08-05: 4 mg via INTRAVENOUS

## 2016-08-05 NOTE — Progress Notes (Signed)
Follow up with H&N patient who is currently undergoing chemoradiation   Contacted Pt by visiting during IVF infusion  Wt Readings from Last 10 Encounters:  08/03/16 156 lb 14.4 oz (71.2 kg)  07/22/16 163 lb 6.4 oz (74.1 kg)  07/12/16 166 lb 12.8 oz (75.7 kg)  07/01/16 167 lb 12.8 oz (76.1 kg)  06/23/16 168 lb (76.2 kg)  06/15/16 169 lb 4.8 oz (76.8 kg)  06/09/16 168 lb 6.4 oz (76.4 kg)  05/16/16 170 lb (77.1 kg)  02/24/16 173 lb 6.4 oz (78.7 kg)  06/21/11 165 lb (74.8 kg)   Patient weight has decreased by ~6-7 lbs in the last 2 weeks.   Pt is much more hoarse today; his base is barely audible. He is seen spitting up large amounts of saliva. Daughter is present.   RD recommended pineapple juice/papaya nectar for cutting through his thick secretions.   Pt/daughter report pt had his tube readjusted, for the second time, two days ago. He says he is no longer having leakage. Now that it is no longer leaking, he has begun to use his tube. He placed Two cans in yesterday, one in morning and one before bed. When asked how he felt after administering, he says he "didnt feel anything". He is flushing with 60 cc before and after each can and 30 cc a couple times the day during cleaning.  Pt's oral intake has greatly decreased. They report that yesterday, pt showed s/s of aspiration; He was coughing after eating and drinking. SLP notified who said she would see pt early next week. Pt is advised to follow the handout the SLP had given him in regards to strategies to minimize aspiration risk. Also asked him to recall the education on maintaining good oral care before drinking water. He is foretold that if he follows the strategies for reducing aspiration risk and still shows signs, SLP may advise that he become TF dependant. Therefore, he needs to become comfortable with his tube.  Unfortunately, at this time pt is infusing 2/7 of the recommended TF cans and 5/16 of recommended syringes free water. Much  of this is due to the mechanical issues he has had with his PEG.   He seems to not have had any trouble with his feeds. RD asked him to increase his cans to 4 and flushes to 10 starting today. We discussed regimen alterations if he can not tolerate this volume w/ flushes. We can further increase next week  Pt is currently suffering from severe constipation. Pt cannot remember the last time he had a BM. Explained that Part of this is likely due to insufficient fluid intake and inadequate flushing. Again emphasized the importance of increasing fluids. Daughter says they added an OTC stool softener to his tube yesterday using currect process for med administration.   Daughter asked about bolusing an 8 oz bottle of water. This would be fine.   RD spoke with MD about pt's severe constipation. MD said that pt, if he was willing, could try Mag Citrate which would almost assuredly alleviate his constipation. If this does not work, pt may have a hard stool blockage and would need to use an enema in conjunction with the mag citrate. Daughter was called later in the day and told this information.   Daughter asked about placing Pedialyte through tube to further hydrate.   Pt and daughter were both instructed to call RD with TF questions/concerns.   RD to continue to follow.   [name]  RD, LDN, CNSC Nutrition Pager: [phone] 08/05/2016 2:43 PM

## 2016-08-05 NOTE — Progress Notes (Signed)
Sharyn Lull in cancer center returned call. Stated that Dr Whitney Muse wants pt to be given Morphine 4 mg IV now for pain. I also reinforced with pt and daughter that he should take his pain med at home as instructed and that Dr Whitney Muse could given him something for his constipation if needed. Voiced understanding

## 2016-08-05 NOTE — Telephone Encounter (Signed)
-----   Message from Darlina Guys, LPN sent at [id number]  3:04 PM EST ----- Let message on daughters voicemail to let us know how dad's g-tube is doing.  Meredeth Ide ----- Message ----- From: [facility], PA-C Sent: 08/03/2016   3:51 PM To: Adella Nissen Nurse Ap  Please call daughter and ask if his g-tube is still leaking.  If yes, same, better, or worse since it was adjusted ~ 3 weeks ago.   If same or worse, we can get him back to IR for re-adjusting of the device.    TK

## 2016-08-05 NOTE — Progress Notes (Signed)
Mr Pacis stated that he would like something for pain. Sharyn Lull in cancer center notified. Will talk with Dr Whitney Muse and call me back.

## 2016-08-05 NOTE — Telephone Encounter (Signed)
Daughter called back and said G-tube was doing much better. Dr. Arnoldo Morale had "tightened it"

## 2016-08-08 ENCOUNTER — Encounter (HOSPITAL_COMMUNITY)
Admission: RE | Admit: 2016-08-08 | Discharge: 2016-08-08 | Disposition: A | Discharge status: Home / Self Care | Payer: Medicare Other | Source: Ambulatory Visit | Attending: Hematology & Oncology | Admitting: Hematology & Oncology

## 2016-08-08 ENCOUNTER — Telehealth (HOSPITAL_COMMUNITY): Payer: Self-pay

## 2016-08-08 ENCOUNTER — Other Ambulatory Visit (HOSPITAL_COMMUNITY): Payer: Self-pay | Admitting: Oncology

## 2016-08-08 ENCOUNTER — Ambulatory Visit (HOSPITAL_COMMUNITY): Payer: Medicare Other | Admitting: Speech Pathology

## 2016-08-08 DIAGNOSIS — G8929 Other chronic pain: Secondary | ICD-10-CM | POA: Diagnosis not present

## 2016-08-08 DIAGNOSIS — C321 Malignant neoplasm of supraglottis: Secondary | ICD-10-CM

## 2016-08-08 MED ORDER — SODIUM CHLORIDE 0.9 % IV SOLN
INTRAVENOUS | Status: DC
  Administered 2016-08-08: 1000 mL via INTRAVENOUS

## 2016-08-08 MED ORDER — FENTANYL 12 MCG/HR TD PT72: 12.5000 ug | MEDICATED_PATCH | TRANSDERMAL | 0 refills | Status: DC

## 2016-08-08 MED ORDER — HEPARIN SOD (PORK) LOCK FLUSH 100 UNIT/ML IV SOLN
500.0000 [IU] | INTRAVENOUS | Status: AC | PRN
  Administered 2016-08-08: 500 [IU]
  Filled 2016-08-08: qty 5

## 2016-08-08 NOTE — Telephone Encounter (Signed)
Patient was downstairs getting hydration and his nurse, Jacqlyn Larsen, called stating patient said he was having an increase in difficulty swallowing his pain pills. He takes the liquid pain medication during the day but states it does not last but an hour. He crushes a pain pill at night and takes before bed because the pain pills seem to last longer. After reviewing with Gershon Mussel, PA-C, new prescription for Fentanyl patch was written. Explained to daughter that we were starting with lowest dose of pain patches and it it didn't help we could go up in strength. He can still take his oral pain meds as directed while on pain patch. Daughter verbalized understanding.

## 2016-08-08 NOTE — Progress Notes (Signed)
Bruce [name] states that his feet have been swelling this weekend. Bilateral ankles edematous +1. Sharyn Lull in  [facility] notified. Will notify Dr Whitney Muse.

## 2016-08-10 ENCOUNTER — Encounter (HOSPITAL_COMMUNITY)
Admission: RE | Admit: 2016-08-10 | Discharge: 2016-08-10 | Disposition: A | Discharge status: Home / Self Care | Payer: Medicare Other | Source: Ambulatory Visit | Attending: Hematology & Oncology | Admitting: Hematology & Oncology

## 2016-08-10 ENCOUNTER — Encounter: Payer: Self-pay | Admitting: Dietician

## 2016-08-10 DIAGNOSIS — G8929 Other chronic pain: Secondary | ICD-10-CM | POA: Diagnosis not present

## 2016-08-10 MED ORDER — HEPARIN SOD (PORK) LOCK FLUSH 100 UNIT/ML IV SOLN
500.0000 [IU] | INTRAVENOUS | Status: AC | PRN
  Administered 2016-08-10: 500 [IU]

## 2016-08-10 MED ORDER — SODIUM CHLORIDE 0.9 % IV SOLN
INTRAVENOUS | Status: DC
  Administered 2016-08-10: 1000 mL via INTRAVENOUS

## 2016-08-10 MED ORDER — HEPARIN SOD (PORK) LOCK FLUSH 100 UNIT/ML IV SOLN
INTRAVENOUS | Status: AC
  Filled 2016-08-10: qty 5

## 2016-08-10 NOTE — Progress Notes (Signed)
Follow up with H&N patient who is currently undergoing chemoradiation   He has struggled to meet 50% of TF/Fluid recommendations.   Contacted Pt by visiting during IVF infusion  Wt Readings from Last 10 Encounters:  08/03/16 156 lb 14.4 oz (71.2 kg)  07/22/16 163 lb 6.4 oz (74.1 kg)  07/12/16 166 lb 12.8 oz (75.7 kg)  07/01/16 167 lb 12.8 oz (76.1 kg)  06/23/16 168 lb (76.2 kg)  06/15/16 169 lb 4.8 oz (76.8 kg)  06/09/16 168 lb 6.4 oz (76.4 kg)  05/16/16 170 lb (77.1 kg)  02/24/16 173 lb 6.4 oz (78.7 kg)  06/21/11 165 lb (74.8 kg)   Patient has not been weighed since last encounter. Expect further loss on next measurement.   Unfortunately, Pt has not met the goals set from last week. He was asked to increase TF cans to 4, but today says he is only doing 3, 2 in evening and 1 before bed. Last week he had been infusing 1 can in morning and 1 can in evening.    Additionally, he has not increased his flushes at all. He is still flushing with only 60 cc before and after each feed and 30cc 2x a day while cleaning his tube.   His PO intake is essentially nil. He reports severe dysphagia and food "wont go down". He cant swallow pills and his pain meds had to be changed to transdermal.   He has copious thick secretions that he continually coughed up during the encounter. He says this occurs all day long. He says he has a ~20 oz bottle of water that he uses to swish and spit to rid himself of the phlegm. RD asked him to drink the water and not spit it.   Last encounter he had reported severe constipation. He was told to take mag citrate. He says today that his daughter gave him 2 bottles. He drank "some out of one bottle". He reports "it helped some" and he was able to pass some stool on Saturday. He says he has not had a BM since then. His constipation is likely worsening his PO intake and his constipation is worsened by his fluid deficit (see bold below).   RD ask why he is opposed to using  the TF. He denies any intolerance at all. He says "I dont get hungry". It sounds that because he isnt hungry, he doesn't feel motivated at all to administer TF.   RD emphasized the fact that he is not receiving nearly enough fluid or calories. RD stated to him he is receiving 800cc fluid (with his minimal flushing) and just over 1000 kcals.  RD underlined the fact that this is not enough for a healthy individual let alone one in his situation. He is further told that his severe lack of fluids is no doubt contributing to his severe constipation and thick secretions. Explained that he can alleviate many of his severe factors by doing better with his Tube feeding/fluids.   Hopefully this put his deficiencies in perspective. RD again encouraged increasing flushes/Tube feeding and recommended using the mag citrate more  Pt has his final chemotherapy infusion on Friday.   SLP also met with patient today and told him to continue to continue with liquids as tolerated.    [name] RD, LDN, [place] Nutrition Pager: [phone] 08/10/2016 12:39 PM

## 2016-08-12 ENCOUNTER — Encounter (HOSPITAL_BASED_OUTPATIENT_CLINIC_OR_DEPARTMENT_OTHER): Payer: Medicare Other | Admitting: Hematology & Oncology

## 2016-08-12 ENCOUNTER — Encounter (HOSPITAL_BASED_OUTPATIENT_CLINIC_OR_DEPARTMENT_OTHER): Payer: Medicare Other

## 2016-08-12 ENCOUNTER — Encounter (HOSPITAL_COMMUNITY): Payer: Medicare Other

## 2016-08-12 ENCOUNTER — Encounter: Payer: Self-pay | Admitting: Dietician

## 2016-08-12 VITALS — BP 107/55 | HR 105 | Temp 99.0°F | Resp 18 | Wt 156.0 lb

## 2016-08-12 VITALS — BP 135/65 | HR 81 | Temp 98.0°F | Resp 18

## 2016-08-12 DIAGNOSIS — K5903 Drug induced constipation: Secondary | ICD-10-CM

## 2016-08-12 DIAGNOSIS — C321 Malignant neoplasm of supraglottis: Secondary | ICD-10-CM | POA: Diagnosis not present

## 2016-08-12 DIAGNOSIS — I739 Peripheral vascular disease, unspecified: Secondary | ICD-10-CM

## 2016-08-12 DIAGNOSIS — C770 Secondary and unspecified malignant neoplasm of lymph nodes of head, face and neck: Secondary | ICD-10-CM | POA: Diagnosis not present

## 2016-08-12 DIAGNOSIS — E876 Hypokalemia: Secondary | ICD-10-CM

## 2016-08-12 DIAGNOSIS — C131 Malignant neoplasm of aryepiglottic fold, hypopharyngeal aspect: Secondary | ICD-10-CM | POA: Diagnosis not present

## 2016-08-12 DIAGNOSIS — F17210 Nicotine dependence, cigarettes, uncomplicated: Secondary | ICD-10-CM | POA: Diagnosis not present

## 2016-08-12 DIAGNOSIS — K5900 Constipation, unspecified: Secondary | ICD-10-CM

## 2016-08-12 DIAGNOSIS — G893 Neoplasm related pain (acute) (chronic): Secondary | ICD-10-CM

## 2016-08-12 DIAGNOSIS — Z5111 Encounter for antineoplastic chemotherapy: Secondary | ICD-10-CM

## 2016-08-12 DIAGNOSIS — Z720 Tobacco use: Secondary | ICD-10-CM

## 2016-08-12 DIAGNOSIS — R634 Abnormal weight loss: Secondary | ICD-10-CM

## 2016-08-12 LAB — COMPREHENSIVE METABOLIC PANEL
ALBUMIN: 2.8 g/dL — AB (ref 3.5–5.0)
ALK PHOS: 53 U/L (ref 38–126)
ALT: 26 U/L (ref 17–63)
ANION GAP: 9 (ref 5–15)
AST: 27 U/L (ref 15–41)
BUN: 15 mg/dL (ref 6–20)
CALCIUM: 9 mg/dL (ref 8.9–10.3)
CO2: 28 mmol/L (ref 22–32)
Chloride: 98 mmol/L — ABNORMAL LOW (ref 101–111)
Creatinine, Ser: 0.78 mg/dL (ref 0.61–1.24)
GFR calc Af Amer: >60 mL/min (ref >60)
GFR calc non Af Amer: >60 mL/min (ref >60)
GLUCOSE: 136 mg/dL — AB (ref 65–99)
Potassium: 3.2 mmol/L — ABNORMAL LOW (ref 3.5–5.1)
SODIUM: 135 mmol/L (ref 135–145)
Total Bilirubin: 0.6 mg/dL (ref 0.3–1.2)
Total Protein: 6.3 g/dL — ABNORMAL LOW (ref 6.5–8.1)

## 2016-08-12 LAB — CBC WITH DIFFERENTIAL/PLATELET
BASOS ABS: 0 10*3/uL (ref 0.0–0.1)
BASOS PCT: 0 %
EOS ABS: 0.2 10*3/uL (ref 0.0–0.7)
Eosinophils Relative: 4 %
HCT: 33.5 % — ABNORMAL LOW (ref 39.0–52.0)
HEMOGLOBIN: 11.1 g/dL — AB (ref 13.0–17.0)
Lymphocytes Relative: 17 %
Lymphs Abs: 0.8 10*3/uL (ref 0.7–4.0)
MCH: 29.1 pg (ref 26.0–34.0)
MCHC: 33.1 g/dL (ref 30.0–36.0)
MCV: 87.9 fL (ref 78.0–100.0)
Monocytes Absolute: 1.3 10*3/uL — ABNORMAL HIGH (ref 0.1–1.0)
Monocytes Relative: 25 %
NEUTROS ABS: 2.7 10*3/uL (ref 1.7–7.7)
NEUTROS PCT: 54 %
Platelets: 316 10*3/uL (ref 150–400)
RBC: 3.81 MIL/uL — AB (ref 4.22–5.81)
RDW: 15.7 % — ABNORMAL HIGH (ref 11.5–15.5)
WBC: 5 10*3/uL (ref 4.0–10.5)

## 2016-08-12 LAB — MAGNESIUM: Magnesium: 1.7 mg/dL (ref 1.7–2.4)

## 2016-08-12 MED ORDER — POTASSIUM CHLORIDE 2 MEQ/ML IV SOLN
Freq: Once | INTRAVENOUS | Status: AC
  Administered 2016-08-12: 10:00:00 via INTRAVENOUS
  Filled 2016-08-12: qty 10

## 2016-08-12 MED ORDER — HEPARIN SOD (PORK) LOCK FLUSH 100 UNIT/ML IV SOLN
500.0000 [IU] | Freq: Once | INTRAVENOUS | Status: AC | PRN
  Administered 2016-08-12: 500 [IU]
  Filled 2016-08-12: qty 5

## 2016-08-12 MED ORDER — SODIUM CHLORIDE 0.9 % IV SOLN
100.0000 mg/m2 | Freq: Once | INTRAVENOUS | Status: AC
  Administered 2016-08-12: 191 mg via INTRAVENOUS
  Filled 2016-08-12: qty 191

## 2016-08-12 MED ORDER — SODIUM CHLORIDE 0.9 % IV SOLN
Freq: Once | INTRAVENOUS | Status: AC
  Administered 2016-08-12: 13:00:00 via INTRAVENOUS
  Filled 2016-08-12: qty 5

## 2016-08-12 MED ORDER — PALONOSETRON HCL INJECTION 0.25 MG/5ML
0.2500 mg | Freq: Once | INTRAVENOUS | Status: AC
  Administered 2016-08-12: 0.25 mg via INTRAVENOUS
  Filled 2016-08-12: qty 5

## 2016-08-12 MED ORDER — POTASSIUM CHLORIDE 20 MEQ/15ML (10%) PO SOLN
40.0000 meq | Freq: Every day | ORAL | Status: DC
  Filled 2016-08-12 (×2): qty 30

## 2016-08-12 MED ORDER — FENTANYL 25 MCG/HR TD PT72: 25.0000 ug | MEDICATED_PATCH | TRANSDERMAL | 0 refills | Status: DC

## 2016-08-12 MED ORDER — POTASSIUM CHLORIDE 20 MEQ/15ML (10%) PO SOLN
40.0000 meq | Freq: Once | ORAL | Status: AC
Start: 2016-08-12 — End: 2016-08-12
  Administered 2016-08-12: 40 meq via ORAL
  Filled 2016-08-12: qty 30

## 2016-08-12 MED ORDER — FENTANYL 25 MCG/HR TD PT72
25.0000 ug | MEDICATED_PATCH | TRANSDERMAL | Status: DC
  Administered 2016-08-12: 25 ug via TRANSDERMAL
  Filled 2016-08-12: qty 1

## 2016-08-12 NOTE — Patient Instructions (Signed)
[place] at [facility] Discharge Instructions  RECOMMENDATIONS MADE BY THE CONSULTANT AND ANY TEST RESULTS WILL BE SENT TO YOUR REFERRING PHYSICIAN.  Exam with Dr. Marki Frede Muse today. We will see you in one week for labs and a doctor's appointment. We have increased your fentanyl pain medication to a stronger dose.   Please see [name] as you leave for your appointments.    Thank you for choosing [place] at [facility] to provide your oncology and hematology care.  To afford each patient quality time with our provider, please arrive at least 15 minutes before your scheduled appointment time.   Beginning January 23rd 2017 lab work for the [facility] will be done in the  Main lab at [facility] on 1st floor. If you have a lab appointment with the [facility] please come in thru the  Main Entrance and check in at the main information desk  You need to re-schedule your appointment should you arrive 10 or more minutes late.  We strive to give you quality time with our providers, and arriving late affects you and other patients whose appointments are after yours.  Also, if you no show three or more times for appointments you may be dismissed from the clinic at the providers discretion.     Again, thank you for choosing [facility].  Our hope is that these requests will decrease the amount of time that you wait before being seen by our physicians.       _____________________________________________________________  Should you have questions after your visit to [facility], please contact our office at [phone] between the hours of 8:30 a.m. and 4:30 p.m.  Voicemails left after 4:30 p.m. will not be returned until the following business day.  For prescription refill requests, have your pharmacy contact our office.         Resources For Cancer Patients and their Caregivers ? American Cancer Society: Can assist with  transportation, wigs, general needs, runs Look Good Feel Better.        928-315-4089 ? Cancer Care: Provides financial assistance, online support groups, medication/co-pay assistance.  1-800-813-HOPE 574-377-9875) ? [place] Assists [place] cancer patients and their families through emotional , educational and financial support.  ([phone] ? [place] DSS Where to apply for food stamps, Medicaid and utility assistance. 669-402-0822 ? RCATS: Transportation to medical appointments. 952-278-8725 ? Social Security Administration: May apply for disability if have a Stage IV cancer. 661-628-2134 956-170-1709 ? [employer], Disability and Transit Services: Assists with nutrition, care and transit needs. [place] Support Programs: @10RELATIVEDAYS @ > Cancer Support Group  2nd Tuesday of the month 1pm-2pm, Journey Room  > Creative Journey  3rd Tuesday of the month 1130am-1pm, Journey Room  > Look Good Feel Better  1st Wednesday of the month 10am-12 noon, Journey Room (Call Shannon to register [phone])

## 2016-08-12 NOTE — Progress Notes (Signed)
Brief follow up with pt and his daughter due to daughter having questions regarding tube feeding and med administration.   Contacted Pt by visiting during infusion  Wt Readings from Last 10 Encounters:  08/12/16 156 lb (70.8 kg)  08/03/16 156 lb 14.4 oz (71.2 kg)  07/22/16 163 lb 6.4 oz (74.1 kg)  07/12/16 166 lb 12.8 oz (75.7 kg)  07/01/16 167 lb 12.8 oz (76.1 kg)  06/23/16 168 lb (76.2 kg)  06/15/16 169 lb 4.8 oz (76.8 kg)  06/09/16 168 lb 6.4 oz (76.4 kg)  05/16/16 170 lb (77.1 kg)  02/24/16 173 lb 6.4 oz (78.7 kg)   Patient is unchanged from 2 days ago. He still has not had a BM (has been 6-7 days), still administering 3 cans, still only infusing ~5 flushes of free water and still has had no PO intake.   RD re-emphasized that his constipation and secretions are worsened due to inadequate fluids. Explianed that even though pt will be finished with his tx soon, he will still have insufficient intake/orally for at least a few weeks after, therefore, he needs to increase the amount of cans he administers; his current amount is insufficient even for individual no longer on treatment.    To manage his constipation, pt had taken 1/2 bottle mag citrate to some effect. He has not taken this again. It is too painful for pt to swallow. RD went over how to appropriately administer medications via his tube. Recommended choosing liquid versions of laxatives/softeners if available. Recommended flushing with 90 cc before and after admin instead of 30. Gave handout with step by step instructions.   RD to continue to closely follow   [name] RD, LDN, [place] [facility] Pager: [phone] 08/12/2016 12:06 PM

## 2016-08-12 NOTE — Progress Notes (Signed)
[place]  PROGRESS NOTE  Patient Care Team: [name], MD as PCP - General (Internal Medicine) Cristal Deer, DPM (Podiatry)  CHIEF COMPLAINTS/PURPOSE OF CONSULTATION:  Bilateral laryngeal carcinoma  HISTORY OF PRESENTING ILLNESS:  Bruce [name] 70 y.o. male is here for a follow up of bilateral laryngeal carcinoma.   Bruce [name] returns to the cancer center today accompanied by his daughter. He presents in the treatment chair. Today he is receiving cycle 3 of head/neck cisplatin + XRT. He received radiation this morning before his appointment.   He states that he is constipated. He is taking stool softener, but still not having regular bowel movements. His daughter notes that this is because he does not have adequate oral intake.   He says he has a lot of pain in his throat. He is very hoarse. He has been trying to drink water. He has been putting water in his feeding tube. He puts 3 cans of food in his feeding tube and then flushes it with water. He does not like pain medication.   He says that the treatment has been very hard. He has been working with nutrition but continues to loose weight.   He says the feeding tube isn't making him nauseated, he simply has no desire for intake.   He is coughing up mucus.  He has been going to XRT daily. Sleep in intermittent. No fever or chills.    MEDICAL HISTORY:  Past Medical History:  Diagnosis Date  . AAA (abdominal aortic aneurysm) ([place])   . Anxiety   . Cancer (HCC)    laryngeal  . 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 Carpal tunnel syndrome   . Cellulitis   . COPD (chronic obstructive pulmonary disease) ([place])   . Epigastric hernia   . GERD (gastroesophageal reflux disease)   . Hyperlipidemia   . Hypertension     used to take amlodipine, none now    SURGICAL HISTORY: Past Surgical History:  Procedure Laterality Date  . APPENDECTOMY    . DIRECT  LARYNGOSCOPY N/A 05/16/2016   Procedure: MICRO DIRECT LARYNGOSCOPY WITH BIOPSY;  Surgeon: Leta Baptist, MD;  Location: [facility];  Service: ENT;  Laterality: N/A;  . ESOPHAGOGASTRODUODENOSCOPY N/A 06/27/2016   Procedure: ESOPHAGOGASTRODUODENOSCOPY (EGD);  Surgeon: Aviva Signs, MD;  Location: [facility];  Service: General;  Laterality: N/A;  . HEMORROIDECTOMY    . IR GENERIC HISTORICAL  07/14/2016   IR RADIOLOGIST EVAL & MGMT [facility]-INTERV RAD  . IR GENERIC HISTORICAL  07/14/2016   IR PATIENT EVAL TECH 0-60 MINS [facility]-INTERV RAD  . PEG PLACEMENT N/A 06/27/2016   Procedure: PERCUTANEOUS ENDOSCOPIC GASTROSTOMY (PEG) PLACEMENT;  Surgeon: Aviva Signs, MD;  Location: [facility];  Service: General;  Laterality: N/A;  . PORTACATH PLACEMENT Left 06/27/2016   Procedure: INSERTION PORT-A-CATH;  Surgeon: Aviva Signs, MD;  Location: [facility];  Service: General;  Laterality: Left;  Marland Kitchen VASCULAR SURGERY Right 2008   stent to right common iliac artery    SOCIAL HISTORY: Social History   Social History  . Marital status: Widowed    Spouse name: N/A  . Number of children: N/A  . Years of education: N/A   Occupational History  . Not on file.   Social History Main Topics  . Smoking status: Current Every Day Smoker    Packs/day: 0.00    Years: 12.00    Types: Cigars  . Smokeless tobacco: Never Used  Comment: 5-7 cigars daily  . Alcohol use No  . Drug use: No  . Sexual activity: Not Currently   Other Topics Concern  . Not on file   Social History Narrative  . No narrative on file  Patient lives alone.  He has 2 daughters; one lives close to him and one lives in [place]. He has four grandchildren.  He smokes about 5 cigars day. He used to smoke cigarettes and chew tobacco when he was young. 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  FAMILY HISTORY: Family History  Problem Relation Age of Onset  . Heart failure Mother   . Coronary artery disease  Father   . Stroke Father   . Alcoholism Father   . Heart disease Brother     MI  4 sisters; brother died from massive heart attack  Mother deceased at 24  Father deceased at 107 from massive heart attack.  ALLERGIES:  has No Known Allergies.  MEDICATIONS:  Current Outpatient Prescriptions  Medication Sig Dispense Refill  . albuterol (PROVENTIL HFA;VENTOLIN HFA) 108 (90 Base) MCG/ACT inhaler Inhale 1 puff into the lungs every 6 (six) hours as needed for wheezing or shortness of breath.    Marland Kitchen atorvastatin (LIPITOR) 10 MG tablet Take 10 mg by mouth daily.  12  . azithromycin (ZITHROMAX) 250 MG tablet     . Choline Fenofibrate (TRILIPIX) 135 MG capsule Take 135 mg by mouth daily.     Marland Kitchen CISPLATIN IV Inject into the vein.    Marland Kitchen dexamethasone (DECADRON) 4 MG tablet Take 2 tablets by mouth once a day on the day after chemotherapy and then take 2 tablets two times a day for 2 days. Take with food. 30 tablet 1  . fentaNYL (DURAGESIC - DOSED MCG/HR) 12 MCG/HR Place 1 patch (12.5 mcg total) onto the skin every 3 (three) days. 10 patch 0  . FLUCELVAX QUADRIVALENT SUSP     . fluconazole (DIFLUCAN) 100 MG tablet     . HYDROcodone-acetaminophen (HYCET) 7.5-325 mg/15 ml solution Take 15 mLs by mouth every 4 (four) hours as needed for moderate pain. 473 mL 0  . HYDROcodone-acetaminophen (NORCO) 10-325 MG tablet Take 1 tablet by mouth every 4 (four) hours as needed for moderate pain. 60 tablet 0  . lidocaine-prilocaine (EMLA) cream Apply 1 application topically as needed. 30 g 3  . LORazepam (ATIVAN) 1 MG tablet Take 1 mg by mouth Nightly.      . magic mouthwash w/lidocaine SOLN Swish and swallow 71m four times a day for mouth pain/soreness. 360 mL 1  . ondansetron (ZOFRAN) 8 MG tablet Take 1 tablet (8 mg total) by mouth every 8 (eight) hours as needed for nausea or vomiting. 30 tablet 2  . pantoprazole (PROTONIX) 40 MG tablet Take 40 mg by mouth 2 (two) times daily.  12  . prochlorperazine (COMPAZINE) 10 MG  tablet Take 1 tablet (10 mg total) by mouth every 6 (six) hours as needed for nausea or vomiting. 30 tablet 2  . traMADol (ULTRAM) 50 MG tablet Take 1 tablet (50 mg total) by mouth every 6 (six) hours as needed for moderate pain. 30 tablet 0   Current Facility-Administered Medications  Medication Dose Route Frequency Provider Last Rate Last Dose  . feeding supplement (OSMOLITE 1.5 CAL) liquid 237 mL  237 mL Per Tube Q24H SPatrici Ranks MD        Review of Systems  Constitutional: Negative.  Bad appetite (3 cans of food a day)  HENT: Positive for sore throat.        A lot of pain in his throat  Eyes: Negative.   Respiratory: Positive for sputum production.   Cardiovascular: Negative.   Gastrointestinal: Positive for constipation. Negative for nausea and vomiting.  Genitourinary: Negative.   Musculoskeletal: Negative.   Skin: Negative.   Neurological: Negative.   Endo/Heme/Allergies: Negative.   Psychiatric/Behavioral: Negative.           All other systems reviewed and are negative. 14 point ROS was done and is otherwise as detailed above or in HPI   PHYSICAL EXAMINATION: ECOG PERFORMANCE STATUS: 1 - Symptomatic but completely ambulatory  Vitals:   08/12/16 0940  BP: (!) 107/55  Pulse: (!) 105  Resp: 18  Temp: 99 F (37.2 C)   Filed Weights   08/12/16 0940  Weight: 156 lb (70.8 kg)     Physical Exam  Constitutional: He is oriented to person, place, and time and well-developed, well-nourished, and in no distress.  Obvious weight loss  HENT:  Head: Normocephalic and atraumatic.  Nose: Nose normal.  Mouth/Throat: No oropharyngeal exudate.  Thick sputum production  Eyes: Conjunctivae and EOM are normal. Pupils are equal, round, and reactive to light. Right eye exhibits no discharge. Left eye exhibits no discharge. No scleral icterus.  Neck: Normal range of motion. Neck supple. No tracheal deviation present. No thyromegaly present.  Radiation effects on  bilateral neck  Cardiovascular: Normal rate, regular rhythm and normal heart sounds.  Exam reveals no gallop and no friction rub.   No murmur heard. Pulmonary/Chest: Effort normal and breath sounds normal. He has no wheezes. He has no rales.  Abdominal: Soft. Bowel sounds are normal. He exhibits no distension and no mass. There is no tenderness. There is no rebound and no guarding.  Musculoskeletal: Normal range of motion. He exhibits no edema.  Lymphadenopathy:    He has no cervical adenopathy.    He has no axillary adenopathy.  Neurological: He is alert and oriented to person, place, and time. He has normal reflexes. No cranial nerve deficit. Gait normal. Coordination normal.  Skin: Skin is warm and dry. No rash noted.  Psychiatric: Mood, memory, affect and judgment normal.  Nursing note and vitals reviewed.  LABORATORY DATA:  I have reviewed the data as listed Results for Bruce, [name] (MRN [id number]) as of 08/12/2016 08:00   Ref. Range 08/12/2016 09:50  Sodium Latest Ref Range: 135 - 145 mmol/L 135  Potassium Latest Ref Range: 3.5 - 5.1 mmol/L 3.2 (L)  Chloride Latest Ref Range: 101 - 111 mmol/L 98 (L)  CO2 Latest Ref Range: 22 - 32 mmol/L 28  BUN Latest Ref Range: 6 - 20 mg/dL 15  Creatinine Latest Ref Range: 0.61 - 1.24 mg/dL 0.78  Calcium Latest Ref Range: 8.9 - 10.3 mg/dL 9.0  EGFR (Non-African Amer.) Latest Ref Range: >60 mL/min >60  EGFR (African American) Latest Ref Range: >60 mL/min >60  Glucose Latest Ref Range: 65 - 99 mg/dL 136 (H)  Anion gap Latest Ref Range: 5 - 15  9  Magnesium Latest Ref Range: 1.7 - 2.4 mg/dL 1.7  Alkaline Phosphatase Latest Ref Range: 38 - 126 U/L 53  Albumin Latest Ref Range: 3.5 - 5.0 g/dL 2.8 (L)  AST Latest Ref Range: 15 - 41 U/L 27  ALT Latest Ref Range: 17 - 63 U/L 26  Total Protein Latest Ref Range: 6.5 - 8.1 g/dL 6.3 (L)  Total Bilirubin Latest Ref Range: 0.3 - 1.2 mg/dL 0.6  WBC Latest Ref Range: 4.0 - 10.5 K/uL 5.0  RBC Latest  Ref Range: 4.22 - 5.81 MIL/uL 3.81 (L)  Hemoglobin Latest Ref Range: 13.0 - 17.0 g/dL 11.1 (L)  HCT Latest Ref Range: 39.0 - 52.0 % 33.5 (L)  MCV Latest Ref Range: 78.0 - 100.0 fL 87.9  MCH Latest Ref Range: 26.0 - 34.0 pg 29.1  MCHC Latest Ref Range: 30.0 - 36.0 g/dL 33.1  RDW Latest Ref Range: 11.5 - 15.5 % 15.7 (H)  Platelets Latest Ref Range: 150 - 400 K/uL 316  Neutrophils Latest Units: % 54  Lymphocytes Latest Units: % 17  Monocytes Relative Latest Units: % 25  Eosinophil Latest Units: % 4  Basophil Latest Units: % 0  NEUT# Latest Ref Range: 1.7 - 7.7 K/uL 2.7  Lymphocyte # Latest Ref Range: 0.7 - 4.0 K/uL 0.8  Monocyte # Latest Ref Range: 0.1 - 1.0 K/uL 1.3 (H)  Eosinophils Absolute Latest Ref Range: 0.0 - 0.7 K/uL 0.2  Basophils Absolute Latest Ref Range: 0.0 - 0.1 K/uL 0.0     RADIOGRAPHIC STUDIES: I have personally reviewed the radiological images as listed and agreed with the findings in the report. No results found.  Study Result   CLINICAL DATA:  Status post Port-a-Cath Placement, left upper chest.  EXAM: PORTABLE CHEST - 1 VIEW  COMPARISON:  PET-CT 06/14/2016 and previous  FINDINGS: Left subclavian port catheter, tip mid SVC. No pneumothorax. Moderate bilateral interstitial infiltrates or edema.  Heart size upper limits normal.  Tortuous atheromatous aorta.  No effusion.  Visualized bones unremarkable.  IMPRESSION: 1. Left subclavian port to mid SVC.  No pneumothorax. 2. Bilateral interstitial edema or infiltrates. 3.  Aortic Atherosclerosis (ICD10-170.0)   Electronically Signed   By: Lucrezia Europe M.D.   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 We discussed prevention and  treatment of constipation in detail.   He was given our head and neck cancer sheet in regards to mouth rinses, measures to do to manage mucositis and secretions.   We discussed going up on dosage for fentanyl. He is agreeable. He has liquid lortab and magic mouthwash and was encouraged to use these.   We discussed that he needs to be putting 6 or 7 cans of food a day into his feeding tube if he wants to maintain his weight. He is aware. I will have nutrition meet with him again.   We will give him potassium in his IVF today. Labs will be repeated again next week.   He will return for a follow up in a week.    Meds ordered this encounter  Medications  . fentaNYL (DURAGESIC - DOSED MCG/HR) 25 MCG/HR patch    Sig: Place 1 patch (25 mcg total) onto the skin every 3 (three) days.    Dispense:  10 patch    Refill:  0  . fentaNYL (DURAGESIC - dosed mcg/hr) patch 25 mcg    This document serves as a record of services personally performed by Ancil Linsey, MD. It was created on her behalf by Martinique Casey, a trained medical scribe. The creation of this record is based on the scribe's personal observations and the provider's statements  to them. This document has been checked and approved by the attending provider.  I have reviewed the above documentation for accuracy and completeness, and I agree with the above.  This note was electronically signed.    Molli Hazard, MD  08/12/2016 10:02 AM

## 2016-08-12 NOTE — Progress Notes (Signed)
Tolerated chemo well. Stable and ambulatory on discharge home with daughter. 

## 2016-08-12 NOTE — Patient Instructions (Signed)
[facility] Discharge Instructions for Patients Receiving Chemotherapy   Beginning January 23rd 2017 lab work for the [facility] will be done in the  Main lab at [facility] on 1st floor. If you have a lab appointment with the [facility] please come in thru the  Main Entrance and check in at the main information desk   Today you received the following chemotherapy agents Cisplatin.  To help prevent nausea and vomiting after your treatment, we encourage you to take your nausea medication as instructed.  If you develop nausea and vomiting, or diarrhea that is not controlled by your medication, call the clinic.  The clinic phone number is [phone]. Office hours are Monday-Friday 8:30am-5:00pm.  BELOW ARE SYMPTOMS THAT SHOULD BE REPORTED IMMEDIATELY:  *FEVER GREATER THAN 101.0 F  *CHILLS WITH OR WITHOUT FEVER  NAUSEA AND VOMITING THAT IS NOT CONTROLLED WITH YOUR NAUSEA MEDICATION  *UNUSUAL SHORTNESS OF BREATH  *UNUSUAL BRUISING OR BLEEDING  TENDERNESS IN MOUTH AND THROAT WITH OR WITHOUT PRESENCE OF ULCERS  *URINARY PROBLEMS  *BOWEL PROBLEMS  UNUSUAL RASH Items with * indicate a potential emergency and should be followed up as soon as possible. If you have an emergency after office hours please contact your primary care physician or go to the nearest emergency department.  Please call the clinic during office hours if you have any questions or concerns.   You may also contact the Patient Navigator at [phone] should you have any questions or need assistance in obtaining follow up care.      Resources For Cancer Patients and their Caregivers ? American Cancer Society: Can assist with transportation, wigs, general needs, runs Look Good Feel Better.        478-828-6702 ? Cancer Care: Provides financial assistance, online support groups, medication/co-pay assistance.  1-800-813-HOPE 220-286-0935) ? [place] Assists  [place] cancer patients and their families through emotional , educational and financial support.  (951) [phone] ? [place] DSS Where to apply for food stamps, Medicaid and utility assistance. (401)045-7879 ? RCATS: Transportation to medical appointments. 902-494-2651 ? Social Security Administration: May apply for disability if have a Stage IV cancer. 4455851828 3370425745 ? [employer], Disability and Transit Services: Assists with nutrition, care and transit needs. [phone]

## 2016-08-15 ENCOUNTER — Encounter (HOSPITAL_COMMUNITY): Payer: Self-pay

## 2016-08-15 ENCOUNTER — Encounter (HOSPITAL_COMMUNITY)
Admission: RE | Admit: 2016-08-15 | Discharge: 2016-08-15 | Disposition: A | Discharge status: Home / Self Care | Payer: Medicare Other | Source: Ambulatory Visit | Attending: Hematology & Oncology | Admitting: Hematology & Oncology

## 2016-08-15 DIAGNOSIS — G8929 Other chronic pain: Secondary | ICD-10-CM | POA: Diagnosis not present

## 2016-08-15 MED ORDER — SODIUM CHLORIDE 0.9 % IV SOLN
INTRAVENOUS | Status: DC
  Administered 2016-08-15: 1000 mL via INTRAVENOUS

## 2016-08-15 MED ORDER — HEPARIN SOD (PORK) LOCK FLUSH 100 UNIT/ML IV SOLN
500.0000 [IU] | INTRAVENOUS | Status: AC | PRN
  Administered 2016-08-15: 500 [IU]
  Filled 2016-08-15: qty 5

## 2016-08-17 ENCOUNTER — Encounter (HOSPITAL_COMMUNITY)
Admission: RE | Admit: 2016-08-17 | Discharge: 2016-08-17 | Disposition: A | Discharge status: Home / Self Care | Payer: Medicare Other | Source: Ambulatory Visit | Attending: Hematology & Oncology | Admitting: Hematology & Oncology

## 2016-08-17 DIAGNOSIS — G8929 Other chronic pain: Secondary | ICD-10-CM | POA: Diagnosis not present

## 2016-08-17 MED ORDER — SODIUM CHLORIDE 0.9% FLUSH: 10.0000 mL | INTRAVENOUS | Status: DC | PRN

## 2016-08-17 MED ORDER — HEPARIN SOD (PORK) LOCK FLUSH 100 UNIT/ML IV SOLN
INTRAVENOUS | Status: AC
  Filled 2016-08-17: qty 5

## 2016-08-17 MED ORDER — HEPARIN SOD (PORK) LOCK FLUSH 100 UNIT/ML IV SOLN
500.0000 [IU] | Freq: Once | INTRAVENOUS | Status: AC
  Administered 2016-08-17: 500 [IU] via INTRAVENOUS

## 2016-08-17 MED ORDER — SODIUM CHLORIDE 0.9 % IV SOLN
INTRAVENOUS | Status: DC
  Administered 2016-08-17: 1000 mL via INTRAVENOUS

## 2016-08-21 ENCOUNTER — Inpatient Hospital Stay (HOSPITAL_COMMUNITY)
Admission: EM | Admit: 2016-08-21 | Discharge: 2016-08-26 | DRG: 682 | Disposition: A | Discharge status: Home Health | Payer: Medicare Other | Attending: Internal Medicine | Admitting: Internal Medicine

## 2016-08-21 ENCOUNTER — Emergency Department (HOSPITAL_COMMUNITY): Payer: Medicare Other

## 2016-08-21 ENCOUNTER — Other Ambulatory Visit: Payer: Self-pay

## 2016-08-21 ENCOUNTER — Encounter (HOSPITAL_COMMUNITY): Payer: Self-pay | Admitting: Emergency Medicine

## 2016-08-21 DIAGNOSIS — Z931 Gastrostomy status: Secondary | ICD-10-CM

## 2016-08-21 DIAGNOSIS — C321 Malignant neoplasm of supraglottis: Secondary | ICD-10-CM | POA: Diagnosis not present

## 2016-08-21 DIAGNOSIS — R634 Abnormal weight loss: Secondary | ICD-10-CM | POA: Diagnosis present

## 2016-08-21 DIAGNOSIS — R531 Weakness: Secondary | ICD-10-CM | POA: Diagnosis not present

## 2016-08-21 DIAGNOSIS — N179 Acute kidney failure, unspecified: Secondary | ICD-10-CM | POA: Diagnosis not present

## 2016-08-21 DIAGNOSIS — K5900 Constipation, unspecified: Secondary | ICD-10-CM | POA: Diagnosis present

## 2016-08-21 DIAGNOSIS — E8809 Other disorders of plasma-protein metabolism, not elsewhere classified: Secondary | ICD-10-CM | POA: Diagnosis present

## 2016-08-21 DIAGNOSIS — F419 Anxiety disorder, unspecified: Secondary | ICD-10-CM | POA: Diagnosis present

## 2016-08-21 DIAGNOSIS — J449 Chronic obstructive pulmonary disease, unspecified: Secondary | ICD-10-CM | POA: Diagnosis present

## 2016-08-21 DIAGNOSIS — R509 Fever, unspecified: Secondary | ICD-10-CM

## 2016-08-21 DIAGNOSIS — K219 Gastro-esophageal reflux disease without esophagitis: Secondary | ICD-10-CM | POA: Diagnosis present

## 2016-08-21 DIAGNOSIS — M6281 Muscle weakness (generalized): Secondary | ICD-10-CM

## 2016-08-21 DIAGNOSIS — Z79899 Other long term (current) drug therapy: Secondary | ICD-10-CM

## 2016-08-21 DIAGNOSIS — I714 Abdominal aortic aneurysm, without rupture: Secondary | ICD-10-CM | POA: Diagnosis present

## 2016-08-21 DIAGNOSIS — E785 Hyperlipidemia, unspecified: Secondary | ICD-10-CM | POA: Diagnosis present

## 2016-08-21 DIAGNOSIS — E43 Unspecified severe protein-calorie malnutrition: Secondary | ICD-10-CM | POA: Insufficient documentation

## 2016-08-21 DIAGNOSIS — C329 Malignant neoplasm of larynx, unspecified: Secondary | ICD-10-CM

## 2016-08-21 DIAGNOSIS — T451X5A Adverse effect of antineoplastic and immunosuppressive drugs, initial encounter: Secondary | ICD-10-CM | POA: Diagnosis present

## 2016-08-21 DIAGNOSIS — R627 Adult failure to thrive: Secondary | ICD-10-CM | POA: Diagnosis present

## 2016-08-21 DIAGNOSIS — D649 Anemia, unspecified: Secondary | ICD-10-CM | POA: Diagnosis present

## 2016-08-21 DIAGNOSIS — Z6822 Body mass index (BMI) 22.0-22.9, adult: Secondary | ICD-10-CM

## 2016-08-21 DIAGNOSIS — G893 Neoplasm related pain (acute) (chronic): Secondary | ICD-10-CM

## 2016-08-21 DIAGNOSIS — K5903 Drug induced constipation: Secondary | ICD-10-CM | POA: Diagnosis present

## 2016-08-21 DIAGNOSIS — D696 Thrombocytopenia, unspecified: Secondary | ICD-10-CM | POA: Diagnosis present

## 2016-08-21 DIAGNOSIS — F17290 Nicotine dependence, other tobacco product, uncomplicated: Secondary | ICD-10-CM | POA: Diagnosis present

## 2016-08-21 DIAGNOSIS — E876 Hypokalemia: Secondary | ICD-10-CM | POA: Diagnosis present

## 2016-08-21 DIAGNOSIS — T402X5A Adverse effect of other opioids, initial encounter: Secondary | ICD-10-CM | POA: Diagnosis present

## 2016-08-21 DIAGNOSIS — D6959 Other secondary thrombocytopenia: Secondary | ICD-10-CM | POA: Diagnosis present

## 2016-08-21 DIAGNOSIS — E860 Dehydration: Secondary | ICD-10-CM

## 2016-08-21 LAB — BASIC METABOLIC PANEL
ANION GAP: 10 (ref 5–15)
BUN: 58 mg/dL — ABNORMAL HIGH (ref 6–20)
CALCIUM: 8.3 mg/dL — AB (ref 8.9–10.3)
CHLORIDE: 88 mmol/L — AB (ref 101–111)
CO2: 34 mmol/L — AB (ref 22–32)
Creatinine, Ser: 2.46 mg/dL — ABNORMAL HIGH (ref 0.61–1.24)
GFR calc non Af Amer: 25 mL/min — ABNORMAL LOW (ref >60)
GFR, EST AFRICAN AMERICAN: 29 mL/min — AB (ref >60)
Glucose, Bld: 181 mg/dL — ABNORMAL HIGH (ref 65–99)
Potassium: 2.5 mmol/L — CL (ref 3.5–5.1)
Sodium: 132 mmol/L — ABNORMAL LOW (ref 135–145)

## 2016-08-21 LAB — HEPATIC FUNCTION PANEL
ALBUMIN: 2.6 g/dL — AB (ref 3.5–5.0)
ALT: 37 U/L (ref 17–63)
AST: 22 U/L (ref 15–41)
Alkaline Phosphatase: 64 U/L (ref 38–126)
BILIRUBIN TOTAL: 0.6 mg/dL (ref 0.3–1.2)
Bilirubin, Direct: 0.1 mg/dL (ref 0.1–0.5)
Indirect Bilirubin: 0.5 mg/dL (ref 0.3–0.9)
TOTAL PROTEIN: 5.6 g/dL — AB (ref 6.5–8.1)

## 2016-08-21 LAB — I-STAT TROPONIN, ED: TROPONIN I, POC: 0 ng/mL (ref 0.00–0.08)

## 2016-08-21 LAB — CBC
HCT: 31.3 % — ABNORMAL LOW (ref 39.0–52.0)
HEMOGLOBIN: 10.4 g/dL — AB (ref 13.0–17.0)
MCH: 28.6 pg (ref 26.0–34.0)
MCHC: 33.2 g/dL (ref 30.0–36.0)
MCV: 86 fL (ref 78.0–100.0)
Platelets: 124 10*3/uL — ABNORMAL LOW (ref 150–400)
RBC: 3.64 MIL/uL — AB (ref 4.22–5.81)
RDW: 15.3 % (ref 11.5–15.5)
WBC: 7.4 10*3/uL (ref 4.0–10.5)

## 2016-08-21 LAB — MAGNESIUM: Magnesium: 1.4 mg/dL — ABNORMAL LOW (ref 1.7–2.4)

## 2016-08-21 MED ORDER — SODIUM CHLORIDE 0.9 % IV BOLUS (SEPSIS)
1000.0000 mL | Freq: Once | INTRAVENOUS | Status: AC
  Administered 2016-08-21: 1000 mL via INTRAVENOUS

## 2016-08-21 MED ORDER — LIDOCAINE VISCOUS 2 % MT SOLN: 5.0000 mL | Freq: Four times a day (QID) | OROMUCOSAL | Status: DC | PRN

## 2016-08-21 MED ORDER — BISACODYL 10 MG RE SUPP: 10.0000 mg | Freq: Every day | RECTAL | Status: DC | PRN

## 2016-08-21 MED ORDER — HYDROCODONE-ACETAMINOPHEN 7.5-325 MG/15ML PO SOLN
15.0000 mL | ORAL | Status: DC | PRN
  Administered 2016-08-22: 15 mL via ORAL
  Filled 2016-08-21: qty 15

## 2016-08-21 MED ORDER — ALBUTEROL SULFATE (2.5 MG/3ML) 0.083% IN NEBU
3.0000 mL | INHALATION_SOLUTION | Freq: Four times a day (QID) | RESPIRATORY_TRACT | Status: DC | PRN
  Administered 2016-08-21: 3 mL via RESPIRATORY_TRACT
  Filled 2016-08-21: qty 3

## 2016-08-21 MED ORDER — PANTOPRAZOLE SODIUM 40 MG PO PACK
40.0000 mg | PACK | Freq: Two times a day (BID) | ORAL | Status: DC
  Administered 2016-08-21 – 2016-08-26 (×10): 40 mg
  Filled 2016-08-21 (×11): qty 20

## 2016-08-21 MED ORDER — MAGNESIUM SULFATE 2 GM/50ML IV SOLN
2.0000 g | Freq: Once | INTRAVENOUS | Status: AC
  Administered 2016-08-21: 2 g via INTRAVENOUS
  Filled 2016-08-21: qty 50

## 2016-08-21 MED ORDER — OSMOLITE 1.5 CAL PO LIQD
273.0000 mL | Freq: Every day | ORAL | Status: DC
  Administered 2016-08-22 – 2016-08-24 (×4): 273 mL

## 2016-08-21 MED ORDER — PROCHLORPERAZINE MALEATE 5 MG PO TABS: 10.0000 mg | ORAL_TABLET | Freq: Four times a day (QID) | ORAL | Status: DC | PRN

## 2016-08-21 MED ORDER — MAGIC MOUTHWASH W/LIDOCAINE: 10.0000 mL | Freq: Four times a day (QID) | ORAL | Status: DC | PRN

## 2016-08-21 MED ORDER — SODIUM CHLORIDE 0.9 % IV SOLN: INTRAVENOUS | Status: DC

## 2016-08-21 MED ORDER — OSMOLITE 1.5 CAL PO LIQD
474.0000 mL | Freq: Three times a day (TID) | ORAL | Status: DC
  Administered 2016-08-22 – 2016-08-24 (×7): 474 mL
  Administered 2016-08-24: 240 mL
  Administered 2016-08-25 – 2016-08-26 (×4): 474 mL

## 2016-08-21 MED ORDER — LORAZEPAM 2 MG/ML IJ SOLN: 1.0000 mg | INTRAMUSCULAR | Status: DC | PRN

## 2016-08-21 MED ORDER — SODIUM CHLORIDE 0.9% FLUSH
3.0000 mL | Freq: Two times a day (BID) | INTRAVENOUS | Status: DC
  Administered 2016-08-22 – 2016-08-26 (×5): 3 mL via INTRAVENOUS

## 2016-08-21 MED ORDER — MORPHINE SULFATE (PF) 4 MG/ML IV SOLN
4.0000 mg | Freq: Once | INTRAVENOUS | Status: AC
  Administered 2016-08-21: 4 mg via INTRAVENOUS
  Filled 2016-08-21: qty 1

## 2016-08-21 MED ORDER — HEPARIN SODIUM (PORCINE) 5000 UNIT/ML IJ SOLN
5000.0000 [IU] | Freq: Three times a day (TID) | INTRAMUSCULAR | Status: DC
  Administered 2016-08-21 – 2016-08-26 (×14): 5000 [IU] via SUBCUTANEOUS
  Filled 2016-08-21 (×15): qty 1

## 2016-08-21 MED ORDER — ENSURE ENLIVE PO LIQD
237.0000 mL | Freq: Two times a day (BID) | ORAL | Status: DC
  Administered 2016-08-22 – 2016-08-26 (×4): 237 mL via ORAL

## 2016-08-21 MED ORDER — POTASSIUM CHLORIDE 10 MEQ/100ML IV SOLN
10.0000 meq | INTRAVENOUS | Status: AC
  Administered 2016-08-21 – 2016-08-22 (×4): 10 meq via INTRAVENOUS
  Filled 2016-08-21: qty 100

## 2016-08-21 MED ORDER — FLEET ENEMA 7-19 GM/118ML RE ENEM
1.0000 | ENEMA | Freq: Once | RECTAL | Status: AC | PRN
  Administered 2016-08-21: 1 via RECTAL

## 2016-08-21 MED ORDER — POTASSIUM CHLORIDE 20 MEQ/15ML (10%) PO SOLN
40.0000 meq | Freq: Once | ORAL | Status: AC
  Administered 2016-08-21: 40 meq
  Filled 2016-08-21: qty 30

## 2016-08-21 MED ORDER — MAGIC MOUTHWASH
5.0000 mL | Freq: Four times a day (QID) | ORAL | Status: DC | PRN
  Administered 2016-08-22: 5 mL via ORAL
  Filled 2016-08-21 (×2): qty 5

## 2016-08-21 MED ORDER — MORPHINE SULFATE (PF) 2 MG/ML IV SOLN: 2.0000 mg | INTRAVENOUS | Status: DC | PRN

## 2016-08-21 MED ORDER — ZOLPIDEM TARTRATE 5 MG PO TABS
5.0000 mg | ORAL_TABLET | Freq: Every evening | ORAL | Status: DC | PRN
  Administered 2016-08-24: 5 mg via ORAL
  Filled 2016-08-21: qty 1

## 2016-08-21 MED ORDER — POLYETHYLENE GLYCOL 3350 17 G PO PACK
17.0000 g | PACK | Freq: Every day | ORAL | Status: DC | PRN
  Administered 2016-08-22: 17 g via ORAL
  Filled 2016-08-21: qty 1

## 2016-08-21 MED ORDER — FENTANYL 25 MCG/HR TD PT72
25.0000 ug | MEDICATED_PATCH | TRANSDERMAL | Status: DC
  Administered 2016-08-21 – 2016-08-24 (×2): 25 ug via TRANSDERMAL
  Filled 2016-08-21 (×2): qty 1

## 2016-08-21 NOTE — ED Triage Notes (Addendum)
Patient c/o generalized weakness that started 3 days ago. Per patient productive cough with clear sputum and sore throat. Denies any fevers, nausea, vomiting, or diarrhea. Patient is being treated for esophogeal cancer and is receiving radiation, last had radiation on Wednesday. Patient does report shortness of breath and mid-sternal chest pain.

## 2016-08-21 NOTE — H&P (Signed)
History and Physical    Bruce [name] [id number] DOB: 1945-12-21 DOA: 08/21/2016  PCP: [name], MD   Patient coming from: Home  Chief Complaint: Gen weakness, malaise, pedal edema   HPI: Bruce [name] is a 70 y.o. 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 Patient underwent his third cycle of cisplatin with radiation to the head and neck in the past week and has since been progressively weak and with a general sense of malaise. He reports not feeling as poorly after the first 2 cycles of treatment, but notes that he was able to continue oral intake in those instances, but has now been dependent on a G-tube for the past 3 weeks or so. He denies any fevers or chills and denies chest pain or palpitations, but notes a 10 pound weight loss in the last month. Over the past week, the patient is also noted swelling in the bilateral feet and has a slight cough productive of thick clear sputum. Patient denies abdominal pain, nausea, or vomiting, but notes severe constipation which has not improved despite treatment with mag citrate at home. There's been no recent fall or trauma and the patient denies headache, change in vision or hearing, loss of coordination, or focal numbness or weakness. There has been no rash or wound, and no dysuria or flank pain.   ED Course: Upon arrival to the ED, patient is found to be afebrile, saturating well on room air, with soft blood pressure, but vitals otherwise stable. EKG features sinus rhythm with septal Q waves and chest x-ray is notable for pulmonary vascular congestion and probable mild edema. BMP features a sodium 132, chloride 88, potassium 2.5, BUN 58, and serum creatinine at 2.46, up from an apparent baseline of 0.8. CBC features a normocytic anemia with hemoglobin of 10.4, down from 11.1  earlier this month. CBC is also notable for mild thrombocytopenia with platelets 124,000. Troponin is undetectable. Patient was given 2 L normal saline in the emergency department and 40 mEq oral potassium. He was also treated with morphine for throat pain. He has remained hemodynamically stable in the ED and in no apparent respiratory distress. He will be observed on the telemetry unit for ongoing evaluation and management of acute kidney injury with critical hypokalemia and intravascular volume depletion, complicated by peripheral edema and probable early pulmonary edema.  Review of Systems:  All other systems reviewed and apart from HPI, are negative.  Past Medical History:  Diagnosis Date  . AAA (abdominal aortic aneurysm) ([place])   . Anxiety   . Cancer (HCC)    laryngeal  . 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 Carpal tunnel syndrome   . Cellulitis   . COPD (chronic obstructive pulmonary disease) ([place])   . Epigastric hernia   . GERD (gastroesophageal reflux disease)   . Hyperlipidemia   . Hypertension     used to take amlodipine, none now    Past Surgical History:  Procedure Laterality Date  . APPENDECTOMY    . DIRECT LARYNGOSCOPY N/A 05/16/2016   Procedure: MICRO DIRECT LARYNGOSCOPY WITH BIOPSY;  Surgeon: Leta Baptist, MD;  Location: [facility];  Service: ENT;  Laterality: N/A;  . ESOPHAGOGASTRODUODENOSCOPY N/A 06/27/2016   Procedure: ESOPHAGOGASTRODUODENOSCOPY (EGD);  Surgeon: Aviva Signs, MD;  Location: [facility];  Service: General;  Laterality:  N/A;  . HEMORROIDECTOMY    . IR GENERIC HISTORICAL  07/14/2016   IR RADIOLOGIST EVAL & MGMT [facility]  . IR GENERIC HISTORICAL  07/14/2016   IR PATIENT EVAL TECH 0-60 MINS [facility]  . PEG PLACEMENT N/A 06/27/2016   Procedure: PERCUTANEOUS ENDOSCOPIC GASTROSTOMY (PEG) PLACEMENT;  Surgeon: Aviva Signs, MD;  Location: [facility];  Service: General;  Laterality: N/A;  .  PORTACATH PLACEMENT Left 06/27/2016   Procedure: INSERTION PORT-A-CATH;  Surgeon: Aviva Signs, MD;  Location: [facility];  Service: General;  Laterality: Left;  Marland Kitchen VASCULAR SURGERY Right 2008   stent to right common iliac artery     reports that he has been smoking Cigars.  He has been smoking about 0.00 packs per day for the past 12.00 years. He has never used smokeless tobacco. He reports that he does not drink alcohol or use drugs.  No Known Allergies  Family History  Problem Relation Age of Onset  . Heart failure Mother   . Coronary artery disease Father   . Stroke Father   . Alcoholism Father   . Heart disease Brother     MI     Prior to Admission medications   Medication Sig Start Date End Date Taking? Authorizing Provider  albuterol (PROVENTIL HFA;VENTOLIN HFA) 108 (90 Base) MCG/ACT inhaler Inhale 1 puff into the lungs every 6 (six) hours as needed for wheezing or shortness of breath.   Yes Historical Provider, MD  CISPLATIN IV Inject into the vein.   Yes Historical Provider, MD  dexamethasone (DECADRON) 4 MG tablet Take 2 tablets by mouth once a day on the day after chemotherapy and then take 2 tablets two times a day for 2 days. Take with food. 06/27/16  Yes Patrici Ranks, MD  fentaNYL (DURAGESIC - DOSED MCG/HR) 25 MCG/HR patch Place 1 patch (25 mcg total) onto the skin every 3 (three) days. 08/12/16  Yes Patrici Ranks, MD  HYDROcodone-acetaminophen (HYCET) 7.5-325 mg/15 ml solution Take 15 mLs by mouth every 4 (four) hours as needed for moderate pain. 08/02/16  Yes Baird Cancer, PA-C  HYDROcodone-acetaminophen (NORCO) 10-325 MG tablet Take 1 tablet by mouth every 4 (four) hours as needed for moderate pain. 08/03/16  Yes Manon Hilding Kefalas, PA-C  LORazepam (ATIVAN) 1 MG tablet Take 1 mg by mouth at bedtime.    Yes Historical Provider, MD  magic mouthwash w/lidocaine SOLN Swish and swallow 64ml four times a day for mouth pain/soreness. Patient taking differently: Take 10 mLs  by mouth 4 (four) times daily as needed for mouth pain. Swish and swallow 42ml four times a day for mouth pain/soreness. 07/12/16  Yes Patrici Ranks, MD  ondansetron (ZOFRAN) 8 MG tablet Take 1 tablet (8 mg total) by mouth every 8 (eight) hours as needed for nausea or vomiting. 06/23/16  Yes Patrici Ranks, MD  pantoprazole (PROTONIX) 40 MG tablet Take 40 mg by mouth 2 (two) times daily as needed.  02/01/16  Yes Historical Provider, MD  prochlorperazine (COMPAZINE) 10 MG tablet Take 1 tablet (10 mg total) by mouth every 6 (six) hours as needed for nausea or vomiting. 06/23/16  Yes Patrici Ranks, MD  atorvastatin (LIPITOR) 10 MG tablet Take 10 mg by mouth daily. 12/18/15   Historical Provider, MD  Choline Fenofibrate (TRILIPIX) 135 MG capsule Take 135 mg by mouth daily.     Historical Provider, MD  FLUCELVAX QUADRIVALENT SUSP  06/10/16   Historical Provider, MD  lidocaine-prilocaine (EMLA) cream  Apply 1 application topically as needed. Patient not taking: Reported on 08/21/2016 06/23/16   Patrici Ranks, MD  traMADol (ULTRAM) 50 MG tablet Take 1 tablet (50 mg total) by mouth every 6 (six) hours as needed for moderate pain. Patient not taking: Reported on 08/21/2016 06/27/16   Aviva Signs, MD    Physical Exam: Vitals:   08/21/16 1839  BP: (!) 117/54  Pulse: 84  Resp: 20  Temp: 98.7 F (37.1 C)  TempSrc: Temporal  SpO2: 99%  Weight: 70.3 kg (155 lb)  Height: 5\' 10"  (1.778 m)      Constitutional: NAD, calm, appears uncomfortable Eyes: PERTLA, lids and conjunctivae normal ENMT: Mucous membranes are dry. Posterior pharynx clear of any exudate or lesions.   Neck: normal, supple, no masses, no thyromegaly Respiratory: No rhonchi or wheeze, crackles at right base. Normal respiratory effort.  Cardiovascular: S1 & S2 heard, regular rate and rhythm. Bilateral pedal edema. No significant JVD. Abdomen: No distension, no tenderness, no masses palpated. Bowel sounds normal. Enteric tube site  c/d/i. Musculoskeletal: no clubbing / cyanosis. No joint deformity upper and lower extremities. Normal muscle tone.  Skin: no significant rashes, lesions, ulcers. Warm, dry, well-perfused. Neurologic: CN 2-12 grossly intact. Sensation intact, DTR normal. Strength 5/5 in all 4 limbs.  Psychiatric: Normal judgment and insight. Alert and oriented x 3. Normal mood and affect.     Labs on Admission: I have personally reviewed following labs and imaging studies  CBC:  Recent Labs Lab 08/21/16 1932  WBC 7.4  HGB 10.4*  HCT 31.3*  MCV 86.0  PLT [id number]*   Basic Metabolic Panel:  Recent Labs Lab 08/21/16 1932  NA 132*  K 2.5*  CL 88*  CO2 34*  GLUCOSE 181*  BUN 58*  CREATININE 2.46*  CALCIUM 8.3*  MG 1.4*   GFR: Estimated Creatinine Clearance: 27.8 mL/min (by C-G formula based on SCr of 2.46 mg/dL (H)). Liver Function Tests:  Recent Labs Lab 08/21/16 1932  AST 22  ALT 37  ALKPHOS 64  BILITOT 0.6  PROT 5.6*  ALBUMIN 2.6*   No results for input(s): LIPASE, AMYLASE in the last 168 hours. No results for input(s): AMMONIA in the last 168 hours. Coagulation Profile: No results for input(s): INR, PROTIME in the last 168 hours. Cardiac Enzymes: No results for input(s): CKTOTAL, CKMB, CKMBINDEX, TROPONINI in the last 168 hours. BNP (last 3 results) No results for input(s): PROBNP in the last 8760 hours. HbA1C: No results for input(s): HGBA1C in the last 72 hours. CBG: No results for input(s): GLUCAP in the last 168 hours. Lipid Profile: No results for input(s): CHOL, HDL, LDLCALC, TRIG, CHOLHDL, LDLDIRECT in the last 72 hours. Thyroid Function Tests: No results for input(s): TSH, T4TOTAL, FREET4, T3FREE, THYROIDAB in the last 72 hours. Anemia Panel: No results for input(s): VITAMINB12, FOLATE, FERRITIN, TIBC, IRON, RETICCTPCT in the last 72 hours. Urine analysis:    Component Value Date/Time   COLORURINE YELLOW 09/03/2007 [place] 09/03/2007 0942    LABSPEC 1.022 09/03/2007 0942   PHURINE 6.5 09/03/2007 0942   GLUCOSEU NEGATIVE 09/03/2007 0942   HGBUR NEGATIVE 09/03/2007 0942   BILIRUBINUR NEGATIVE 09/03/2007 0942   KETONESUR NEGATIVE 09/03/2007 0942   PROTEINUR NEGATIVE 09/03/2007 0942   UROBILINOGEN 1.0 09/03/2007 0942   NITRITE NEGATIVE 09/03/2007 0942   LEUKOCYTESUR  09/03/2007 0942    NEGATIVE MICROSCOPIC NOT DONE ON URINES WITH NEGATIVE PROTEIN, BLOOD, LEUKOCYTES, NITRITE, OR GLUCOSE <1000 mg/dL.   Sepsis Labs: @LABRCNTIP (procalcitonin:4,lacticidven:4) )  No results found for this or any previous visit (from the past 240 hour(s)).   Radiological Exams on Admission: Dg Chest 2 View  Result Date: 08/21/2016 CLINICAL DATA:  Chest pain and weakness. Generalized weakness beginning 3 days ago. Productive cough with clear sputum and sore throat. Personal history of laryngeal cancer. EXAM: CHEST  2 VIEW COMPARISON:  One-view chest x-ray 06/27/2016 FINDINGS: Last leads is exaggerated by low lung volumes. Atherosclerotic calcifications are evident along the aorta. A left subclavian Port-A-Cath is accessed. Diffuse interstitial coarsening is again noted. Mild pulmonary vascular congestion is present. IMPRESSION: 1. Borderline cardiomegaly with pulmonary vascular congestion and probable mild edema. 2. Aortic atherosclerosis. 3. No focal airspace consolidation. 4. Left subclavian Port-A-Cath is accessed. Electronically Signed   By: San Morelle M.D.   On: 08/21/2016 20:13    EKG: Independently reviewed. Sinus rhythm, septal Q-wave  Assessment/Plan  1. Acute kidney injury  - SCr is 2.46 on admission, up from apparent baseline of 0.8  - Pt appears to be intravascularly depleted and was treated with NS bolus in ED  - Will continue IVF hydration overnight while avoiding nephrotoxins and being mindful of developing pulmonary edema  - Suspect this is a prerenal azotemia in setting of intravascular volume depletion - Check renal US and  urine studies  - Repeat chem panel in am    2. Hypokalemia  - Serum potassium is 2.5 on admission with magnesium level pending; no EKG changes  - Pt was given 40 mEq oral potassium in ED and 40 mEq IV potassium on admission; 2 g IV magnesium given  - Monitor on telemetry and repeat chem panel with mag level in am    3. Edema  - There is new development of bilateral pedal edema and early interstitial edema on CXR  - LFTs added-on to admission labs for albumin level; TTE ordered for ?CHF  - He is intravascularly depleted and receiving a gentle IVF hydration  - Follow strict I/O's and daily wts    4. Laryngeal cancer  - Just completed 3rd cycle of cisplatin and radiation  - Ongoing management per oncology    5. COPD - No dyspnea or wheezing on admission  - Continue prn albuterol    6. Constipation  - Likely secondary to opiate analgesics  - Bowel regimen ordered, will escalate as needed  7. Normocytic anemia, thrombocytopenia  - Hgb is 10.4 on admission, down from 11.1 a week prior, likely secondary to chemotherapy, no bleeding - Platelets 124,000 on admission with no bleeding, likely secondary to chemotherapy   8. GERD - No EGD report on file; has documented hx of PUD  - Continue BID Protonix     DVT prophylaxis: sq heparin  Code Status: Full  Family Communication: Daughters updated at bedside at patient's request  Disposition Plan:  Consults called: None Admission status: Observation    Vianne Bulls, MD [facility] Pager [phone]  If 7PM-7AM, please contact night-coverage [email or web address] Password [email or web address]  08/21/2016, 9:10 PM

## 2016-08-21 NOTE — ED Notes (Signed)
CRITICAL VALUE ALERT  Critical value received:  Potassium 2.5  Date of notification:  08/21/16  Time of notification:  2026  Critical value read back:Yes.    Nurse who received alert:  Derek Mound, RN  MD notified (1st page):  Gilford Raid  Time of first page:  2026  MD notified (2nd page):  Time of second page:  Responding MD:  Gilford Raid  Time MD responded:  2026

## 2016-08-21 NOTE — ED Provider Notes (Signed)
[facility] DEPT Provider Note   CSN: [id number] Arrival date & time: 08/21/16  1828     History   Chief Complaint Chief Complaint  Patient presents with  . Fatigue    HPI Bruce [name] is a 70 y.o. male.  Pt present to the ED today with generalized weakness.  He has esophageal cancer and last received chemo on 11/17.  His last radiation treatment was 11/22.  He is followed by Dr. Whitney Muse.  The pt is fed via feeding tube.  His family said he has been tolerating those feedings. He has not had n/v. He was 173 pounds in May and is now 156 pounds.  The pt's daughters said he has been very weak and has been unable to get up.  They said they are concerned that he is dehydrated.      Past Medical History:  Diagnosis Date  . AAA (abdominal aortic aneurysm) ([place])   . Anxiety   . Cancer (HCC)    laryngeal  . 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 Carpal tunnel syndrome   . Cellulitis   . COPD (chronic obstructive pulmonary disease) ([place])   . Epigastric hernia   . GERD (gastroesophageal reflux disease)   . Hyperlipidemia   . Hypertension     used to take amlodipine, none now    Patient Active Problem List   Diagnosis Date Noted  . Hypokalemia 08/21/2016  . 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 COPD (chronic obstructive pulmonary disease) ([place]) 06/09/2013  . Tobacco abuse 09/29/2011  . HTN (hypertension) 09/29/2011    Past Surgical History:  Procedure Laterality Date  . APPENDECTOMY    . DIRECT LARYNGOSCOPY N/A 05/16/2016   Procedure: MICRO DIRECT LARYNGOSCOPY WITH BIOPSY;  Surgeon: Leta Baptist, MD;  Location: [place];  Service: ENT;  Laterality: N/A;  . ESOPHAGOGASTRODUODENOSCOPY N/A 06/27/2016   Procedure: ESOPHAGOGASTRODUODENOSCOPY (EGD);  Surgeon: Aviva Signs, MD;  Location[facility];  Service: General;  Laterality: N/A;  . HEMORROIDECTOMY    . IR  GENERIC HISTORICAL  07/14/2016   IR RADIOLOGIST EVAL & MGMT [facility]  . IR GENERIC HISTORICAL  07/14/2016   IR PATIENT EVAL TECH 0-60 MINS [facility]  . PEG PLACEMENT N/A 06/27/2016   Procedure: PERCUTANEOUS ENDOSCOPIC GASTROSTOMY (PEG) PLACEMENT;  Surgeon: Aviva Signs, MD;  Location: [facility];  Service: General;  Laterality: N/A;  . PORTACATH PLACEMENT Left 06/27/2016   Procedure: INSERTION PORT-A-CATH;  Surgeon: Aviva Signs, MD;  Location: [facility];  Service: General;  Laterality: Left;  Marland Kitchen VASCULAR SURGERY Right 2008   stent to right common iliac artery       Home Medications    Prior to Admission medications   Medication Sig Start Date End Date Taking? Authorizing Provider  albuterol (PROVENTIL HFA;VENTOLIN HFA) 108 (90 Base) MCG/ACT inhaler Inhale 1 puff into the lungs every 6 (six) hours as needed for wheezing or shortness of breath.   Yes Historical Provider, MD  CISPLATIN IV Inject into the vein.   Yes Historical Provider, MD  dexamethasone (DECADRON) 4 MG tablet Take 2 tablets by mouth once a day on the day after chemotherapy and then take 2 tablets two times a day for 2 days. Take with food. 06/27/16  Yes Patrici Ranks, MD  fentaNYL (DURAGESIC - DOSED MCG/HR) 25 MCG/HR patch Place 1 patch (25 mcg total) onto the skin every 3 (three) days. 08/12/16  Yes Larene Beach  Elio Forget, MD  HYDROcodone-acetaminophen (HYCET) 7.5-325 mg/15 ml solution Take 15 mLs by mouth every 4 (four) hours as needed for moderate pain. 08/02/16  Yes Baird Cancer, PA-C  HYDROcodone-acetaminophen (NORCO) 10-325 MG tablet Take 1 tablet by mouth every 4 (four) hours as needed for moderate pain. 08/03/16  Yes Manon Hilding Kefalas, PA-C  LORazepam (ATIVAN) 1 MG tablet Take 1 mg by mouth at bedtime.    Yes Historical Provider, MD  magic mouthwash w/lidocaine SOLN Swish and swallow 46ml four times a day for mouth pain/soreness. Patient taking differently: Take 10 mLs by mouth 4 (four) times daily as needed for mouth  pain. Swish and swallow 68ml four times a day for mouth pain/soreness. 07/12/16  Yes Patrici Ranks, MD  ondansetron (ZOFRAN) 8 MG tablet Take 1 tablet (8 mg total) by mouth every 8 (eight) hours as needed for nausea or vomiting. 06/23/16  Yes Patrici Ranks, MD  pantoprazole (PROTONIX) 40 MG tablet Take 40 mg by mouth 2 (two) times daily as needed.  02/01/16  Yes Historical Provider, MD  prochlorperazine (COMPAZINE) 10 MG tablet Take 1 tablet (10 mg total) by mouth every 6 (six) hours as needed for nausea or vomiting. 06/23/16  Yes Patrici Ranks, MD  atorvastatin (LIPITOR) 10 MG tablet Take 10 mg by mouth daily. 12/18/15   Historical Provider, MD  Choline Fenofibrate (TRILIPIX) 135 MG capsule Take 135 mg by mouth daily.     Historical Provider, MD  FLUCELVAX QUADRIVALENT SUSP  06/10/16   Historical Provider, MD  lidocaine-prilocaine (EMLA) cream Apply 1 application topically as needed. Patient not taking: Reported on 08/21/2016 06/23/16   Patrici Ranks, MD  traMADol (ULTRAM) 50 MG tablet Take 1 tablet (50 mg total) by mouth every 6 (six) hours as needed for moderate pain. Patient not taking: Reported on 08/21/2016 06/27/16   Aviva Signs, MD    Family History Family History  Problem Relation Age of Onset  . Heart failure Mother   . Coronary artery disease Father   . Stroke Father   . Alcoholism Father   . Heart disease Brother     MI    Social History Social History  Substance Use Topics  . Smoking status: Current Every Day Smoker    Packs/day: 0.00    Years: 12.00    Types: Cigars  . Smokeless tobacco: Never Used     Comment: 5-7 cigars daily  . Alcohol use No     Allergies   Patient has no known allergies.   Review of Systems Review of Systems  Constitutional: Positive for fatigue.  All other systems reviewed and are negative.    Physical Exam Updated Vital Signs BP (!) 117/54 (BP Location: Left Arm)   Pulse 84   Temp 98.7 F (37.1 C) (Temporal)   Resp 20    Ht 5\' 10"  (1.778 m)   Wt 155 lb (70.3 kg)   SpO2 99%   BMI 22.24 kg/m   Physical Exam  Constitutional: He appears well-developed.  HENT:  Head: Normocephalic and atraumatic.  Right Ear: External ear normal.  Left Ear: External ear normal.  Nose: Nose normal.  Mouth/Throat: Oropharynx is clear and moist.  Eyes: Conjunctivae are normal. Pupils are equal, round, and reactive to light.  Neck: Normal range of motion. Neck supple.  Cardiovascular: Normal rate, regular rhythm, normal heart sounds and intact distal pulses.   Pulmonary/Chest: Effort normal and breath sounds normal.  Abdominal: Soft. Bowel sounds are normal.  Feeding tube in place w/o complications  Musculoskeletal: Normal range of motion.  Neurological: He is alert.  Skin: Skin is warm.  Psychiatric: He has a normal mood and affect. His behavior is normal. Judgment and thought content normal.  Nursing note and vitals reviewed.    ED Treatments / Results  Labs (all labs ordered are listed, but only abnormal results are displayed) Labs Reviewed  BASIC METABOLIC PANEL - Abnormal; Notable for the following:       Result Value   Sodium 132 (*)    Potassium 2.5 (*)    Chloride 88 (*)    CO2 34 (*)    Glucose, Bld 181 (*)    BUN 58 (*)    Creatinine, Ser 2.46 (*)    Calcium 8.3 (*)    GFR calc non Af Amer 25 (*)    GFR calc Af Amer 29 (*)    All other components within normal limits  CBC - Abnormal; Notable for the following:    RBC 3.64 (*)    Hemoglobin 10.4 (*)    HCT 31.3 (*)    Platelets 124 (*)    All other components within normal limits  HEPATIC FUNCTION PANEL  MAGNESIUM  I-STAT TROPOININ, ED    EKG  EKG Interpretation None       Radiology Dg Chest 2 View  Result Date: 08/21/2016 CLINICAL DATA:  Chest pain and weakness. Generalized weakness beginning 3 days ago. Productive cough with clear sputum and sore throat. Personal history of laryngeal cancer. EXAM: CHEST  2 VIEW COMPARISON:   One-view chest x-ray 06/27/2016 FINDINGS: Last leads is exaggerated by low lung volumes. Atherosclerotic calcifications are evident along the aorta. A left subclavian Port-A-Cath is accessed. Diffuse interstitial coarsening is again noted. Mild pulmonary vascular congestion is present. IMPRESSION: 1. Borderline cardiomegaly with pulmonary vascular congestion and probable mild edema. 2. Aortic atherosclerosis. 3. No focal airspace consolidation. 4. Left subclavian Port-A-Cath is accessed. Electronically Signed   By: San Morelle M.D.   On: 08/21/2016 20:13    Procedures Procedures (including critical care time)  Medications Ordered in ED Medications  sodium chloride 0.9 % bolus 1,000 mL (not administered)  potassium chloride 20 MEQ/15ML (10%) solution 40 mEq (not administered)  morphine 4 MG/ML injection 4 mg (not administered)  sodium chloride 0.9 % bolus 1,000 mL (1,000 mLs Intravenous New Bag/Given 08/21/16 1945)     Initial Impression / Assessment and Plan / ED Course  I have reviewed the triage vital signs and the nursing notes.  Pertinent labs & imaging results that were available during my care of the patient were reviewed by me and considered in my medical decision making (see chart for details).  Clinical Course     Pt given IVFs in the ED.  Potassium was given.  Mg lab ordered and is pending.  Pt d/w hospitalist (Dr. Myna Hidalgo) for admission.  Final Clinical Impressions(s) / ED Diagnoses   Final diagnoses:  Hypokalemia  Acute renal failure, unspecified acute renal failure type ([place])  Laryngeal carcinoma (HCC)  Dehydration    New Prescriptions New Prescriptions   No medications on file     Isla Pence, MD 08/21/16 2043

## 2016-08-22 ENCOUNTER — Encounter (HOSPITAL_COMMUNITY): Admission: RE | Admit: 2016-08-22 | Payer: Medicare Other | Source: Ambulatory Visit

## 2016-08-22 ENCOUNTER — Observation Stay (HOSPITAL_COMMUNITY): Payer: Medicare Other

## 2016-08-22 ENCOUNTER — Observation Stay (HOSPITAL_BASED_OUTPATIENT_CLINIC_OR_DEPARTMENT_OTHER): Payer: Medicare Other

## 2016-08-22 DIAGNOSIS — N179 Acute kidney failure, unspecified: Secondary | ICD-10-CM | POA: Diagnosis not present

## 2016-08-22 DIAGNOSIS — C321 Malignant neoplasm of supraglottis: Secondary | ICD-10-CM | POA: Diagnosis not present

## 2016-08-22 DIAGNOSIS — F419 Anxiety disorder, unspecified: Secondary | ICD-10-CM

## 2016-08-22 DIAGNOSIS — I509 Heart failure, unspecified: Secondary | ICD-10-CM | POA: Diagnosis not present

## 2016-08-22 DIAGNOSIS — K5900 Constipation, unspecified: Secondary | ICD-10-CM | POA: Diagnosis not present

## 2016-08-22 LAB — HEPATIC FUNCTION PANEL
ALBUMIN: 2.3 g/dL — AB (ref 3.5–5.0)
ALT: 31 U/L (ref 17–63)
AST: 19 U/L (ref 15–41)
Alkaline Phosphatase: 57 U/L (ref 38–126)
BILIRUBIN TOTAL: 0.4 mg/dL (ref 0.3–1.2)
Bilirubin, Direct: 0.1 mg/dL (ref 0.1–0.5)
Indirect Bilirubin: 0.3 mg/dL (ref 0.3–0.9)
TOTAL PROTEIN: 5.2 g/dL — AB (ref 6.5–8.1)

## 2016-08-22 LAB — BASIC METABOLIC PANEL
Anion gap: 7 (ref 5–15)
BUN: 51 mg/dL — AB (ref 6–20)
CHLORIDE: 95 mmol/L — AB (ref 101–111)
CO2: 33 mmol/L — AB (ref 22–32)
CREATININE: 2.15 mg/dL — AB (ref 0.61–1.24)
Calcium: 8 mg/dL — ABNORMAL LOW (ref 8.9–10.3)
GFR calc Af Amer: 34 mL/min — ABNORMAL LOW (ref >60)
GFR calc non Af Amer: 29 mL/min — ABNORMAL LOW (ref >60)
Glucose, Bld: 177 mg/dL — ABNORMAL HIGH (ref 65–99)
Potassium: 3.2 mmol/L — ABNORMAL LOW (ref 3.5–5.1)
SODIUM: 135 mmol/L (ref 135–145)

## 2016-08-22 LAB — GLUCOSE, CAPILLARY
GLUCOSE-CAPILLARY: 215 mg/dL — AB (ref 65–99)
Glucose-Capillary: 101 mg/dL — ABNORMAL HIGH (ref 65–99)

## 2016-08-22 LAB — ECHOCARDIOGRAM COMPLETE
Height: 70 in
Weight: 2468.8 oz

## 2016-08-22 LAB — MAGNESIUM: Magnesium: 1.8 mg/dL (ref 1.7–2.4)

## 2016-08-22 MED ORDER — SENNOSIDES-DOCUSATE SODIUM 8.6-50 MG PO TABS
1.0000 | ORAL_TABLET | Freq: Two times a day (BID) | ORAL | Status: DC
  Administered 2016-08-22 – 2016-08-26 (×9): 1 via ORAL
  Filled 2016-08-22 (×10): qty 1

## 2016-08-22 MED ORDER — POTASSIUM CHLORIDE CRYS ER 20 MEQ PO TBCR
40.0000 meq | EXTENDED_RELEASE_TABLET | Freq: Once | ORAL | Status: AC
  Administered 2016-08-22: 40 meq via ORAL
  Filled 2016-08-22: qty 2

## 2016-08-22 MED ORDER — POLYETHYLENE GLYCOL 3350 17 G PO PACK
17.0000 g | PACK | Freq: Every day | ORAL | Status: DC
  Administered 2016-08-22 – 2016-08-26 (×5): 17 g via ORAL
  Filled 2016-08-22 (×4): qty 1

## 2016-08-22 NOTE — Progress Notes (Signed)
[facility]                                                                              Patient Demographics  Bruce [name], is a 70 y.o. male, DOB - 02-08-1946, [id number]  Admit date - 08/21/2016   Admitting Physician Vianne Bulls, MD  Outpatient Primary MD for the patient is [name], MD  Outpatient specialists:   LOS - 0  days    Chief Complaint  Patient presents with  . 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 Patient underwent his third cycle of cisplatin with radiation to the head and neck in the past week and has since been progressively weak and with a general sense of malaise. He reports not feeling as poorly after the first 2 cycles of treatment, but notes that he was able to continue oral intake in those instances, but has now been dependent on a G-tube for the past 3 weeks or so. He reported 10 pound weight loss in the last month. Over the past week, the patient is also noted swelling in the bilateral feet and has a slight cough productive of thick clear sputum. Patient denied abdominal pain, nausea, or vomiting. He complained of severe constipation which has not improved despite treatment with mag citrate at home.  Patient was found to have sodium 132, potassium 2.5, creatinine 2.46. Magnesium 1.4. Creatinine was 0.78 on 11/17 he was admitted for further workup.  Assessment & Plan     Acute kidney injury  -Improving  - Cr is 2.46 on admission, up from apparent baseline of 0.8  - Suspect this is a prerenal azotemia in setting of intravascular volume depletion - renal US showed no obstruction or hydronephrosis   Hypokalemia, hypomagnesemia - Replaced  Edema hypoalbuminemia,   - There is new development of bilateral pedal edema and early  interstitial edema on CXR  -  albumin 2.3, possibly third spacing  - Follow 2-D echocardiogram to assess underlying CHF or cardiotoxicity from chemotherapy  - Nutrition consult, has G-tube   Laryngeal cancer  - Just completed 3rd cycle of cisplatin and radiation  - Ongoing management per oncology     COPD - No dyspnea or wheezing on admission  - Continue prn albuterol     Constipation  - Likely secondary to opiate analgesics  -  placed on daily Senokot-S and MiraLAX   Normocytic anemia, thrombocytopenia  - Hgb is 10.4 on admission, down from 11.1 a week prior, likely secondary to chemotherapy, no bleeding - Platelets 124,000 on admission with no bleeding, likely secondary to chemotherapy   GERD - No EGD report on file; has documented hx of PUD  - Continue BID Protonix    Code Status: full  DVT Prophylaxis:  heparin  Family Communication: Discussed in detail with the patient, all imaging results, lab results explained to the patient   Disposition Plan: PT evaluation, hopefully next 24-48 hours  Time Spent in minutes  25 minutes  Procedures:    Consultants:   None  Antimicrobials      Medications  Scheduled Meds: . feeding supplement (ENSURE ENLIVE)  237 mL Oral BID BM  . feeding supplement (OSMOLITE 1.5 CAL)  273 mL Per Tube QHS  . feeding supplement (OSMOLITE 1.5 CAL)  474 mL Per Tube TID WC  . fentaNYL  25 mcg Transdermal Q72H  . heparin  5,000 Units Subcutaneous Q8H  . pantoprazole sodium  40 mg Per Tube BID  . sodium chloride flush  3 mL Intravenous Q12H   Continuous Infusions: . sodium chloride     PRN Meds:.albuterol, bisacodyl, HYDROcodone-acetaminophen, magic mouthwash **AND** lidocaine, LORazepam, morphine injection, polyethylene glycol, prochlorperazine, zolpidem   Antibiotics   Anti-infectives    None        Subjective:   Bruce [name] was seen and examined today. Just feeling weak otherwise  Patient denies dizziness, chest  pain, shortness of breath, abdominal pain, N/V/D/C, numbess, tingling. No acute events overnight.    Objective:   Vitals:   08/21/16 2103 08/21/16 2212 08/21/16 2352 08/22/16 0453  BP: 121/78 136/83  113/79  Pulse:  83  74  Resp: 19 18  18   Temp:  97.9 F (36.6 C)  98.6 F (37 C)  TempSrc:  Oral  Oral  SpO2:  95% 95% 96%  Weight:  68.6 kg (151 lb 3.2 oz)  70 kg (154 lb 4.8 oz)  Height:  5\' 10"  (1.778 m)      Intake/Output Summary (Last 24 hours) at 08/22/16 1229 Last data filed at 08/22/16 0223  Gross per 24 hour  Intake             1400 ml  Output              450 ml  Net              950 ml     Wt Readings from Last 3 Encounters:  08/22/16 70 kg (154 lb 4.8 oz)  08/12/16 70.8 kg (156 lb)  08/03/16 71.2 kg (156 lb 14.4 oz)     Exam  General: Alert and oriented x 3, NAD, Sometimes difficult to comprehend due to laryngeal cancer   HEENT:  PERRLA, EOMI, Anicteric Sclera, mucous membranes moist.   Neck: Supple, no JVD, no masses  Cardiovascular: S1 S2 auscultated, no rubs, murmurs or gallops. Regular rate and rhythm.  Respiratory: Clear to auscultation bilaterally, no wheezing, rales or rhonchi  Gastrointestinal: Soft, nontender, nondistended, + bowel sounds, G tube   Ext: no cyanosis clubbing, 1+ edema  Neuro: AAOx3, Cr N's II- XII. Strength 5/5 upper and lower extremities bilaterally  Skin: No rashes  Psych: Normal affect and demeanor, alert and oriented x3    Data Reviewed:  I have personally reviewed following labs and imaging studies  Micro Results No results found for this or any previous visit (from the past 240 hour(s)).  Radiology Reports Dg Chest 2 View  Result Date: 08/21/2016 CLINICAL DATA:  Chest pain and weakness. Generalized weakness beginning 3 days ago. Productive cough with clear sputum and sore throat. Personal history of laryngeal cancer. EXAM: CHEST  2 VIEW COMPARISON:  One-view chest x-ray 06/27/2016 FINDINGS: Last leads is  exaggerated by low lung volumes. Atherosclerotic calcifications are evident along the aorta. A left subclavian Port-A-Cath is accessed. Diffuse interstitial coarsening is again noted. Mild pulmonary vascular congestion is present. IMPRESSION: 1. Borderline cardiomegaly with pulmonary vascular congestion and  probable mild edema. 2. Aortic atherosclerosis. 3. No focal airspace consolidation. 4. Left subclavian Port-A-Cath is accessed. Electronically Signed   By: San Morelle M.D.   On: 08/21/2016 20:13   US Renal  Addendum Date: 08/22/2016   ADDENDUM REPORT: 08/22/2016 12:00 ADDENDUM: Repeat ultrasound was performed at the inferior pole of the LEFT kidney. Under real-time imaging and on obtained images, no evidence of LEFT inferior pole exophytic lesion is identified. The questioned abnormality on the earlier images corresponding to lobulated inferior LEFT renal parenchyma. No LEFT renal mass identified. Electronically Signed   By: Lavonia Dana M.D.   On: 08/22/2016 12:00   Result Date: 08/22/2016 CLINICAL DATA:  70 year old male with acute renal failure. Initial encounter. EXAM: RENAL / URINARY TRACT ULTRASOUND COMPLETE COMPARISON:  06/14/2016 PET CT.  03/02/2016 CT abdomen and pelvis. FINDINGS: Right Kidney: Length: 12.6 cm.  No hydronephrosis.  2.3 cm cyst. Left Kidney: Length: 12.9 cm. No hydronephrosis. Sonographer raises possibility of inferior medial 1.8 cm structure which cannot be confirmed as a true lesion or a cyst. No FDG avid mass was noted in this region on recent PET-CT. Patient will be recall for radiologist (Dr. Thornton Papas) directed ultrasound to determine if this represents an artifact versus mass. Bladder: Appears normal for degree of bladder distention. IMPRESSION: No hydronephrosis. 22.3 cm right renal cyst. Sonographer raises possibility of inferior medial 1.8 cm structure which cannot be confirmed as a true lesion or a cyst. No FDG avid mass was noted in this region on recent PET-CT.  Patient will be recall for radiologist (Dr. Thornton Papas) directed ultrasound to determine if this represents an artifact versus mass. Electronically Signed: By: Genia Del M.D. On: 08/22/2016 09:11    Lab Data:  CBC:  Recent Labs Lab 08/21/16 1932  WBC 7.4  HGB 10.4*  HCT 31.3*  MCV 86.0  PLT [id number]*   Basic Metabolic Panel:  Recent Labs Lab 08/21/16 1932 08/22/16 0445  NA 132* 135  K 2.5* 3.2*  CL 88* 95*  CO2 34* 33*  GLUCOSE 181* 177*  BUN 58* 51*  CREATININE 2.46* 2.15*  CALCIUM 8.3* 8.0*  MG 1.4* 1.8   GFR: Estimated Creatinine Clearance: 31.7 mL/min (by C-G formula based on SCr of 2.15 mg/dL (H)). Liver Function Tests:  Recent Labs Lab 08/21/16 1932 08/22/16 0445  AST 22 19  ALT 37 31  ALKPHOS 64 57  BILITOT 0.6 0.4  PROT 5.6* 5.2*  ALBUMIN 2.6* 2.3*   No results for input(s): LIPASE, AMYLASE in the last 168 hours. No results for input(s): AMMONIA in the last 168 hours. Coagulation Profile: No results for input(s): INR, PROTIME in the last 168 hours. Cardiac Enzymes: No results for input(s): CKTOTAL, CKMB, CKMBINDEX, TROPONINI in the last 168 hours. BNP (last 3 results) No results for input(s): PROBNP in the last 8760 hours. HbA1C: No results for input(s): HGBA1C in the last 72 hours. CBG:  Recent Labs Lab 08/22/16 0849  GLUCAP 101*   Lipid Profile: No results for input(s): CHOL, HDL, LDLCALC, TRIG, CHOLHDL, LDLDIRECT in the last 72 hours. Thyroid Function Tests: No results for input(s): TSH, T4TOTAL, FREET4, T3FREE, THYROIDAB in the last 72 hours. Anemia Panel: No results for input(s): VITAMINB12, FOLATE, FERRITIN, TIBC, IRON, RETICCTPCT in the last 72 hours. Urine analysis:    Component Value Date/Time   COLORURINE YELLOW 09/03/2007 [place] 09/03/2007 0942   LABSPEC 1.022 09/03/2007 0942   PHURINE 6.5 09/03/2007 [place] 09/03/2007 [id number]  HGBUR NEGATIVE 09/03/2007 [place] 09/03/2007  [place] 09/03/2007 0942   PROTEINUR NEGATIVE 09/03/2007 0942   UROBILINOGEN 1.0 09/03/2007 0942   NITRITE NEGATIVE 09/03/2007 0942   LEUKOCYTESUR  09/03/2007 0942    NEGATIVE MICROSCOPIC NOT DONE ON URINES WITH NEGATIVE PROTEIN, BLOOD, LEUKOCYTES, NITRITE, OR GLUCOSE <1000 mg/dL.     RAI,RIPUDEEP M.D. [facility] 08/22/2016, 12:29 PM  Pager: [phone] Between 7am to 7pm - call Pager - [phone]  After 7pm go to [email or web address] - password [email or web address]  Call night coverage person covering after 7pm

## 2016-08-22 NOTE — Progress Notes (Addendum)
Initial Nutrition Assessment  DOCUMENTATION CODES:   Severe malnutrition in context of chronic illness  INTERVENTION:  Continue Bolus feedings 4 times daily.   -Osmolite 1.5 @ 474 ml TID (0800, 1200,1600) and 237 ml bolus at 2000 via PEG   -Flushes 120 ml water before and after each bolus   Tube feeding regimen provides 2485 kcal,  116.9 grams of protein, and 1267 ml of H2O. Total water =2227 ml/day.   NUTRITION DIAGNOSIS:   Increased nutrient needs related to cancer and cancer related treatments as evidenced by estimated needs.  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 He has been losing weight and unable to maintain adequate oral nutrition intake. A PEG tube placed  On 06/27/16. Patient has been non- compliant with tube feeding recommendations according to what he and his daughters are saying. His po intake has continued to be minimal. He has severe weight loss 9% in 37 days. His daughter is here and affirms pt taking average of 4 cans daily once up to 5 cans. She has been coming by to assist him with administering the formula and flushes. He is not taking anything by mouth at this time.  He is complaining that his bowels haven't moved in a week. He received an enema last night but not much result per pt. He has been placed on a bowel regimen and we talked about whether he would be willing to try adding prune juice daily.   His lunch is here but nothing has been opened. He doesn't want anything.  Discussed pt with his nurse today who says he tolerated his enteral feeding of 474 ml this morning.  Patient needs reassessed following his severe weight loss. His needs decreased slightly but tube feeding  regimen continued at current rate to promote weight stability and repletion. requesting nursing to assist with education/ problem soliving and encouragement with compliance of enteral administration.  Recent Labs Lab 08/21/16 1932 08/22/16 0445  NA 132* 135  K 2.5* 3.2*  CL 88* 95*  CO2 34* 33*  BUN 58* 51*  CREATININE 2.46* 2.15*  CALCIUM 8.3* 8.0*  MG 1.4* 1.8  GLUCOSE 181* 177*   Labs: BUN 51, Cr 2.15, Glucose 177, potassium 3.2  Meds: Miralax, senna  Diet Order:  Diet full liquid Room service appropriate? Yes; Fluid consistency: Thin  Skin:  Reviewed, no issues  Last BM:  11/26 enema with little results per pt  Height:   Ht Readings from Last 1 Encounters:  08/21/16 5\' 10"  (1.778 m)    Weight:   Wt Readings from Last 1 Encounters:  08/22/16 154 lb 4.8 oz (70 kg)    Ideal Body Weight:  75 kg  BMI:  Body mass index is 22.14 kg/m.  Estimated Nutritional Needs:   Kcal:  2100-2310  Protein:  98-112 gr  Fluid:  2.1-2.3 liters daily  EDUCATION NEEDS:   Education needs addressed  Colman Cater MS,RD,CSG,LDN Office: [phone] Pager: [phone]

## 2016-08-22 NOTE — Evaluation (Signed)
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 Patient underwent his third cycle of cisplatin with radiation to the head and neck in the past week and has since been progressively weak and with a general sense of malaise. He reports not feeling as poorly after the first 2 cycles of treatment, but notes that he was able to continue oral intake in those instances, but has now been dependent on a G-tube for the past 3 weeks or so. He reported 10 pound weight loss in the last month. Over the past week, the patient is also noted swelling in the bilateral feet and has a slight cough productive of thick clear sputum. Patient denied abdominal pain, nausea, or vomiting. He complained of severe constipation which has not improved despite treatment with mag citrate at home.  Dx: AKI.   Clinical Impression  Pt received in bed, and was agreeable to PT evaluation.  Pt expressed that he lives alone and is independent with gait, and ADL's, however his dtr assists him with driving places, and with some housework.  His dtr stops in to check on him every day.  During PT evaluation, he was independent with bed mobility, and transfers, but required supervision with gait due to IV pole.  At this point he is mobilizing at his baseline, and therefore, no skilled PT interventions are appropriate at this time. PT will sign off, but encouraged pt to mobilize frequently.     Follow Up Recommendations No PT follow up    Equipment Recommendations  None recommended by PT    Recommendations for Other Services       Precautions / Restrictions Precautions Precautions: None Restrictions Weight Bearing Restrictions: No       Mobility  Bed Mobility Overal bed mobility: Independent                Transfers Overall transfer level: Independent Equipment used: None                Ambulation/Gait Ambulation/Gait assistance: Supervision (due to IV pole) Ambulation Distance (Feet): 200 Feet Assistive device: None Gait Pattern/deviations: WFL(Within Functional Limits)     General Gait Details: Pt occasionally touching the nurses desk or the wall rail for a little stability.  Pt states he is mobilizing at his baseline level of function.  BORG rating is a 5/10.  Stairs            Wheelchair Mobility    Modified Rankin (Stroke Patients Only)       Balance Overall balance assessment: Modified Independent                                           Pertinent Vitals/Pain Pain Assessment: No/denies pain    Home Living   Living Arrangements: Alone Available Help at Discharge: Family (Dtr - comes by at least 1x per day - medication) Type of Home: Mobile home Home Access: Stairs to enter   Entrance Stairs-Number of Steps: 1 threshold step. Home Layout: One level Home Equipment: None      Prior Function     Gait / Transfers Assistance Needed: independent  ADL's / Homemaking  Assistance Needed: Dtr was assisting with driving, dtr also assisting with household activities.  Independent with dressing/bathing.         Hand Dominance   Dominant Hand: Right    Extremity/Trunk Assessment   Upper Extremity Assessment: Overall WFL for tasks assessed           Lower Extremity Assessment: Overall WFL for tasks assessed         Communication      Cognition Arousal/Alertness: Awake/alert Behavior During Therapy: WFL for tasks assessed/performed Overall Cognitive Status: Within Functional Limits for tasks assessed                      General Comments      Exercises     Assessment/Plan    PT Assessment Patent does not need any further PT  services  PT Problem List            PT Treatment Interventions      PT Goals (Current goals can be found in the Care Plan section)  Acute Rehab PT Goals PT Goal Formulation: All assessment and education complete, DC therapy    Frequency     Barriers to discharge        Co-evaluation               End of Session Equipment Utilized During Treatment: Gait belt Activity Tolerance: Patient tolerated treatment well Patient left: in chair;with call bell/phone within reach      Functional Assessment Tool Used: [employer] "6-clicks"  Functional Limitation: Mobility: Walking and moving around Mobility: Walking and Moving Around Current Status [phone]): 0 percent impaired, limited or restricted Mobility: Walking and Moving Around Goal Status [phone]): 0 percent impaired, limited or restricted Mobility: Walking and Moving Around Discharge Status [phone]): 0 percent impaired, limited or restricted    Time: 1437-1500 PT Time Calculation (min) (ACUTE ONLY): 23 min   Charges:   PT Evaluation $PT Eval Low Complexity: 1 Procedure PT Treatments $Gait Training: 8-22 mins   PT G Codes:   PT G-Codes **NOT FOR INPATIENT CLASS** Functional Assessment Tool Used: [employer] "6-clicks"  Functional Limitation: Mobility: Walking and moving around Mobility: Walking and Moving Around Current Status [phone]): 0 percent impaired, limited or restricted Mobility: Walking and Moving Around Goal Status [phone]): 0 percent impaired, limited or restricted Mobility: Walking and Moving Around Discharge Status [phone]): 0 percent impaired, limited or restricted    Beth Zonia Caplin, PT, DPT X: [id number]

## 2016-08-22 NOTE — Progress Notes (Signed)
*  PRELIMINARY RESULTS* Echocardiogram 2D Echocardiogram has been performed.  Bruce [name] 08/22/2016, 2:58 PM

## 2016-08-22 NOTE — Care Management Obs Status (Signed)
MEDICARE OBSERVATION STATUS NOTIFICATION   Patient Details  Name: Bruce [name] MRN: [id number] Date of Birth: Mar 28, 1946   Medicare Observation Status Notification Given:  Yes    Sherald Barge, RN 08/22/2016, 2:47 PM

## 2016-08-22 NOTE — Progress Notes (Signed)
Inpatient Diabetes Program Recommendations  AACE/ADA: New Consensus Statement on Inpatient Glycemic Control (2015)  Target Ranges:  Prepandial:   less than 140 mg/dL      Peak postprandial:   less than 180 mg/dL (1-2 hours)      Critically ill patients:  140 - 180 mg/dL  Results for AYINDE, RUSCHAK (MRN [id number]) as of 08/22/2016 09:16  Ref. Range 08/22/2016 08:49  Glucose-Capillary Latest Ref Range: 65 - 99 mg/dL 101 (H)   Results for TAYARI, RAMOS (MRN [id number]) as of 08/22/2016 09:16  Ref. Range 08/21/2016 19:32 08/22/2016 04:45  Glucose Latest Ref Range: 65 - 99 mg/dL 181 (H) 177 (H)    Review of Glycemic Control  Diabetes history: No Outpatient Diabetes medications: NA Current orders for Inpatient glycemic control: None  Inpatient Diabetes Program Recommendations: Correction (SSI): Noted lab glucose of 181 mg/dl on 08/21/16 and 177 mg/dl today at 4:45 am. May want to consider ordering CBGs with Novolog correction scale. HgbA1C: May want to consider ordering an A1C to evaluate glycemic control over the past 2-3 months.  Thanks, Barnie Alderman, RN, MSN, CDE Diabetes Coordinator Inpatient Diabetes Program [phone] (Team Pager from 8am to 5pm)

## 2016-08-23 ENCOUNTER — Observation Stay (HOSPITAL_COMMUNITY): Payer: Medicare Other

## 2016-08-23 DIAGNOSIS — C321 Malignant neoplasm of supraglottis: Secondary | ICD-10-CM | POA: Diagnosis not present

## 2016-08-23 DIAGNOSIS — N179 Acute kidney failure, unspecified: Secondary | ICD-10-CM | POA: Diagnosis not present

## 2016-08-23 DIAGNOSIS — F419 Anxiety disorder, unspecified: Secondary | ICD-10-CM | POA: Diagnosis not present

## 2016-08-23 DIAGNOSIS — E43 Unspecified severe protein-calorie malnutrition: Secondary | ICD-10-CM | POA: Insufficient documentation

## 2016-08-23 DIAGNOSIS — K5900 Constipation, unspecified: Secondary | ICD-10-CM | POA: Diagnosis not present

## 2016-08-23 LAB — CBC
HCT: 28 % — ABNORMAL LOW (ref 39.0–52.0)
Hemoglobin: 9.1 g/dL — ABNORMAL LOW (ref 13.0–17.0)
MCH: 28.5 pg (ref 26.0–34.0)
MCHC: 32.5 g/dL (ref 30.0–36.0)
MCV: 87.8 fL (ref 78.0–100.0)
PLATELETS: 89 10*3/uL — AB (ref 150–400)
RBC: 3.19 MIL/uL — ABNORMAL LOW (ref 4.22–5.81)
RDW: 15.7 % — AB (ref 11.5–15.5)
WBC: 4.2 10*3/uL (ref 4.0–10.5)

## 2016-08-23 LAB — URINALYSIS, ROUTINE W REFLEX MICROSCOPIC
BILIRUBIN URINE: NEGATIVE
Glucose, UA: NEGATIVE mg/dL
Hgb urine dipstick: NEGATIVE
Ketones, ur: NEGATIVE mg/dL
LEUKOCYTES UA: NEGATIVE
NITRITE: NEGATIVE
PH: 7 (ref 5.0–8.0)
Protein, ur: NEGATIVE mg/dL

## 2016-08-23 LAB — BASIC METABOLIC PANEL
ANION GAP: 8 (ref 5–15)
BUN: 41 mg/dL — ABNORMAL HIGH (ref 6–20)
CALCIUM: 7.7 mg/dL — AB (ref 8.9–10.3)
CO2: 32 mmol/L (ref 22–32)
Chloride: 99 mmol/L — ABNORMAL LOW (ref 101–111)
Creatinine, Ser: 1.73 mg/dL — ABNORMAL HIGH (ref 0.61–1.24)
GFR, EST AFRICAN AMERICAN: 44 mL/min — AB (ref >60)
GFR, EST NON AFRICAN AMERICAN: 38 mL/min — AB (ref >60)
Glucose, Bld: 158 mg/dL — ABNORMAL HIGH (ref 65–99)
Potassium: 3 mmol/L — ABNORMAL LOW (ref 3.5–5.1)
Sodium: 139 mmol/L (ref 135–145)

## 2016-08-23 LAB — CREATININE, URINE, RANDOM: CREATININE, URINE: 28.77 mg/dL

## 2016-08-23 LAB — GLUCOSE, CAPILLARY: GLUCOSE-CAPILLARY: 102 mg/dL — AB (ref 65–99)

## 2016-08-23 LAB — MAGNESIUM: MAGNESIUM: 1.4 mg/dL — AB (ref 1.7–2.4)

## 2016-08-23 LAB — SODIUM, URINE, RANDOM: SODIUM UR: 80 mmol/L

## 2016-08-23 MED ORDER — POTASSIUM CHLORIDE 20 MEQ PO PACK
20.0000 meq | PACK | Freq: Once | ORAL | Status: AC
Start: 2016-08-23 — End: 2016-08-23
  Administered 2016-08-23: 20 meq via ORAL
  Filled 2016-08-23: qty 1

## 2016-08-23 MED ORDER — MAGNESIUM SULFATE 50 % IJ SOLN
3.0000 g | Freq: Once | INTRAMUSCULAR | Status: DC
  Filled 2016-08-23: qty 6

## 2016-08-23 MED ORDER — POTASSIUM CHLORIDE CRYS ER 20 MEQ PO TBCR
40.0000 meq | EXTENDED_RELEASE_TABLET | Freq: Every day | ORAL | Status: DC
  Administered 2016-08-23: 40 meq via ORAL
  Filled 2016-08-23 (×2): qty 2

## 2016-08-23 MED ORDER — ACETAMINOPHEN 325 MG PO TABS
650.0000 mg | ORAL_TABLET | Freq: Four times a day (QID) | ORAL | Status: DC | PRN
  Filled 2016-08-23: qty 2

## 2016-08-23 MED ORDER — POTASSIUM CHLORIDE IN NACL 40-0.9 MEQ/L-% IV SOLN
INTRAVENOUS | Status: DC
  Administered 2016-08-23: 90 mL/h via INTRAVENOUS

## 2016-08-23 MED ORDER — POTASSIUM CHLORIDE IN NACL 40-0.9 MEQ/L-% IV SOLN
INTRAVENOUS | Status: AC
  Filled 2016-08-23: qty 1000

## 2016-08-23 MED ORDER — MAGNESIUM SULFATE 2 GM/50ML IV SOLN
4.0000 g | Freq: Once | INTRAVENOUS | Status: AC
  Administered 2016-08-23: 4 g via INTRAVENOUS
  Filled 2016-08-23: qty 100

## 2016-08-23 NOTE — Progress Notes (Signed)
Patient ID: Bruce [name], male   DOB: [birth year], 70 y.o.   MRN: [id number] The staff reported the patient having a fever of 100.27F. Tylenol 650 mg was ordered. Magnesium was 1.4 milligrams per deciliter and potassium 3.0 mmol/L. Replacement was ordered. 2 view chest x-ray, urinalysis and blood cultures were ordered.  Tennis Must, M.D. [phone].

## 2016-08-23 NOTE — Progress Notes (Signed)
[facility]                                                                              Patient Demographics  Bruce [name], is a 70 y.o. male, DOB - [birth year], [id number]  Admit date - 08/21/2016   Admitting Physician Vianne Bulls, MD  Outpatient Primary MD for the patient is [name], MD  Outpatient specialists:   LOS - 0  days    Chief Complaint  Patient presents with  . 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 Patient underwent his third cycle of cisplatin with radiation to the head and neck in the past week and has since been progressively weak and with a general sense of malaise. He reports not feeling as poorly after the first 2 cycles of treatment, but notes that he was able to continue oral intake in those instances, but has now been dependent on a G-tube for the past 3 weeks or so. He reported 10 pound weight loss in the last month. Over the past week, the patient is also noted swelling in the bilateral feet and has a slight cough productive of thick clear sputum. Patient denied abdominal pain, nausea, or vomiting. He complained of severe constipation which has not improved despite treatment with mag citrate at home.  Patient was found to have sodium 132, potassium 2.5, creatinine 2.46. Magnesium 1.4. Creatinine was 0.78 on 11/17 he was admitted for further workup.  Assessment & Plan     Acute kidney injury  -Improving , 1.7 today - Cr is 2.46 on admission, up from apparent baseline of 0.8  - Suspect this is a prerenal azotemia in setting of intravascular volume depletion - renal US showed no obstruction or hydronephrosis   Hypokalemia, hypomagnesemia - Replaced, placed on daily potassium replacement - Placed on IV magnesium replacement  Edema  hypoalbuminemia   - There is new development of bilateral pedal edema and early interstitial edema on CXR  -  albumin 2.3, possibly third spacing  - 2-D echo showed EF of 65-70% with grade 1 diastolic dysfunction, no pericardial effusion  - Nutrition consult, has G-tube   Laryngeal cancer  - Just completed 3rd cycle of cisplatin and radiation  - Ongoing management per oncology   - Requested oncology consult per patient's request and for any further recommendations given multiple electrolyte abnormalities, new pedal edema and shortness of breath, failure to thrive, loss of weight   COPD - No dyspnea or wheezing on admission  - Continue prn albuterol     Constipation  - Likely secondary to opiate analgesics  -  placed on daily Senokot-S and MiraLAX   Normocytic anemia, thrombocytopenia  - Hgb is 10.4 on admission, down from 11.1 a week prior, likely secondary to chemotherapy, no bleeding - Platelets 124,000 on admission with no bleeding, likely secondary to chemotherapy   GERD - No EGD report on file; has documented hx of PUD  -  Continue BID Protonix    Fever Overnight spiked a temp, chest x-ray clear, no pneumonia blood cultures pending UA clear, no UTI  Code Status: full  DVT Prophylaxis:  heparin  Family Communication: Discussed in detail with the patient, all imaging results, lab results explained to the patient   Disposition Plan:  hopefully next 24-48 hours   Time Spent in minutes  25 minutes  Procedures:    Consultants:   None  Antimicrobials      Medications  Scheduled Meds: . feeding supplement (ENSURE ENLIVE)  237 mL Oral BID BM  . feeding supplement (OSMOLITE 1.5 CAL)  273 mL Per Tube QHS  . feeding supplement (OSMOLITE 1.5 CAL)  474 mL Per Tube TID WC  . fentaNYL  25 mcg Transdermal Q72H  . heparin  5,000 Units Subcutaneous Q8H  . pantoprazole sodium  40 mg Per Tube BID  . polyethylene glycol  17 g Oral Daily  . potassium chloride  40 mEq  Oral Daily  . senna-docusate  1 tablet Oral BID  . sodium chloride flush  3 mL Intravenous Q12H   Continuous Infusions:  PRN Meds:.acetaminophen, albuterol, bisacodyl, HYDROcodone-acetaminophen, magic mouthwash **AND** lidocaine, LORazepam, morphine injection, prochlorperazine, zolpidem   Antibiotics   Anti-infectives    None        Subjective:   Daschel Larochelle was seen and examined today. Overnight low-grade temp 100.90F, no coughing or abdominal pain or diarrhea. Feels weak.  Patient denies dizziness, chest pain, shortness of breath, abdominal pain, N/V/D/C, numbess, tingling.    Objective:   Vitals:   08/22/16 0453 08/22/16 2219 08/23/16 0442 08/23/16 1150  BP: 113/79 137/62 (!) 120/58   Pulse: 74 61 77   Resp: 18 20 20    Temp: 98.6 F (37 C) 99 F (37.2 C) 100.1 F (37.8 C)   TempSrc: Oral Oral Oral   SpO2: 96% 97% 97% 97%  Weight: 70 kg (154 lb 4.8 oz)  68.9 kg (152 lb)   Height:        Intake/Output Summary (Last 24 hours) at 08/23/16 1355 Last data filed at 08/23/16 0951  Gross per 24 hour  Intake             2301 ml  Output             3275 ml  Net             -974 ml     Wt Readings from Last 3 Encounters:  08/23/16 68.9 kg (152 lb)  08/12/16 70.8 kg (156 lb)  08/03/16 71.2 kg (156 lb 14.4 oz)     Exam  General: Alert and oriented x 3, NAD, Sometimes difficult to comprehend due to laryngeal cancer   HEENT:   Neck:   Cardiovascular: S1 S2 auscultated, no rubs, murmurs or gallops. Regular rate and rhythm.  Respiratory: Decreased breath sounds at the bases  Gastrointestinal: Soft, nontender, nondistended, + bowel sounds, G tube   Ext: no cyanosis clubbing, 1+ edema  Neuro: No new deficits  Skin: No rashes  Psych: Normal affect and demeanor, alert and oriented x3    Data Reviewed:  I have personally reviewed following labs and imaging studies  Micro Results Recent Results (from the past 240 hour(s))  Culture, blood (routine x 2)      Status: None (Preliminary result)   Collection Time: 08/23/16  6:51 AM  Result Value Ref Range Status   Specimen Description BLOOD HAND RIGHT  Final   Special Requests BOTTLES  DRAWN AEROBIC AND ANAEROBIC 7 CC EACH  Final   Culture NO GROWTH < 12 HOURS  Final   Report Status PENDING  Incomplete  Culture, blood (routine x 2)     Status: None (Preliminary result)   Collection Time: 08/23/16  6:57 AM  Result Value Ref Range Status   Specimen Description BLOOD PORTA CATH  Final   Special Requests   Final    BOTTLES DRAWN AEROBIC AND ANAEROBIC 6 CC EACH BOTTLE   Culture NO GROWTH < 12 HOURS  Final   Report Status PENDING  Incomplete    Radiology Reports Dg Chest 2 View  Result Date: 08/23/2016 CLINICAL DATA:  Fever EXAM: CHEST  2 VIEW COMPARISON:  08/21/2016 FINDINGS: Left Port-A-Cath remains in place, unchanged. Interstitial prominence throughout the lungs, stable. Heart is normal size. Tortuosity of the thoracic aorta. No effusions. No acute bony abnormality. IMPRESSION: Stable interstitial prominence. Electronically Signed   By: Rolm Baptise M.D.   On: 08/23/2016 11:15   Dg Chest 2 View  Result Date: 08/21/2016 CLINICAL DATA:  Chest pain and weakness. Generalized weakness beginning 3 days ago. Productive cough with clear sputum and sore throat. Personal history of laryngeal cancer. EXAM: CHEST  2 VIEW COMPARISON:  One-view chest x-ray 06/27/2016 FINDINGS: Last leads is exaggerated by low lung volumes. Atherosclerotic calcifications are evident along the aorta. A left subclavian Port-A-Cath is accessed. Diffuse interstitial coarsening is again noted. Mild pulmonary vascular congestion is present. IMPRESSION: 1. Borderline cardiomegaly with pulmonary vascular congestion and probable mild edema. 2. Aortic atherosclerosis. 3. No focal airspace consolidation. 4. Left subclavian Port-A-Cath is accessed. Electronically Signed   By: San Morelle M.D.   On: 08/21/2016 20:13   US  Renal  Addendum Date: 08/22/2016   ADDENDUM REPORT: 08/22/2016 12:00 ADDENDUM: Repeat ultrasound was performed at the inferior pole of the LEFT kidney. Under real-time imaging and on obtained images, no evidence of LEFT inferior pole exophytic lesion is identified. The questioned abnormality on the earlier images corresponding to lobulated inferior LEFT renal parenchyma. No LEFT renal mass identified. Electronically Signed   By: Lavonia Dana M.D.   On: 08/22/2016 12:00   Result Date: 08/22/2016 CLINICAL DATA:  70 year old male with acute renal failure. Initial encounter. EXAM: RENAL / URINARY TRACT ULTRASOUND COMPLETE COMPARISON:  06/14/2016 PET CT.  03/02/2016 CT abdomen and pelvis. FINDINGS: Right Kidney: Length: 12.6 cm.  No hydronephrosis.  2.3 cm cyst. Left Kidney: Length: 12.9 cm. No hydronephrosis. Sonographer raises possibility of inferior medial 1.8 cm structure which cannot be confirmed as a true lesion or a cyst. No FDG avid mass was noted in this region on recent PET-CT. Patient will be recall for radiologist (Dr. Thornton Papas) directed ultrasound to determine if this represents an artifact versus mass. Bladder: Appears normal for degree of bladder distention. IMPRESSION: No hydronephrosis. 22.3 cm right renal cyst. Sonographer raises possibility of inferior medial 1.8 cm structure which cannot be confirmed as a true lesion or a cyst. No FDG avid mass was noted in this region on recent PET-CT. Patient will be recall for radiologist (Dr. Thornton Papas) directed ultrasound to determine if this represents an artifact versus mass. Electronically Signed: By: Genia Del M.D. On: 08/22/2016 09:11    Lab Data:  CBC:  Recent Labs Lab 08/21/16 1932 08/23/16 0900  WBC 7.4 4.2  HGB 10.4* 9.1*  HCT 31.3* 28.0*  MCV 86.0 87.8  PLT 124* 89*   Basic Metabolic Panel:  Recent Labs Lab 08/21/16 1932 08/22/16 0445 08/23/16  0427  NA 132* 135 139  K 2.5* 3.2* 3.0*  CL 88* 95* 99*  CO2 34* 33* 32  GLUCOSE  181* 177* 158*  BUN 58* 51* 41*  CREATININE 2.46* 2.15* 1.73*  CALCIUM 8.3* 8.0* 7.7*  MG 1.4* 1.8 1.4*   GFR: Estimated Creatinine Clearance: 38.7 mL/min (by C-G formula based on SCr of 1.73 mg/dL (H)). Liver Function Tests:  Recent Labs Lab 08/21/16 1932 08/22/16 0445  AST 22 19  ALT 37 31  ALKPHOS 64 57  BILITOT 0.6 0.4  PROT 5.6* 5.2*  ALBUMIN 2.6* 2.3*   No results for input(s): LIPASE, AMYLASE in the last 168 hours. No results for input(s): AMMONIA in the last 168 hours. Coagulation Profile: No results for input(s): INR, PROTIME in the last 168 hours. Cardiac Enzymes: No results for input(s): CKTOTAL, CKMB, CKMBINDEX, TROPONINI in the last 168 hours. BNP (last 3 results) No results for input(s): PROBNP in the last 8760 hours. HbA1C: No results for input(s): HGBA1C in the last 72 hours. CBG:  Recent Labs Lab 08/22/16 0849 08/22/16 2325 08/23/16 0833  GLUCAP 101* 215* 102*   Lipid Profile: No results for input(s): CHOL, HDL, LDLCALC, TRIG, CHOLHDL, LDLDIRECT in the last 72 hours. Thyroid Function Tests: No results for input(s): TSH, T4TOTAL, FREET4, T3FREE, THYROIDAB in the last 72 hours. Anemia Panel: No results for input(s): VITAMINB12, FOLATE, FERRITIN, TIBC, IRON, RETICCTPCT in the last 72 hours. Urine analysis:    Component Value Date/Time   COLORURINE YELLOW 08/23/2016 0846   APPEARANCEUR CLEAR 08/23/2016 0846   LABSPEC <1.005 (L) 08/23/2016 0846   PHURINE 7.0 08/23/2016 0846   GLUCOSEU NEGATIVE 08/23/2016 0846   HGBUR NEGATIVE 08/23/2016 0846   BILIRUBINUR NEGATIVE 08/23/2016 0846   KETONESUR NEGATIVE 08/23/2016 0846   PROTEINUR NEGATIVE 08/23/2016 0846   UROBILINOGEN 1.0 09/03/2007 0942   NITRITE NEGATIVE 08/23/2016 0846   LEUKOCYTESUR NEGATIVE 08/23/2016 0846     Franki Stemen M.D. [facility] 08/23/2016, 1:55 PM  Pager: [phone] Between 7am to 7pm - call Pager - [phone]  After 7pm go to [email or web address] - password  [email or web address]  Call night coverage person covering after 7pm

## 2016-08-23 NOTE — Care Management Note (Signed)
Case Management Note  Patient Details  Name: Bruce [name] MRN: [id number] Date of Birth: [birth year]  Subjective/Objective:  Patient adm from home with AKI. Lives alone with good family support.  Has recently finished services with [facility], would like to resume [facility] RN.                 Action/Plan: Romualdo Bolk of [facility] notified and will obtain orders from chart. Patient aware [facility] has 48 hours to initiate services.   Expected Discharge Date:        08/24/2016          Expected Discharge Plan:  [place]  In-House Referral:  NA  Discharge planning Services  CM Consult  Post Acute Care Choice:  Home Health Choice offered to:  Patient, Adult Children  DME Arranged:    DME Agency:     HH Arranged:  RN [place] Agency:  [place]  Status of Service:  In process, will continue to follow  If discussed at Long Length of Stay Meetings, dates discussed:    Additional Comments:  Brittyn Salaz, Chauncey [place], RN 08/23/2016, 3:34 PM

## 2016-08-24 ENCOUNTER — Other Ambulatory Visit (HOSPITAL_COMMUNITY): Payer: Medicare Other

## 2016-08-24 ENCOUNTER — Encounter (HOSPITAL_COMMUNITY): Admission: RE | Admit: 2016-08-24 | Payer: Medicare Other | Source: Ambulatory Visit

## 2016-08-24 ENCOUNTER — Ambulatory Visit (HOSPITAL_COMMUNITY): Payer: Medicare Other | Admitting: Oncology

## 2016-08-24 DIAGNOSIS — R634 Abnormal weight loss: Secondary | ICD-10-CM | POA: Diagnosis present

## 2016-08-24 DIAGNOSIS — Z79899 Other long term (current) drug therapy: Secondary | ICD-10-CM | POA: Diagnosis not present

## 2016-08-24 DIAGNOSIS — N179 Acute kidney failure, unspecified: Secondary | ICD-10-CM | POA: Diagnosis present

## 2016-08-24 DIAGNOSIS — C321 Malignant neoplasm of supraglottis: Secondary | ICD-10-CM | POA: Diagnosis not present

## 2016-08-24 DIAGNOSIS — D6959 Other secondary thrombocytopenia: Secondary | ICD-10-CM | POA: Diagnosis present

## 2016-08-24 DIAGNOSIS — K5903 Drug induced constipation: Secondary | ICD-10-CM | POA: Diagnosis present

## 2016-08-24 DIAGNOSIS — R627 Adult failure to thrive: Secondary | ICD-10-CM | POA: Diagnosis present

## 2016-08-24 DIAGNOSIS — Z6822 Body mass index (BMI) 22.0-22.9, adult: Secondary | ICD-10-CM | POA: Diagnosis not present

## 2016-08-24 DIAGNOSIS — I714 Abdominal aortic aneurysm, without rupture: Secondary | ICD-10-CM | POA: Diagnosis present

## 2016-08-24 DIAGNOSIS — E785 Hyperlipidemia, unspecified: Secondary | ICD-10-CM | POA: Diagnosis present

## 2016-08-24 DIAGNOSIS — K5900 Constipation, unspecified: Secondary | ICD-10-CM | POA: Diagnosis present

## 2016-08-24 DIAGNOSIS — Z931 Gastrostomy status: Secondary | ICD-10-CM | POA: Diagnosis not present

## 2016-08-24 DIAGNOSIS — R531 Weakness: Secondary | ICD-10-CM | POA: Diagnosis present

## 2016-08-24 DIAGNOSIS — J449 Chronic obstructive pulmonary disease, unspecified: Secondary | ICD-10-CM | POA: Diagnosis present

## 2016-08-24 DIAGNOSIS — C329 Malignant neoplasm of larynx, unspecified: Secondary | ICD-10-CM | POA: Diagnosis present

## 2016-08-24 DIAGNOSIS — E860 Dehydration: Secondary | ICD-10-CM | POA: Diagnosis present

## 2016-08-24 DIAGNOSIS — E8809 Other disorders of plasma-protein metabolism, not elsewhere classified: Secondary | ICD-10-CM | POA: Diagnosis present

## 2016-08-24 DIAGNOSIS — E876 Hypokalemia: Secondary | ICD-10-CM | POA: Diagnosis present

## 2016-08-24 DIAGNOSIS — D649 Anemia, unspecified: Secondary | ICD-10-CM | POA: Diagnosis present

## 2016-08-24 DIAGNOSIS — T451X5A Adverse effect of antineoplastic and immunosuppressive drugs, initial encounter: Secondary | ICD-10-CM | POA: Diagnosis present

## 2016-08-24 DIAGNOSIS — E43 Unspecified severe protein-calorie malnutrition: Secondary | ICD-10-CM | POA: Diagnosis present

## 2016-08-24 DIAGNOSIS — T402X5A Adverse effect of other opioids, initial encounter: Secondary | ICD-10-CM | POA: Diagnosis present

## 2016-08-24 DIAGNOSIS — F419 Anxiety disorder, unspecified: Secondary | ICD-10-CM | POA: Diagnosis present

## 2016-08-24 DIAGNOSIS — K219 Gastro-esophageal reflux disease without esophagitis: Secondary | ICD-10-CM | POA: Diagnosis present

## 2016-08-24 DIAGNOSIS — F17290 Nicotine dependence, other tobacco product, uncomplicated: Secondary | ICD-10-CM | POA: Diagnosis present

## 2016-08-24 LAB — ABO/RH: ABO/RH(D): O NEG

## 2016-08-24 LAB — GLUCOSE, CAPILLARY: Glucose-Capillary: 110 mg/dL — ABNORMAL HIGH (ref 65–99)

## 2016-08-24 LAB — BASIC METABOLIC PANEL
Anion gap: 8 (ref 5–15)
BUN: 34 mg/dL — AB (ref 6–20)
CHLORIDE: 95 mmol/L — AB (ref 101–111)
CO2: 34 mmol/L — AB (ref 22–32)
CREATININE: 1.7 mg/dL — AB (ref 0.61–1.24)
Calcium: 7.7 mg/dL — ABNORMAL LOW (ref 8.9–10.3)
GFR calc Af Amer: 45 mL/min — ABNORMAL LOW (ref >60)
GFR calc non Af Amer: 39 mL/min — ABNORMAL LOW (ref >60)
Glucose, Bld: 106 mg/dL — ABNORMAL HIGH (ref 65–99)
POTASSIUM: 2.8 mmol/L — AB (ref 3.5–5.1)
Sodium: 137 mmol/L (ref 135–145)

## 2016-08-24 LAB — URINE CULTURE: Culture: NO GROWTH

## 2016-08-24 LAB — CBC
HEMATOCRIT: 26.1 % — AB (ref 39.0–52.0)
Hemoglobin: 8.6 g/dL — ABNORMAL LOW (ref 13.0–17.0)
MCH: 28.9 pg (ref 26.0–34.0)
MCHC: 33 g/dL (ref 30.0–36.0)
MCV: 87.6 fL (ref 78.0–100.0)
PLATELETS: 83 10*3/uL — AB (ref 150–400)
RBC: 2.98 MIL/uL — ABNORMAL LOW (ref 4.22–5.81)
RDW: 15.6 % — AB (ref 11.5–15.5)
WBC: 4.5 10*3/uL (ref 4.0–10.5)

## 2016-08-24 LAB — PREPARE RBC (CROSSMATCH)

## 2016-08-24 LAB — UREA NITROGEN, URINE: UREA NITROGEN UR: 323 mg/dL

## 2016-08-24 MED ORDER — SODIUM CHLORIDE 0.9 % IV SOLN
Freq: Once | INTRAVENOUS | Status: DC
  Administered 2016-08-24: 16:00:00 via INTRAVENOUS

## 2016-08-24 MED ORDER — POTASSIUM CHLORIDE 20 MEQ/15ML (10%) PO SOLN
40.0000 meq | Freq: Every day | ORAL | Status: DC
  Administered 2016-08-24 – 2016-08-26 (×3): 40 meq via ORAL
  Filled 2016-08-24 (×4): qty 30

## 2016-08-24 MED ORDER — MAGNESIUM OXIDE 400 (241.3 MG) MG PO TABS
400.0000 mg | ORAL_TABLET | Freq: Three times a day (TID) | ORAL | Status: DC
  Administered 2016-08-24 – 2016-08-26 (×6): 400 mg
  Filled 2016-08-24 (×6): qty 1

## 2016-08-24 MED ORDER — DIPHENHYDRAMINE HCL 50 MG/ML IJ SOLN
25.0000 mg | Freq: Once | INTRAMUSCULAR | Status: AC
  Administered 2016-08-24: 25 mg via INTRAVENOUS
  Filled 2016-08-24: qty 1

## 2016-08-24 MED ORDER — POTASSIUM CHLORIDE 20 MEQ/15ML (10%) PO SOLN
40.0000 meq | ORAL | Status: AC
  Administered 2016-08-24 – 2016-08-25 (×4): 40 meq via ORAL
  Filled 2016-08-24 (×3): qty 30

## 2016-08-24 MED ORDER — POTASSIUM CHLORIDE CRYS ER 20 MEQ PO TBCR: 40.0000 meq | EXTENDED_RELEASE_TABLET | ORAL | Status: DC

## 2016-08-24 MED ORDER — SODIUM CHLORIDE 0.9 % IV SOLN
INTRAVENOUS | Status: DC
  Administered 2016-08-24 – 2016-08-26 (×4): via INTRAVENOUS

## 2016-08-24 MED ORDER — ACETAMINOPHEN 325 MG PO TABS
650.0000 mg | ORAL_TABLET | Freq: Once | ORAL | Status: AC
  Administered 2016-08-24: 650 mg via ORAL
  Filled 2016-08-24: qty 2

## 2016-08-24 NOTE — Progress Notes (Signed)
[facility]                                                                              Patient Demographics  Bruce [name], is a 70 y.o. male, DOB - 1946-07-04, RN:[id number]  Admit date - 08/21/2016   Admitting Physician Vianne Bulls, MD  Outpatient Primary MD for the patient is [name], MD  Outpatient specialists:   LOS - 0  days    Chief Complaint  Patient presents with  . 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 Patient underwent his third cycle of cisplatin with radiation to the head and neck in the past week and has since been progressively weak and with a general sense of malaise. He reports not feeling as poorly after the first 2 cycles of treatment, but notes that he was able to continue oral intake in those instances, but has now been dependent on a G-tube for the past 3 weeks or so. He reported 10 pound weight loss in the last month. Over the past week, the patient is also noted swelling in the bilateral feet and has a slight cough productive of thick clear sputum. Patient denied abdominal pain, nausea, or vomiting. He complained of severe constipation which has not improved despite treatment with mag citrate at home.  Patient was found to have sodium 132, potassium 2.5, creatinine 2.46. Magnesium 1.4. Creatinine was 0.78 on 11/17 he was admitted for further workup.  Assessment & Plan     Acute kidney injury  -Improving , 1.7 today - Cr is 2.46 on admission, up from apparent baseline of 0.8  - Suspect this is a prerenal azotemia in setting of intravascular volume depletion - renal US showed no obstruction or hydronephrosis  -Start IVF at 75 cc/hr  Hypokalemia, hypomagnesemia - Give K/Mg via PEG.  Edema hypoalbuminemia   - There is new development  of bilateral pedal edema and early interstitial edema on CXR  -  albumin 2.3, possibly third spacing  - 2-D echo showed EF of 65-70% with grade 1 diastolic dysfunction, no pericardial effusion  - Nutrition consult, has G-tube   Laryngeal cancer  - Just completed 3rd cycle of cisplatin and radiation  - Ongoing management per oncology   - Appreciate oncology input and recommendations.   COPD - No dyspnea or wheezing on admission  - Continue prn albuterol     Constipation  - Likely secondary to opiate analgesics  -  placed on daily Senokot-S and MiraLAX   Normocytic anemia, thrombocytopenia  - Hgb is 10.4 on admission, down from 11.1 a week prior, likely secondary to chemotherapy, no bleeding - Platelets 124,000 on admission with no bleeding, likely secondary to chemotherapy   GERD - No EGD report on file; has documented hx of PUD  - Continue BID Protonix     Code Status: full  DVT Prophylaxis:  heparin  Family Communication: Discussed in detail with the patient, all  imaging results, lab results explained to the patient   Disposition Plan:  hopefully next 24-48 hours   Time Spent in minutes  25 minutes  Procedures:    Consultants:   Oncology  Antimicrobials    None  Medications  Scheduled Meds: . sodium chloride   Intravenous Once  . acetaminophen  650 mg Oral Once  . diphenhydrAMINE  25 mg Intravenous Once  . feeding supplement (ENSURE ENLIVE)  237 mL Oral BID BM  . feeding supplement (OSMOLITE 1.5 CAL)  273 mL Per Tube QHS  . feeding supplement (OSMOLITE 1.5 CAL)  474 mL Per Tube TID WC  . fentaNYL  25 mcg Transdermal Q72H  . heparin  5,000 Units Subcutaneous Q8H  . pantoprazole sodium  40 mg Per Tube BID  . polyethylene glycol  17 g Oral Daily  . potassium chloride  40 mEq Oral Daily  . senna-docusate  1 tablet Oral BID  . sodium chloride flush  3 mL Intravenous Q12H   Continuous Infusions:  PRN Meds:.acetaminophen, albuterol, bisacodyl,  HYDROcodone-acetaminophen, magic mouthwash **AND** lidocaine, LORazepam, morphine injection, prochlorperazine, zolpidem   Antibiotics   Anti-infectives    None        Subjective:   Bruce [name] was seen and examined today. Overnight low-grade temp 100.62F, no coughing or abdominal pain or diarrhea. Feels weak.  Patient denies dizziness, chest pain, shortness of breath, abdominal pain, N/V/D/C, numbess, tingling.    Objective:   Vitals:   08/23/16 1150 08/23/16 1514 08/23/16 2100 08/24/16 0500  BP:  (!) 124/58 140/69 (!) 143/68  Pulse:  67 68 69  Resp:  16 18 18   Temp:  99.1 F (37.3 C) 99.4 F (37.4 C) 99.8 F (37.7 C)  TempSrc:  Oral Oral Oral  SpO2: 97% 99% 98% 99%  Weight:    69.9 kg (154 lb)  Height:        Intake/Output Summary (Last 24 hours) at 08/24/16 1536 Last data filed at 08/24/16 0752  Gross per 24 hour  Intake                0 ml  Output             1650 ml  Net            -1650 ml     Wt Readings from Last 3 Encounters:  08/24/16 69.9 kg (154 lb)  08/12/16 70.8 kg (156 lb)  08/03/16 71.2 kg (156 lb 14.4 oz)     Exam  General: Alert and oriented x 3, NAD, Sometimes difficult to comprehend due to laryngeal cancer   HEENT:   Neck:   Cardiovascular: S1 S2 auscultated, no rubs, murmurs or gallops. Regular rate and rhythm.  Respiratory: Decreased breath sounds at the bases  Gastrointestinal: Soft, nontender, nondistended, + bowel sounds, G tube   Ext: no cyanosis clubbing, 1+ edema  Neuro: No new deficits  Skin: No rashes  Psych: Normal affect and demeanor, alert and oriented x3    Data Reviewed:  I have personally reviewed following labs and imaging studies  Micro Results Recent Results (from the past 240 hour(s))  Culture, blood (routine x 2)     Status: None (Preliminary result)   Collection Time: 08/23/16  6:51 AM  Result Value Ref Range Status   Specimen Description BLOOD HAND RIGHT  Final   Special Requests BOTTLES DRAWN  AEROBIC AND ANAEROBIC 7 CC EACH  Final   Culture NO GROWTH 1 DAY  Final  Report Status PENDING  Incomplete  Culture, blood (routine x 2)     Status: None (Preliminary result)   Collection Time: 08/23/16  6:57 AM  Result Value Ref Range Status   Specimen Description BLOOD PORTA CATH DRAWN BY RN  Final   Special Requests   Final    BOTTLES DRAWN AEROBIC AND ANAEROBIC 6 CC EACH BOTTLE   Culture NO GROWTH 1 DAY  Final   Report Status PENDING  Incomplete  Urine culture     Status: None   Collection Time: 08/23/16  8:46 AM  Result Value Ref Range Status   Specimen Description URINE, CLEAN CATCH  Final   Special Requests NONE  Final   Culture NO GROWTH Performed at [facility]   Final   Report Status 08/24/2016 FINAL  Final    Radiology Reports Dg Chest 2 View  Result Date: 08/23/2016 CLINICAL DATA:  Fever EXAM: CHEST  2 VIEW COMPARISON:  08/21/2016 FINDINGS: Left Port-A-Cath remains in place, unchanged. Interstitial prominence throughout the lungs, stable. Heart is normal size. Tortuosity of the thoracic aorta. No effusions. No acute bony abnormality. IMPRESSION: Stable interstitial prominence. Electronically Signed   By: Rolm Baptise M.D.   On: 08/23/2016 11:15   Dg Chest 2 View  Result Date: 08/21/2016 CLINICAL DATA:  Chest pain and weakness. Generalized weakness beginning 3 days ago. Productive cough with clear sputum and sore throat. Personal history of laryngeal cancer. EXAM: CHEST  2 VIEW COMPARISON:  One-view chest x-ray 06/27/2016 FINDINGS: Last leads is exaggerated by low lung volumes. Atherosclerotic calcifications are evident along the aorta. A left subclavian Port-A-Cath is accessed. Diffuse interstitial coarsening is again noted. Mild pulmonary vascular congestion is present. IMPRESSION: 1. Borderline cardiomegaly with pulmonary vascular congestion and probable mild edema. 2. Aortic atherosclerosis. 3. No focal airspace consolidation. 4. Left subclavian Port-A-Cath is  accessed. Electronically Signed   By: San Morelle M.D.   On: 08/21/2016 20:13   US Renal  Addendum Date: 08/22/2016   ADDENDUM REPORT: 08/22/2016 12:00 ADDENDUM: Repeat ultrasound was performed at the inferior pole of the LEFT kidney. Under real-time imaging and on obtained images, no evidence of LEFT inferior pole exophytic lesion is identified. The questioned abnormality on the earlier images corresponding to lobulated inferior LEFT renal parenchyma. No LEFT renal mass identified. Electronically Signed   By: Lavonia Dana M.D.   On: 08/22/2016 12:00   Result Date: 08/22/2016 CLINICAL DATA:  70 year old male with acute renal failure. Initial encounter. EXAM: RENAL / URINARY TRACT ULTRASOUND COMPLETE COMPARISON:  06/14/2016 PET CT.  03/02/2016 CT abdomen and pelvis. FINDINGS: Right Kidney: Length: 12.6 cm.  No hydronephrosis.  2.3 cm cyst. Left Kidney: Length: 12.9 cm. No hydronephrosis. Sonographer raises possibility of inferior medial 1.8 cm structure which cannot be confirmed as a true lesion or a cyst. No FDG avid mass was noted in this region on recent PET-CT. Patient will be recall for radiologist (Dr. Thornton Papas) directed ultrasound to determine if this represents an artifact versus mass. Bladder: Appears normal for degree of bladder distention. IMPRESSION: No hydronephrosis. 22.3 cm right renal cyst. Sonographer raises possibility of inferior medial 1.8 cm structure which cannot be confirmed as a true lesion or a cyst. No FDG avid mass was noted in this region on recent PET-CT. Patient will be recall for radiologist (Dr. Thornton Papas) directed ultrasound to determine if this represents an artifact versus mass. Electronically Signed: By: Genia Del M.D. On: 08/22/2016 09:11    Lab Data:  CBC:  Recent Labs Lab 08/21/16 1932 08/23/16 0900 08/24/16 0503  WBC 7.4 4.2 4.5  HGB 10.4* 9.1* 8.6*  HCT 31.3* 28.0* 26.1*  MCV 86.0 87.8 87.6  PLT 124* 89* 83*   Basic Metabolic Panel:  Recent  Labs Lab 08/21/16 1932 08/22/16 0445 08/23/16 0427 08/24/16 0503  NA 132* 135 139 137  K 2.5* 3.2* 3.0* 2.8*  CL 88* 95* 99* 95*  CO2 34* 33* 32 34*  GLUCOSE 181* 177* 158* 106*  BUN 58* 51* 41* 34*  CREATININE 2.46* 2.15* 1.73* 1.70*  CALCIUM 8.3* 8.0* 7.7* 7.7*  MG 1.4* 1.8 1.4*  --    GFR: Estimated Creatinine Clearance: 40 mL/min (by C-G formula based on SCr of 1.7 mg/dL (H)). Liver Function Tests:  Recent Labs Lab 08/21/16 1932 08/22/16 0445  AST 22 19  ALT 37 31  ALKPHOS 64 57  BILITOT 0.6 0.4  PROT 5.6* 5.2*  ALBUMIN 2.6* 2.3*   No results for input(s): LIPASE, AMYLASE in the last 168 hours. No results for input(s): AMMONIA in the last 168 hours. Coagulation Profile: No results for input(s): INR, PROTIME in the last 168 hours. Cardiac Enzymes: No results for input(s): CKTOTAL, CKMB, CKMBINDEX, TROPONINI in the last 168 hours. BNP (last 3 results) No results for input(s): PROBNP in the last 8760 hours. HbA1C: No results for input(s): HGBA1C in the last 72 hours. CBG:  Recent Labs Lab 08/22/16 0849 08/22/16 2325 08/23/16 0833 08/24/16 0743  GLUCAP 101* 215* 102* 110*   Lipid Profile: No results for input(s): CHOL, HDL, LDLCALC, TRIG, CHOLHDL, LDLDIRECT in the last 72 hours. Thyroid Function Tests: No results for input(s): TSH, T4TOTAL, FREET4, T3FREE, THYROIDAB in the last 72 hours. Anemia Panel: No results for input(s): VITAMINB12, FOLATE, FERRITIN, TIBC, IRON, RETICCTPCT in the last 72 hours. Urine analysis:    Component Value Date/Time   COLORURINE YELLOW 08/23/2016 0846   APPEARANCEUR CLEAR 08/23/2016 0846   LABSPEC <1.005 (L) 08/23/2016 0846   PHURINE 7.0 08/23/2016 0846   GLUCOSEU NEGATIVE 08/23/2016 0846   HGBUR NEGATIVE 08/23/2016 0846   BILIRUBINUR NEGATIVE 08/23/2016 0846   KETONESUR NEGATIVE 08/23/2016 0846   PROTEINUR NEGATIVE 08/23/2016 0846   UROBILINOGEN 1.0 09/03/2007 0942   NITRITE NEGATIVE 08/23/2016 0846   LEUKOCYTESUR  NEGATIVE 08/23/2016 0846     Jerilee Hoh ACOSTA,ESTELA M.D. [facility] 08/24/2016, 3:36 PM  Pager: [phone] Between 7am to 7pm - call Pager - [phone]  After 7pm go to [email or web address] - password [email or web address]  Call night coverage person covering after 7pm

## 2016-08-24 NOTE — Consult Note (Signed)
[facility] Consultation Oncology  Name: Bruce [name]      MRN: [id number]    Location: A340/A340-01  Date: 08/24/2016 Time:10:06 AM   REFERRING PHYSICIAN:  Estill Cotta, MD ([facility])  REASON FOR CONSULT:  70 year patient with laryngeal ca now on chemo, now admitted with loss of weight, pedal edema, failure to thrive, sob, electrolyte disturbances, please follow with [place] for any new recommendations.   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  HISTORY OF PRESENT ILLNESS:  Mr. 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  He is S/P 3 cycles of Cisplatin+XRT.  He finished Cisplatin on 08/12/2016.  He had 4 more XRT treatments as of 08/21/2016 (date of hospitalization).    Cancer of aryepiglottic fold or interarytenoid fold, laryngeal aspect ([place])   05/16/2016 Procedure    Microdirect laryngoscopy and biopsy by Dr. Benjamine Mola      05/18/2016 Pathology Results    Diagnosis 1. Larynx, biopsy, Right aryepiglottic - INVASIVE POORLY DIFFERENTIATED CARCINOMA. - SEE COMMENT. 2. Larynx, biopsy, Left Aryepiglottic Fold - INVASIVE POORLY      06/14/2016 PET scan    1. Left-sided primary centered at the piriform sinus and aryepiglottic fold. 2. Bilateral cervical nodal metastasis. 3. No extracervical metastatic disease identified. 4. Bilateral pulmonary hypermetabolism is suspicious for developing interstitial lung disease (likely nonspecific interstitial pneumonia) superimposed upon centrilobular and paraseptal emphysema. Infectious process is felt less likely. 5. Hypermetabolic focus in the left side of the prostate is nonspecific. Consider correlation with PSA level and physical exam. 6. Right latissimus muscular hypermetabolism is likely incidental but  warrants followup attention.      06/27/2016 Procedure     Port-A-Cath insertion, EGD with PEG placement by Dr. Arnoldo Morale      07/01/2016 - 08/12/2016 Chemotherapy    The patient had palonosetron (ALOXI) injection 0.25 mg, 0.25 mg, Intravenous,  Once, 3 of 3 cycles  CISplatin (PLATINOL) 191 mg in sodium chloride 0.9 % 500 mL chemo infusion, 100 mg/m2 = 191 mg, Intravenous,  Once, 3 of 3 cycles  fosaprepitant (EMEND) 150 mg, dexamethasone (DECADRON) 12 mg in sodium chloride 0.9 % 145 mL IVPB, , Intravenous,  Once, 3 of 3 cycles  for chemotherapy treatment.        07/01/2016 -  Radiation Therapy         08/21/2016 -  Hospital Admission    Admit date: 08/21/2016 Admission diagnosis: AKI Additional comments: Reported to ED with chest pain.      He notes that he reported to the ED with chest pain.  He subsequently got admitted namely because of acute renal failure.  He admits to a decrease in H2O PO intake to 1 bottle per day prior to his admission.  Previously, he was drinking 3-4 bottles per day.    He notes that his pain is currently well controlled.  He denies any pain today.  He notes a good BM yesterday and denies any constipation.  He is coughing and clearing secretions.  PAST MEDICAL HISTORY:   Past Medical History:  Diagnosis Date  . AAA (abdominal aortic aneurysm) ([place])   . Anxiety   . Cancer (HCC)    laryngeal  . Cancer of aryepiglottic fold or interarytenoid fold, laryngeal aspect ([place]) 06/15/2016  . Carpal tunnel syndrome   .  Cellulitis   . COPD (chronic obstructive pulmonary disease) ([place])   . Epigastric hernia   . GERD (gastroesophageal reflux disease)   . Hyperlipidemia   . Hypertension     used to take amlodipine, none now    ALLERGIES: No Known Allergies    MEDICATIONS: I have reviewed the patient's current medications.     PAST SURGICAL HISTORY Past Surgical History:  Procedure Laterality Date  . APPENDECTOMY    . DIRECT LARYNGOSCOPY N/A 05/16/2016    Procedure: MICRO DIRECT LARYNGOSCOPY WITH BIOPSY;  Surgeon: Leta Baptist, MD;  Location: [facility];  Service: ENT;  Laterality: N/A;  . ESOPHAGOGASTRODUODENOSCOPY N/A 06/27/2016   Procedure: ESOPHAGOGASTRODUODENOSCOPY (EGD);  Surgeon: Aviva Signs, MD;  Location: [facility];  Service: General;  Laterality: N/A;  . HEMORROIDECTOMY    . IR GENERIC HISTORICAL  07/14/2016   IR RADIOLOGIST EVAL & MGMT [facility]  . IR GENERIC HISTORICAL  07/14/2016   IR PATIENT EVAL TECH 0-60 MINS [facility]  . PEG PLACEMENT N/A 06/27/2016   Procedure: PERCUTANEOUS ENDOSCOPIC GASTROSTOMY (PEG) PLACEMENT;  Surgeon: Aviva Signs, MD;  Location: [facility];  Service: General;  Laterality: N/A;  . PORTACATH PLACEMENT Left 06/27/2016   Procedure: INSERTION PORT-A-CATH;  Surgeon: Aviva Signs, MD;  Location: [facility];  Service: General;  Laterality: Left;  [facility] VASCULAR SURGERY Right 2008   stent to right common iliac artery    FAMILY HISTORY: Family History  Problem Relation Age of Onset  . Heart failure Mother   . Coronary artery disease Father   . Stroke Father   . Alcoholism Father   . Heart disease Brother     MI    SOCIAL HISTORY:  reports that he has been smoking Cigars.  He has been smoking about 0.00 packs per day for the past 12.00 years. He has never used smokeless tobacco. He reports that he does not drink alcohol or use drugs.  PERFORMANCE STATUS: The patient's performance status is 2 - Symptomatic, <50% confined to bed  PHYSICAL EXAM: Most Recent Vital Signs: Blood pressure (!) 143/68, pulse 69, temperature 99.8 F (37.7 C), temperature source Oral, resp. rate 18, height _0  (1.778 m), weight 154 lb (69.9 kg), SpO2 99 %. General appearance: alert, cooperative, appears stated age, no distress and hoarse voice, alone in hospital room. Head: Normocephalic, without obvious abnormality, atraumatic Eyes: negative findings: lids and lashes normal, conjunctivae and sclerae normal and corneas  clear Neck: supple, symmetrical, trachea midline and erythema at treatment site Lungs: rhonchi posterior - right Heart: regular rate and rhythm Abdomen: soft, non-tender; bowel sounds normal; no masses,  no organomegaly Extremities: extremities normal, atraumatic, no cyanosis or edema Skin: erythema of neck Neurologic: Grossly normal  LABORATORY DATA:  Results for orders placed or performed during the hospital encounter of 08/21/16 (from the past 48 hour(s))  Glucose, capillary     Status: Abnormal   Collection Time: 08/22/16 11:25 PM  Result Value Ref Range   Glucose-Capillary 215 (H) 65 - 99 mg/dL  Basic metabolic panel     Status: Abnormal   Collection Time: 08/23/16  4:27 AM  Result Value Ref Range   Sodium 139 135 - 145 mmol/L   Potassium 3.0 (L) 3.5 - 5.1 mmol/L   Chloride 99 (L) 101 - 111 mmol/L   CO2 32 22 - 32 mmol/L   Glucose, Bld 158 (H) 65 - 99 mg/dL   BUN 41 (H) 6 - 20 mg/dL   Creatinine, Ser 1.73 (H)  0.61 - 1.24 mg/dL   Calcium 7.7 (L) 8.9 - 10.3 mg/dL   GFR calc non Af Amer 38 (L) >60 mL/min   GFR calc Af Amer 44 (L) >60 mL/min    Comment: (NOTE) The eGFR has been calculated using the CKD EPI equation. This calculation has not been validated in all clinical situations. eGFR's persistently <60 mL/min signify possible Chronic Kidney Disease.    Anion gap 8 5 - 15  Magnesium     Status: Abnormal   Collection Time: 08/23/16  4:27 AM  Result Value Ref Range   Magnesium 1.4 (L) 1.7 - 2.4 mg/dL  Culture, blood (routine x 2)     Status: None (Preliminary result)   Collection Time: 08/23/16  6:51 AM  Result Value Ref Range   Specimen Description BLOOD HAND RIGHT    Special Requests BOTTLES DRAWN AEROBIC AND ANAEROBIC 7 CC EACH    Culture NO GROWTH 1 DAY    Report Status PENDING   Culture, blood (routine x 2)     Status: None (Preliminary result)   Collection Time: 08/23/16  6:57 AM  Result Value Ref Range   Specimen Description BLOOD PORTA CATH DRAWN BY RN     Special Requests      BOTTLES DRAWN AEROBIC AND ANAEROBIC 6 CC EACH BOTTLE   Culture NO GROWTH 1 DAY    Report Status PENDING   Glucose, capillary     Status: Abnormal   Collection Time: 08/23/16  8:33 AM  Result Value Ref Range   Glucose-Capillary 102 (H) 65 - 99 mg/dL   Comment 1 Notify RN    Comment 2 Document in Chart   Urinalysis, Routine w reflex microscopic (not at [facility])     Status: Abnormal   Collection Time: 08/23/16  8:46 AM  Result Value Ref Range   Color, Urine YELLOW YELLOW   APPearance CLEAR CLEAR   Specific Gravity, Urine <1.005 (L) 1.005 - 1.030   pH 7.0 5.0 - 8.0   Glucose, UA NEGATIVE NEGATIVE mg/dL   Hgb urine dipstick NEGATIVE NEGATIVE   Bilirubin Urine NEGATIVE NEGATIVE   Ketones, ur NEGATIVE NEGATIVE mg/dL   Protein, ur NEGATIVE NEGATIVE mg/dL   Nitrite NEGATIVE NEGATIVE   Leukocytes, UA NEGATIVE NEGATIVE    Comment: MICROSCOPIC NOT DONE ON URINES WITH NEGATIVE PROTEIN, BLOOD, LEUKOCYTES, NITRITE, OR GLUCOSE <1000 mg/dL.  CBC     Status: Abnormal   Collection Time: 08/23/16  9:00 AM  Result Value Ref Range   WBC 4.2 4.0 - 10.5 K/uL   RBC 3.19 (L) 4.22 - 5.81 MIL/uL   Hemoglobin 9.1 (L) 13.0 - 17.0 g/dL   HCT 28.0 (L) 39.0 - 52.0 %   MCV 87.8 78.0 - 100.0 fL   MCH 28.5 26.0 - 34.0 pg   MCHC 32.5 30.0 - 36.0 g/dL   RDW 15.7 (H) 11.5 - 15.5 %   Platelets 89 (L) 150 - 400 K/uL    Comment: SPECIMEN CHECKED FOR CLOTS PLATELET COUNT CONFIRMED BY SMEAR   Sodium, urine, random     Status: None   Collection Time: 08/23/16  9:50 AM  Result Value Ref Range   Sodium, Ur 80 mmol/L  Creatinine, urine, random     Status: None   Collection Time: 08/23/16  9:50 AM  Result Value Ref Range   Creatinine, Urine 28.77 mg/dL  Basic metabolic panel     Status: Abnormal   Collection Time: 08/24/16  5:03 AM  Result Value Ref Range  Sodium 137 135 - 145 mmol/L   Potassium 2.8 (L) 3.5 - 5.1 mmol/L   Chloride 95 (L) 101 - 111 mmol/L   CO2 34 (H) 22 - 32 mmol/L    Glucose, Bld 106 (H) 65 - 99 mg/dL   BUN 34 (H) 6 - 20 mg/dL   Creatinine, Ser 1.70 (H) 0.61 - 1.24 mg/dL   Calcium 7.7 (L) 8.9 - 10.3 mg/dL   GFR calc non Af Amer 39 (L) >60 mL/min   GFR calc Af Amer 45 (L) >60 mL/min    Comment: (NOTE) The eGFR has been calculated using the CKD EPI equation. This calculation has not been validated in all clinical situations. eGFR's persistently <60 mL/min signify possible Chronic Kidney Disease.    Anion gap 8 5 - 15  CBC     Status: Abnormal   Collection Time: 08/24/16  5:03 AM  Result Value Ref Range   WBC 4.5 4.0 - 10.5 K/uL   RBC 2.98 (L) 4.22 - 5.81 MIL/uL   Hemoglobin 8.6 (L) 13.0 - 17.0 g/dL   HCT 26.1 (L) 39.0 - 52.0 %   MCV 87.6 78.0 - 100.0 fL   MCH 28.9 26.0 - 34.0 pg   MCHC 33.0 30.0 - 36.0 g/dL   RDW 15.6 (H) 11.5 - 15.5 %   Platelets 83 (L) 150 - 400 K/uL    Comment: SPECIMEN CHECKED FOR CLOTS CONSISTENT WITH PREVIOUS RESULT   Glucose, capillary     Status: Abnormal   Collection Time: 08/24/16  7:43 AM  Result Value Ref Range   Glucose-Capillary 110 (H) 65 - 99 mg/dL   Comment 1 Notify RN    Comment 2 Document in Chart       RADIOGRAPHY: Dg Chest 2 View  Result Date: 08/23/2016 CLINICAL DATA:  Fever EXAM: CHEST  2 VIEW COMPARISON:  08/21/2016 FINDINGS: Left Port-A-Cath remains in place, unchanged. Interstitial prominence throughout the lungs, stable. Heart is normal size. Tortuosity of the thoracic aorta. No effusions. No acute bony abnormality. IMPRESSION: Stable interstitial prominence. Electronically Signed   By: Rolm Baptise M.D.   On: 08/23/2016 11:15       PATHOLOGY:  N/A   ASSESSMENT/PLAN:  1. 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  He is S/P 3 cycles of Cisplatin+XRT.  He finished Cisplatin on 08/12/2016.  He had 4 more XRT treatments as of 08/21/2016 (date of hospitalization). Oncology history is updated.  I have contacted Radiation Oncology and updated them  on the patient's hospitalizations.  At time of discharge, he will need to be set-up with appointments to complete XRT (4 more fractions). 2. AKI- improving.  Continue with IV hydration.  Cause was secondary to decreased oral intake. 3. Pain- well controlled on current regimen.  No constipation (according to patient). 4. Anemia, hemo-concentrated at time of admission + chemotherapy.  Order is placed for 1 unit of PRBCs as a HGB ~ 10 g/dL is needed for effective XRT. 5. Hypokalemia, recommend replacement therapy. 6. Thrombocytopenia- hemodilution + chemotherapy.  Stable.  All questions were answered. The patient knows to call the clinic with any problems, questions or concerns. We can certainly see the patient much sooner if necessary.  Patient and plan discussed with Dr. Ancil Linsey and she is in agreement with the aforementioned.   KEFALAS,THOMAS, PA-C 08/24/2016 10:06 AM   HPI and PE as detailed.  A/P  Stage IVA Squamous Cell CA of bilateral larynx: patient has struggled with adequate  intake throughout therapy. Nutrition has worked diligently with him. He has been receiving IVF twice weekly. This may need to be increased post discharge ARF -- improving as noted with hydration.  Pain -- currently denies Would transfuse 1 U prbc as noted. Will notify XRT of admission. He has completed chemotherapy. Will need supportive care through healing process. Will follow-up with [place] within several days of hospital discharge.  Donald Pore MD

## 2016-08-25 LAB — CBC
HCT: 30.5 % — ABNORMAL LOW (ref 39.0–52.0)
HEMOGLOBIN: 10 g/dL — AB (ref 13.0–17.0)
MCH: 28.7 pg (ref 26.0–34.0)
MCHC: 32.8 g/dL (ref 30.0–36.0)
MCV: 87.4 fL (ref 78.0–100.0)
PLATELETS: 82 10*3/uL — AB (ref 150–400)
RBC: 3.49 MIL/uL — AB (ref 4.22–5.81)
RDW: 15.4 % (ref 11.5–15.5)
WBC: 3.7 10*3/uL — ABNORMAL LOW (ref 4.0–10.5)

## 2016-08-25 LAB — BASIC METABOLIC PANEL
ANION GAP: 8 (ref 5–15)
BUN: 29 mg/dL — ABNORMAL HIGH (ref 6–20)
CALCIUM: 7.9 mg/dL — AB (ref 8.9–10.3)
CO2: 29 mmol/L (ref 22–32)
CREATININE: 1.4 mg/dL — AB (ref 0.61–1.24)
Chloride: 102 mmol/L (ref 101–111)
GFR, EST AFRICAN AMERICAN: 57 mL/min — AB (ref >60)
GFR, EST NON AFRICAN AMERICAN: 49 mL/min — AB (ref >60)
Glucose, Bld: 111 mg/dL — ABNORMAL HIGH (ref 65–99)
Potassium: 4.2 mmol/L (ref 3.5–5.1)
SODIUM: 139 mmol/L (ref 135–145)

## 2016-08-25 LAB — GLUCOSE, CAPILLARY: GLUCOSE-CAPILLARY: 105 mg/dL — AB (ref 65–99)

## 2016-08-25 NOTE — Progress Notes (Signed)
Pt had a residual of 285 ml, bedtime dose of Osmolite 273 ml held. MD paged, will continue to monitor.

## 2016-08-25 NOTE — Progress Notes (Signed)
[facility]                                                                              Patient Demographics  Bruce [name], is a 70 y.o. male, DOB - [birth year], RN:[id number]  Admit date - 08/21/2016   Admitting Physician Vianne Bulls, MD  Outpatient Primary MD for the patient is [name], MD  Outpatient specialists:   LOS - 1  days    Chief Complaint  Patient presents with  . 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 Patient underwent his third cycle of cisplatin with radiation to the head and neck in the past week and has since been progressively weak and with a general sense of malaise. He reports not feeling as poorly after the first 2 cycles of treatment, but notes that he was able to continue oral intake in those instances, but has now been dependent on a G-tube for the past 3 weeks or so. He reported 10 pound weight loss in the last month. Over the past week, the patient is also noted swelling in the bilateral feet and has a slight cough productive of thick clear sputum. Patient denied abdominal pain, nausea, or vomiting. He complained of severe constipation which has not improved despite treatment with mag citrate at home.  Patient was found to have sodium 132, potassium 2.5, creatinine 2.46. Magnesium 1.4. Creatinine was 0.78 on 11/17 he was admitted for further workup.  Assessment & Plan     Acute kidney injury  -Improving , 1.4 today - Cr is 2.46 on admission, up from apparent baseline of 0.8  - Suspect this is a prerenal azotemia in setting of intravascular volume depletion - renal US showed no obstruction or hydronephrosis  -Start IVF at 75 cc/hr  Hypokalemia, hypomagnesemia - Give K/Mg via PEG.  Edema hypoalbuminemia   - There is new development  of bilateral pedal edema and early interstitial edema on CXR  -  albumin 2.3, possibly third spacing  - 2-D echo showed EF of 65-70% with grade 1 diastolic dysfunction, no pericardial effusion  - Nutrition consult, has G-tube   Laryngeal cancer  - Just completed 3rd cycle of cisplatin and radiation  - Ongoing management per oncology   - Appreciate oncology input and recommendations.   COPD - No dyspnea or wheezing on admission  - Continue prn albuterol     Constipation  - Likely secondary to opiate analgesics  -  placed on daily Senokot-S and MiraLAX   Normocytic anemia, thrombocytopenia  - Hgb is 10.4 on admission, down from 11.1 a week prior, likely secondary to chemotherapy, no bleeding - Platelets 124,000 on admission with no bleeding, likely secondary to chemotherapy   GERD - No EGD report on file; has documented hx of PUD  - Continue BID Protonix     Code Status: full  DVT Prophylaxis:  heparin  Family Communication: Discussed in detail with the patient, all  imaging results, lab results explained to the patient   Disposition Plan:  hopefully next 24-48 hours   Time Spent in minutes  25 minutes  Procedures:    Consultants:   Oncology  Antimicrobials    None  Medications  Scheduled Meds: . feeding supplement (ENSURE ENLIVE)  237 mL Oral BID BM  . feeding supplement (OSMOLITE 1.5 CAL)  273 mL Per Tube QHS  . feeding supplement (OSMOLITE 1.5 CAL)  474 mL Per Tube TID WC  . fentaNYL  25 mcg Transdermal Q72H  . heparin  5,000 Units Subcutaneous Q8H  . magnesium oxide  400 mg Per Tube TID  . pantoprazole sodium  40 mg Per Tube BID  . polyethylene glycol  17 g Oral Daily  . potassium chloride  40 mEq Oral Daily  . senna-docusate  1 tablet Oral BID  . sodium chloride flush  3 mL Intravenous Q12H   Continuous Infusions: . sodium chloride 75 mL/hr at 08/25/16 0823   PRN Meds:.acetaminophen, albuterol, bisacodyl, HYDROcodone-acetaminophen, magic  mouthwash **AND** lidocaine, LORazepam, morphine injection, prochlorperazine, zolpidem   Antibiotics   Anti-infectives    None        Subjective:   Bruce [name] was seen and examined today. Overnight low-grade temp 100.8F, no coughing or abdominal pain or diarrhea. Feels weak.  Patient denies dizziness, chest pain, shortness of breath, abdominal pain, N/V/D/C, numbess, tingling.    Objective:   Vitals:   08/24/16 1900 08/24/16 2140 08/25/16 0602 08/25/16 1500  BP: (!) 148/73 (!) 145/58 138/65 136/69  Pulse: 63 71 66 67  Resp: 18 18 18 18   Temp: 97.9 F (36.6 C) 98.2 F (36.8 C) 98.2 F (36.8 C) 99.2 F (37.3 C)  TempSrc: Oral Oral Oral Oral  SpO2: 96% 99% 100% 100%  Weight:   68.5 kg (151 lb)   Height:        Intake/Output Summary (Last 24 hours) at 08/25/16 1722 Last data filed at 08/25/16 1500  Gross per 24 hour  Intake           2872.5 ml  Output             4200 ml  Net          -1327.5 ml     Wt Readings from Last 3 Encounters:  08/25/16 68.5 kg (151 lb)  08/12/16 70.8 kg (156 lb)  08/03/16 71.2 kg (156 lb 14.4 oz)     Exam  General: Alert and oriented x 3, NAD, Sometimes difficult to comprehend due to laryngeal cancer   HEENT:   Neck:   Cardiovascular: S1 S2 auscultated, no rubs, murmurs or gallops. Regular rate and rhythm.  Respiratory: Decreased breath sounds at the bases  Gastrointestinal: Soft, nontender, nondistended, + bowel sounds, G tube   Ext: no cyanosis clubbing, 1+ edema  Neuro: No new deficits  Skin: No rashes  Psych: Normal affect and demeanor, alert and oriented x3    Data Reviewed:  I have personally reviewed following labs and imaging studies  Micro Results Recent Results (from the past 240 hour(s))  Culture, blood (routine x 2)     Status: None (Preliminary result)   Collection Time: 08/23/16  6:51 AM  Result Value Ref Range Status   Specimen Description BLOOD HAND RIGHT  Final   Special Requests BOTTLES DRAWN  AEROBIC AND ANAEROBIC 7 CC EACH  Final   Culture NO GROWTH 2 DAYS  Final   Report Status PENDING  Incomplete  Culture, blood (routine  x 2)     Status: None (Preliminary result)   Collection Time: 08/23/16  6:57 AM  Result Value Ref Range Status   Specimen Description BLOOD PORTA CATH DRAWN BY RN  Final   Special Requests   Final    BOTTLES DRAWN AEROBIC AND ANAEROBIC 6 CC EACH BOTTLE   Culture NO GROWTH 2 DAYS  Final   Report Status PENDING  Incomplete  Urine culture     Status: None   Collection Time: 08/23/16  8:46 AM  Result Value Ref Range Status   Specimen Description URINE, CLEAN CATCH  Final   Special Requests NONE  Final   Culture NO GROWTH Performed at [facility]   Final   Report Status 08/24/2016 FINAL  Final    Radiology Reports Dg Chest 2 View  Result Date: 08/23/2016 CLINICAL DATA:  Fever EXAM: CHEST  2 VIEW COMPARISON:  08/21/2016 FINDINGS: Left Port-A-Cath remains in place, unchanged. Interstitial prominence throughout the lungs, stable. Heart is normal size. Tortuosity of the thoracic aorta. No effusions. No acute bony abnormality. IMPRESSION: Stable interstitial prominence. Electronically Signed   By: Rolm Baptise M.D.   On: 08/23/2016 11:15   Dg Chest 2 View  Result Date: 08/21/2016 CLINICAL DATA:  Chest pain and weakness. Generalized weakness beginning 3 days ago. Productive cough with clear sputum and sore throat. Personal history of laryngeal cancer. EXAM: CHEST  2 VIEW COMPARISON:  One-view chest x-ray 06/27/2016 FINDINGS: Last leads is exaggerated by low lung volumes. Atherosclerotic calcifications are evident along the aorta. A left subclavian Port-A-Cath is accessed. Diffuse interstitial coarsening is again noted. Mild pulmonary vascular congestion is present. IMPRESSION: 1. Borderline cardiomegaly with pulmonary vascular congestion and probable mild edema. 2. Aortic atherosclerosis. 3. No focal airspace consolidation. 4. Left subclavian Port-A-Cath is  accessed. Electronically Signed   By: San Morelle M.D.   On: 08/21/2016 20:13   US Renal  Addendum Date: 08/22/2016   ADDENDUM REPORT: 08/22/2016 12:00 ADDENDUM: Repeat ultrasound was performed at the inferior pole of the LEFT kidney. Under real-time imaging and on obtained images, no evidence of LEFT inferior pole exophytic lesion is identified. The questioned abnormality on the earlier images corresponding to lobulated inferior LEFT renal parenchyma. No LEFT renal mass identified. Electronically Signed   By: Lavonia Dana M.D.   On: 08/22/2016 12:00   Result Date: 08/22/2016 CLINICAL DATA:  70 year old male with acute renal failure. Initial encounter. EXAM: RENAL / URINARY TRACT ULTRASOUND COMPLETE COMPARISON:  06/14/2016 PET CT.  03/02/2016 CT abdomen and pelvis. FINDINGS: Right Kidney: Length: 12.6 cm.  No hydronephrosis.  2.3 cm cyst. Left Kidney: Length: 12.9 cm. No hydronephrosis. Sonographer raises possibility of inferior medial 1.8 cm structure which cannot be confirmed as a true lesion or a cyst. No FDG avid mass was noted in this region on recent PET-CT. Patient will be recall for radiologist (Dr. Thornton Papas) directed ultrasound to determine if this represents an artifact versus mass. Bladder: Appears normal for degree of bladder distention. IMPRESSION: No hydronephrosis. 22.3 cm right renal cyst. Sonographer raises possibility of inferior medial 1.8 cm structure which cannot be confirmed as a true lesion or a cyst. No FDG avid mass was noted in this region on recent PET-CT. Patient will be recall for radiologist (Dr. Thornton Papas) directed ultrasound to determine if this represents an artifact versus mass. Electronically Signed: By: Genia Del M.D. On: 08/22/2016 09:11    Lab Data:  CBC:  Recent Labs Lab 08/21/16 1932 08/23/16 0900 08/24/16 0503  08/25/16 0520  WBC 7.4 4.2 4.5 3.7*  HGB 10.4* 9.1* 8.6* 10.0*  HCT 31.3* 28.0* 26.1* 30.5*  MCV 86.0 87.8 87.6 87.4  PLT 124* 89* 83* 82*    Basic Metabolic Panel:  Recent Labs Lab 08/21/16 1932 08/22/16 0445 08/23/16 0427 08/24/16 0503 08/25/16 0520  NA 132* 135 139 137 139  K 2.5* 3.2* 3.0* 2.8* 4.2  CL 88* 95* 99* 95* 102  CO2 34* 33* 32 34* 29  GLUCOSE 181* 177* 158* 106* 111*  BUN 58* 51* 41* 34* 29*  CREATININE 2.46* 2.15* 1.73* 1.70* 1.40*  CALCIUM 8.3* 8.0* 7.7* 7.7* 7.9*  MG 1.4* 1.8 1.4*  --   --    GFR: Estimated Creatinine Clearance: 47.6 mL/min (by C-G formula based on SCr of 1.4 mg/dL (H)). Liver Function Tests:  Recent Labs Lab 08/21/16 1932 08/22/16 0445  AST 22 19  ALT 37 31  ALKPHOS 64 57  BILITOT 0.6 0.4  PROT 5.6* 5.2*  ALBUMIN 2.6* 2.3*   No results for input(s): LIPASE, AMYLASE in the last 168 hours. No results for input(s): AMMONIA in the last 168 hours. Coagulation Profile: No results for input(s): INR, PROTIME in the last 168 hours. Cardiac Enzymes: No results for input(s): CKTOTAL, CKMB, CKMBINDEX, TROPONINI in the last 168 hours. BNP (last 3 results) No results for input(s): PROBNP in the last 8760 hours. HbA1C: No results for input(s): HGBA1C in the last 72 hours. CBG:  Recent Labs Lab 08/22/16 0849 08/22/16 2325 08/23/16 0833 08/24/16 0743 08/25/16 0743  GLUCAP 101* 215* 102* 110* 105*   Lipid Profile: No results for input(s): CHOL, HDL, LDLCALC, TRIG, CHOLHDL, LDLDIRECT in the last 72 hours. Thyroid Function Tests: No results for input(s): TSH, T4TOTAL, FREET4, T3FREE, THYROIDAB in the last 72 hours. Anemia Panel: No results for input(s): VITAMINB12, FOLATE, FERRITIN, TIBC, IRON, RETICCTPCT in the last 72 hours. Urine analysis:    Component Value Date/Time   COLORURINE YELLOW 08/23/2016 0846   APPEARANCEUR CLEAR 08/23/2016 0846   LABSPEC <1.005 (L) 08/23/2016 0846   PHURINE 7.0 08/23/2016 0846   GLUCOSEU NEGATIVE 08/23/2016 0846   HGBUR NEGATIVE 08/23/2016 0846   BILIRUBINUR NEGATIVE 08/23/2016 0846   KETONESUR NEGATIVE 08/23/2016 0846   PROTEINUR  NEGATIVE 08/23/2016 0846   UROBILINOGEN 1.0 09/03/2007 0942   NITRITE NEGATIVE 08/23/2016 0846   LEUKOCYTESUR NEGATIVE 08/23/2016 0846     Jerilee Hoh ACOSTA,ESTELA M.D. [facility] 08/25/2016, 5:22 PM  Pager: [phone] Between 7am to 7pm - call Pager - [phone]  After 7pm go to [email or web address] - password [email or web address]  Call night coverage person covering after 7pm

## 2016-08-26 LAB — CBC
HEMATOCRIT: 29.9 % — AB (ref 39.0–52.0)
Hemoglobin: 9.7 g/dL — ABNORMAL LOW (ref 13.0–17.0)
MCH: 28.8 pg (ref 26.0–34.0)
MCHC: 32.4 g/dL (ref 30.0–36.0)
MCV: 88.7 fL (ref 78.0–100.0)
Platelets: 90 10*3/uL — ABNORMAL LOW (ref 150–400)
RBC: 3.37 MIL/uL — ABNORMAL LOW (ref 4.22–5.81)
RDW: 15.4 % (ref 11.5–15.5)
WBC: 2.1 10*3/uL — AB (ref 4.0–10.5)

## 2016-08-26 LAB — BASIC METABOLIC PANEL
ANION GAP: 8 (ref 5–15)
BUN: 27 mg/dL — ABNORMAL HIGH (ref 6–20)
CALCIUM: 7.8 mg/dL — AB (ref 8.9–10.3)
CO2: 28 mmol/L (ref 22–32)
Chloride: 104 mmol/L (ref 101–111)
Creatinine, Ser: 1.38 mg/dL — ABNORMAL HIGH (ref 0.61–1.24)
GFR calc Af Amer: 58 mL/min — ABNORMAL LOW (ref >60)
GFR calc non Af Amer: 50 mL/min — ABNORMAL LOW (ref >60)
GLUCOSE: 92 mg/dL (ref 65–99)
Potassium: 3.3 mmol/L — ABNORMAL LOW (ref 3.5–5.1)
Sodium: 140 mmol/L (ref 135–145)

## 2016-08-26 LAB — GLUCOSE, CAPILLARY: Glucose-Capillary: 86 mg/dL (ref 65–99)

## 2016-08-26 MED ORDER — POTASSIUM CHLORIDE 20 MEQ/15ML (10%) PO SOLN: 40.0000 meq | Freq: Every day | ORAL | 0 refills | Status: AC

## 2016-08-26 MED ORDER — HEPARIN SOD (PORK) LOCK FLUSH 100 UNIT/ML IV SOLN
500.0000 [IU] | INTRAVENOUS | Status: DC | PRN
  Administered 2016-08-26: 500 [IU]
  Filled 2016-08-26: qty 5

## 2016-08-26 MED ORDER — POTASSIUM CHLORIDE CRYS ER 20 MEQ PO TBCR
40.0000 meq | EXTENDED_RELEASE_TABLET | Freq: Once | ORAL | Status: AC
  Administered 2016-08-26: 40 meq via ORAL
  Filled 2016-08-26: qty 2

## 2016-08-26 NOTE — Progress Notes (Signed)
Pt CBG 86 

## 2016-08-26 NOTE — Progress Notes (Signed)
Pt has Peg tube residual of 250 cc, Osmolite 474 ml held at this time. Will continue to monitor patient.

## 2016-08-26 NOTE — Progress Notes (Signed)
Pt's port deaccessed. Port site clean dry and intact. Discharge instructions including medications and follow up appointments were reviewed and discussed with patient. Pt verbalized understanding of discharge instructions including medications and follow up appointments. All questions were answered and no further questions at this time. Pt in stable condition and in no acute distress at time of discharge. Pt escorted by RN.

## 2016-08-26 NOTE — Discharge Summary (Signed)
Physician Discharge Summary  [name] [id number] DOB: 1945-11-04 DOA: 08/21/2016  PCP: Bruce Chroman, MD  Admit date: 08/21/2016 Discharge date: 08/26/2016  Time spent: 45 minutes  Recommendations for Outpatient Follow-up:  -Will be discharged home today. -Advised to follow up with PCP and oncologist as scheduled.   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 Myna [name] on 11/26: Bruce [name] is a 70 y.o. 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 Patient underwent his third cycle of cisplatin with radiation to the head and neck in the past week and has since been progressively weak and with a general sense of malaise. He reports not feeling as poorly after the first 2 cycles of treatment, but notes that he was able to continue oral intake in those instances, but has now been dependent on a G-tube for the past 3 weeks or so. He denies any fevers or chills and denies chest pain or palpitations, but notes a 10 pound weight loss in the last month. Over the past week, the patient is also noted swelling in the bilateral feet and has a slight cough productive of thick clear sputum. Patient denies abdominal pain, nausea, or vomiting,  but notes severe constipation which has not improved despite treatment with mag citrate at home. There's been no recent fall or trauma and the patient denies headache, change in vision or hearing, loss of coordination, or focal numbness or weakness. There has been no rash or wound, and no dysuria or flank pain.   ED Course: Upon arrival to the ED, patient is found to be afebrile, saturating well on room air, with soft blood pressure, but vitals otherwise stable. EKG features sinus rhythm with septal Q waves and chest x-ray is notable for pulmonary vascular congestion and probable mild edema. BMP features a sodium 132, chloride 88, potassium 2.5, BUN 58, and serum creatinine at 2.46, up from an apparent baseline of 0.8. CBC features a normocytic anemia with hemoglobin of 10.4, down from 11.1 earlier this month. CBC is also notable for mild thrombocytopenia with platelets 124,000. Troponin is undetectable. Patient was given 2 L normal saline in the emergency department and 40 mEq oral potassium. He was also treated with morphine for throat pain. He has remained hemodynamically stable in the ED and in no apparent respiratory distress. He will be observed on the telemetry unit for ongoing evaluation and management of acute kidney injury with critical hypokalemia and intravascular volume depletion, complicated by peripheral edema and probable early pulmonary edema.  Hospital Course:  Acute kidney injury  -Improving , 1.38 on DC - Cr is 2.46 on admission, up from apparent baseline of 0.8  - Suspect this is a prerenal azotemia in setting of intravascular volume depletion -  renal US showed no obstruction or hydronephrosis   Hypokalemia, hypomagnesemia - Give K/Mg via PEG. -Will continue to replace at home.  Edema hypoalbuminemia   - There is new development of bilateral pedal edema and early interstitial edema on CXR  -  albumin 2.3, possibly third spacing  - 2-D echo showed EF of 65-70% with grade 1  diastolic dysfunction, no pericardial effusion  - Nutrition consult, has G-tube   Laryngeal cancer  - Just completed 3rd cycle of cisplatin and radiation  - Ongoing management per oncology  - Appreciate oncology input and recommendations.   COPD - No dyspnea or wheezing on admission  - Continue prn albuterol    Constipation  - Likely secondary to opiate analgesics  -  placed on daily Senokot-S and MiraLAX   Normocytic anemia, thrombocytopenia  - Hgb is 10.4 on admission, down from 11.1 a week prior, likely secondary to chemotherapy, no bleeding - Platelets 124,000 on admission with no bleeding, likely secondary to chemotherapy  GERD - No EGD report on file; has documented hx of PUD  - Continue BID Protonix      Procedures:  None   Consultations:  Oncology   Discharge Instructions  Discharge Instructions    Increase activity slowly    Complete by:  As directed        Medication List    STOP taking these medications   FLUCELVAX QUADRIVALENT Susp Generic drug:  Influenza Vac Subunit Quad   lidocaine-prilocaine cream Commonly known as:  EMLA   ondansetron 8 MG tablet Commonly known as:  ZOFRAN   traMADol 50 MG tablet Commonly known as:  ULTRAM     TAKE these medications   albuterol 108 (90 Base) MCG/ACT inhaler Commonly known as:  PROVENTIL HFA;VENTOLIN HFA Inhale 1 puff into the lungs every 6 (six) hours as needed for wheezing or shortness of breath.   atorvastatin 10 MG tablet Commonly known as:  LIPITOR Take 10 mg by mouth daily.   CISPLATIN IV Inject into the vein.   dexamethasone 4 MG tablet Commonly known as:  DECADRON Take 2 tablets by mouth once a day on the day after chemotherapy and then take 2 tablets two times a day for 2 days. Take with food.   fentaNYL 25 MCG/HR patch Commonly known as:  DURAGESIC - dosed mcg/hr Place 1 patch (25 mcg total) onto the skin every 3 (three) days.   HYDROcodone-acetaminophen 7.5-325 mg/15  ml solution Commonly known as:  HYCET Take 15 mLs by mouth every 4 (four) hours as needed for moderate pain. What changed:  Another medication with the same name was removed. Continue taking this medication, and follow the directions you see here.   LORazepam 1 MG tablet Commonly known as:  ATIVAN Take 1 mg by mouth at bedtime.   magic mouthwash w/lidocaine Soln Swish and swallow 79ml four times a day for mouth pain/soreness. What changed:  how much to take  how to take this  when to take this  reasons to take this  additional instructions   pantoprazole 40 MG tablet Commonly known as:  PROTONIX Take 40 mg by mouth 2 (two) times daily as needed.   potassium chloride 20 MEQ/15ML (10%) Soln Take 30 mLs (40 mEq total) by mouth daily. Start taking on:  08/27/2016   prochlorperazine 10 MG tablet Commonly known as:  COMPAZINE Take 1 tablet (10 mg total) by mouth every 6 (six) hours as needed for nausea or vomiting.   TRILIPIX 135 MG  capsule Generic drug:  Choline Fenofibrate Take 135 mg by mouth daily.      No Known Allergies Follow-up Information    Bruce Chroman, MD. Schedule an appointment as soon as possible for a visit in 2 week(s).   Specialty:  Internal Medicine Contact information: [address] [place] [address] [phone]            The results of significant diagnostics from this hospitalization (including imaging, microbiology, ancillary and laboratory) are listed below for reference.    Significant Diagnostic Studies: Dg Chest 2 View  Result Date: 08/23/2016 CLINICAL DATA:  Fever EXAM: CHEST  2 VIEW COMPARISON:  08/21/2016 FINDINGS: Left Port-A-Cath remains in place, unchanged. Interstitial prominence throughout the lungs, stable. Heart is normal size. Tortuosity of the thoracic aorta. No effusions. No acute bony abnormality. IMPRESSION: Stable interstitial prominence. Electronically Signed   By: Rolm Baptise M.D.   On: 08/23/2016 11:15   Dg Chest  2 View  Result Date: 08/21/2016 CLINICAL DATA:  Chest pain and weakness. Generalized weakness beginning 3 days ago. Productive cough with clear sputum and sore throat. Personal history of laryngeal cancer. EXAM: CHEST  2 VIEW COMPARISON:  One-view chest x-ray 06/27/2016 FINDINGS: Last leads is exaggerated by low lung volumes. Atherosclerotic calcifications are evident along the aorta. A left subclavian Port-A-Cath is accessed. Diffuse interstitial coarsening is again noted. Mild pulmonary vascular congestion is present. IMPRESSION: 1. Borderline cardiomegaly with pulmonary vascular congestion and probable mild edema. 2. Aortic atherosclerosis. 3. No focal airspace consolidation. 4. Left subclavian Port-A-Cath is accessed. Electronically Signed   By: San Morelle M.D.   On: 08/21/2016 20:13   US Renal  Addendum Date: 08/22/2016   ADDENDUM REPORT: 08/22/2016 12:00 ADDENDUM: Repeat ultrasound was performed at the inferior pole of the LEFT kidney. Under real-time imaging and on obtained images, no evidence of LEFT inferior pole exophytic lesion is identified. The questioned abnormality on the earlier images corresponding to lobulated inferior LEFT renal parenchyma. No LEFT renal mass identified. Electronically Signed   By: Lavonia Dana M.D.   On: 08/22/2016 12:00   Result Date: 08/22/2016 CLINICAL DATA:  70 year old male with acute renal failure. Initial encounter. EXAM: RENAL / URINARY TRACT ULTRASOUND COMPLETE COMPARISON:  06/14/2016 PET CT.  03/02/2016 CT abdomen and pelvis. FINDINGS: Right Kidney: Length: 12.6 cm.  No hydronephrosis.  2.3 cm cyst. Left Kidney: Length: 12.9 cm. No hydronephrosis. Sonographer raises possibility of inferior medial 1.8 cm structure which cannot be confirmed as a true lesion or a cyst. No FDG avid mass was noted in this region on recent PET-CT. Patient will be recall for radiologist (Dr. Thornton Papas) directed ultrasound to determine if this represents an artifact versus mass.  Bladder: Appears normal for degree of bladder distention. IMPRESSION: No hydronephrosis. 22.3 cm right renal cyst. Sonographer raises possibility of inferior medial 1.8 cm structure which cannot be confirmed as a true lesion or a cyst. No FDG avid mass was noted in this region on recent PET-CT. Patient will be recall for radiologist (Dr. Thornton Papas) directed ultrasound to determine if this represents an artifact versus mass. Electronically Signed: By: Genia Del M.D. On: 08/22/2016 09:11    Microbiology: Recent Results (from the past 240 hour(s))  Culture, blood (routine x 2)     Status: None (Preliminary result)   Collection Time: 08/23/16  6:51 AM  Result Value Ref Range Status   Specimen Description BLOOD HAND RIGHT  Final   Special Requests BOTTLES DRAWN AEROBIC AND ANAEROBIC 7  CC EACH  Final   Culture NO GROWTH 3 DAYS  Final   Report Status PENDING  Incomplete  Culture, blood (routine x 2)     Status: None (Preliminary result)   Collection Time: 08/23/16  6:57 AM  Result Value Ref Range Status   Specimen Description BLOOD PORTA CATH DRAWN BY RN  Final   Special Requests   Final    BOTTLES DRAWN AEROBIC AND ANAEROBIC 6 CC EACH BOTTLE   Culture NO GROWTH 3 DAYS  Final   Report Status PENDING  Incomplete  Urine culture     Status: None   Collection Time: 08/23/16  8:46 AM  Result Value Ref Range Status   Specimen Description URINE, CLEAN CATCH  Final   Special Requests NONE  Final   Culture NO GROWTH Performed at [facility]   Final   Report Status 08/24/2016 FINAL  Final     Labs: Basic Metabolic Panel:  Recent Labs Lab 08/21/16 1932 08/22/16 0445 08/23/16 0427 08/24/16 0503 08/25/16 0520 08/26/16 0430  NA 132* 135 139 137 139 140  K 2.5* 3.2* 3.0* 2.8* 4.2 3.3*  CL 88* 95* 99* 95* 102 104  CO2 34* 33* 32 34* 29 28  GLUCOSE 181* 177* 158* 106* 111* 92  BUN 58* 51* 41* 34* 29* 27*  CREATININE 2.46* 2.15* 1.73* 1.70* 1.40* 1.38*  CALCIUM 8.3* 8.0* 7.7* 7.7*  7.9* 7.8*  MG 1.4* 1.8 1.4*  --   --   --    Liver Function Tests:  Recent Labs Lab 08/21/16 1932 08/22/16 0445  AST 22 19  ALT 37 31  ALKPHOS 64 57  BILITOT 0.6 0.4  PROT 5.6* 5.2*  ALBUMIN 2.6* 2.3*   No results for input(s): LIPASE, AMYLASE in the last 168 hours. No results for input(s): AMMONIA in the last 168 hours. CBC:  Recent Labs Lab 08/21/16 1932 08/23/16 0900 08/24/16 0503 08/25/16 0520 08/26/16 0430  WBC 7.4 4.2 4.5 3.7* 2.1*  HGB 10.4* 9.1* 8.6* 10.0* 9.7*  HCT 31.3* 28.0* 26.1* 30.5* 29.9*  MCV 86.0 87.8 87.6 87.4 88.7  PLT 124* 89* 83* 82* 90*   Cardiac Enzymes: No results for input(s): CKTOTAL, CKMB, CKMBINDEX, TROPONINI in the last 168 hours. BNP: BNP (last 3 results) No results for input(s): BNP in the last 8760 hours.  ProBNP (last 3 results) No results for input(s): PROBNP in the last 8760 hours.  CBG:  Recent Labs Lab 08/22/16 2325 08/23/16 0833 08/24/16 0743 08/25/16 0743 08/26/16 0751  GLUCAP 215* 102* 110* 105* 86       Signed:  HERNANDEZ ACOSTA,Bruce [name]  [facility] Pager: [phone] 08/26/2016, 11:40 AM

## 2016-08-28 LAB — TYPE AND SCREEN
Blood Product Expiration Date: 201712082359
Blood Product Expiration Date: 201712192359
ISSUE DATE / TIME: 201711291557
Unit Type and Rh: 9500
Unit Type and Rh: 9500

## 2016-08-31 ENCOUNTER — Encounter (HOSPITAL_COMMUNITY)
Admission: RE | Admit: 2016-08-31 | Discharge: 2016-08-31 | Disposition: A | Discharge status: Home / Self Care | Payer: Medicare Other | Source: Ambulatory Visit | Attending: Hematology & Oncology | Admitting: Hematology & Oncology

## 2016-08-31 DIAGNOSIS — C321 Malignant neoplasm of supraglottis: Secondary | ICD-10-CM | POA: Diagnosis present

## 2016-08-31 LAB — CULTURE, BLOOD (ROUTINE X 2)
Culture: NO GROWTH
Culture: NO GROWTH

## 2016-08-31 MED ORDER — SODIUM CHLORIDE 0.9 % IV SOLN
INTRAVENOUS | Status: DC
  Administered 2016-08-31: 1000 mL via INTRAVENOUS

## 2016-08-31 MED ORDER — HEPARIN SOD (PORK) LOCK FLUSH 100 UNIT/ML IV SOLN
INTRAVENOUS | Status: AC
Start: 2016-08-31 — End: 2016-08-31
  Filled 2016-08-31: qty 5

## 2016-08-31 MED ORDER — HEPARIN SOD (PORK) LOCK FLUSH 100 UNIT/ML IV SOLN
500.0000 [IU] | INTRAVENOUS | Status: AC | PRN
  Administered 2016-08-31: 500 [IU]

## 2016-09-02 ENCOUNTER — Encounter (HOSPITAL_COMMUNITY)
Admission: RE | Admit: 2016-09-02 | Discharge: 2016-09-02 | Disposition: A | Discharge status: Home / Self Care | Payer: Medicare Other | Source: Ambulatory Visit | Attending: Hematology & Oncology | Admitting: Hematology & Oncology

## 2016-09-02 DIAGNOSIS — C321 Malignant neoplasm of supraglottis: Secondary | ICD-10-CM | POA: Diagnosis not present

## 2016-09-02 MED ORDER — HEPARIN SOD (PORK) LOCK FLUSH 100 UNIT/ML IV SOLN
INTRAVENOUS | Status: AC
Start: 2016-09-02 — End: 2016-09-02
  Filled 2016-09-02: qty 5

## 2016-09-02 MED ORDER — HEPARIN SOD (PORK) LOCK FLUSH 100 UNIT/ML IV SOLN
500.0000 [IU] | Freq: Once | INTRAVENOUS | Status: AC
  Administered 2016-09-02: 500 [IU] via INTRAVENOUS

## 2016-09-02 MED ORDER — SODIUM CHLORIDE 0.9 % IV SOLN
INTRAVENOUS | Status: DC
  Administered 2016-09-02: 1000 mL via INTRAVENOUS

## 2016-09-05 ENCOUNTER — Encounter (HOSPITAL_COMMUNITY): Payer: Self-pay

## 2016-09-05 ENCOUNTER — Encounter (HOSPITAL_COMMUNITY): Payer: Medicare Other | Attending: Hematology & Oncology

## 2016-09-05 DIAGNOSIS — C321 Malignant neoplasm of supraglottis: Secondary | ICD-10-CM

## 2016-09-05 MED ORDER — SODIUM CHLORIDE 0.9 % IV SOLN
INTRAVENOUS | Status: DC
  Administered 2016-09-05: 11:00:00 via INTRAVENOUS

## 2016-09-05 MED ORDER — HEPARIN SOD (PORK) LOCK FLUSH 100 UNIT/ML IV SOLN
INTRAVENOUS | Status: AC
Start: 2016-09-05 — End: 2016-09-05
  Filled 2016-09-05: qty 5

## 2016-09-05 MED ORDER — HEPARIN SOD (PORK) LOCK FLUSH 100 UNIT/ML IV SOLN
500.0000 [IU] | Freq: Once | INTRAVENOUS | Status: AC
  Administered 2016-09-05: 500 [IU] via INTRAVENOUS

## 2016-09-05 NOTE — Patient Instructions (Signed)
[place] at [facility] Discharge Instructions  RECOMMENDATIONS MADE BY THE CONSULTANT AND ANY TEST RESULTS WILL BE SENT TO YOUR REFERRING PHYSICIAN.  1 liter of IVF given Follow up as scheduled.  Thank you for choosing [place] at [facility] to provide your oncology and hematology care.  To afford each patient quality time with our provider, please arrive at least 15 minutes before your scheduled appointment time.   Beginning January 23rd 2017 lab work for the [facility] will be done in the  Main lab at [facility] on 1st floor. If you have a lab appointment with the [place] please come in thru the  Main Entrance and check in at the main information desk  You need to re-schedule your appointment should you arrive 10 or more minutes late.  We strive to give you quality time with our providers, and arriving late affects you and other patients whose appointments are after yours.  Also, if you no show three or more times for appointments you may be dismissed from the clinic at the providers discretion.     Again, thank you for choosing [facility].  Our hope is that these requests will decrease the amount of time that you wait before being seen by our physicians.       _____________________________________________________________  Should you have questions after your visit to [facility], please contact our office at [phone] between the hours of 8:30 a.m. and 4:30 p.m.  Voicemails left after 4:30 p.m. will not be returned until the following business day.  For prescription refill requests, have your pharmacy contact our office.         Resources For Cancer Patients and their Caregivers ? American Cancer Society: Can assist with transportation, wigs, general needs, runs Look Good Feel Better.        306 875 4725 ? Cancer Care: Provides financial assistance, online support groups, medication/co-pay  assistance.  1-800-813-HOPE 470-354-1221) ? [place] Assists [place] cancer patients and their families through emotional , educational and financial support.  901-406-6514 ? [place] DSS Where to apply for food stamps, Medicaid and utility assistance. (806) 111-1247 ? RCATS: Transportation to medical appointments. 774-676-4638 ? Social Security Administration: May apply for disability if have a Stage IV cancer. 9195921633 204-193-8122 ? [employer], Disability and Transit Services: Assists with nutrition, care and transit needs. [place] Support Programs: @10RELATIVEDAYS @ > Cancer Support Group  2nd Tuesday of the month 1pm-2pm, Journey Room  > Creative Journey  3rd Tuesday of the month 1130am-1pm, Journey Room  > Look Good Feel Better  1st Wednesday of the month 10am-12 noon, Journey Room (Call [place] to register [phone])

## 2016-09-05 NOTE — Progress Notes (Signed)
Tolerated IVF without any problems. Discharged ambulatory with family by side. VSS. In stable condition.

## 2016-09-05 NOTE — Progress Notes (Signed)
One liter of IVF given today per orders. Patient tolerated them well.Vitals stable and discharged home with family. Follow up as scheduled.

## 2016-09-07 ENCOUNTER — Encounter (HOSPITAL_COMMUNITY)
Admission: RE | Admit: 2016-09-07 | Discharge: 2016-09-07 | Disposition: A | Discharge status: Home / Self Care | Payer: Medicare Other | Source: Ambulatory Visit | Attending: Hematology & Oncology | Admitting: Hematology & Oncology

## 2016-09-07 ENCOUNTER — Ambulatory Visit (HOSPITAL_COMMUNITY): Payer: Medicare Other

## 2016-09-07 DIAGNOSIS — C321 Malignant neoplasm of supraglottis: Secondary | ICD-10-CM | POA: Diagnosis not present

## 2016-09-07 MED ORDER — SODIUM CHLORIDE 0.9 % IV SOLN
INTRAVENOUS | Status: DC
  Administered 2016-09-07: 1000 mL via INTRAVENOUS

## 2016-09-07 MED ORDER — HEPARIN SOD (PORK) LOCK FLUSH 100 UNIT/ML IV SOLN
500.0000 [IU] | INTRAVENOUS | Status: AC | PRN
  Administered 2016-09-07: 500 [IU]
  Filled 2016-09-07: qty 5

## 2016-09-09 ENCOUNTER — Encounter: Payer: Self-pay | Admitting: Dietician

## 2016-09-09 ENCOUNTER — Encounter (HOSPITAL_BASED_OUTPATIENT_CLINIC_OR_DEPARTMENT_OTHER): Payer: Medicare Other

## 2016-09-09 VITALS — BP 143/70 | HR 69 | Temp 98.2°F | Resp 18 | Wt 151.0 lb

## 2016-09-09 DIAGNOSIS — C321 Malignant neoplasm of supraglottis: Secondary | ICD-10-CM

## 2016-09-09 MED ORDER — SODIUM CHLORIDE 0.9 % IV SOLN
Freq: Once | INTRAVENOUS | Status: AC
  Administered 2016-09-09: 12:00:00 via INTRAVENOUS

## 2016-09-09 MED ORDER — HEPARIN SOD (PORK) LOCK FLUSH 100 UNIT/ML IV SOLN
500.0000 [IU] | Freq: Once | INTRAVENOUS | Status: AC
  Administered 2016-09-09: 500 [IU] via INTRAVENOUS
  Filled 2016-09-09: qty 5

## 2016-09-09 MED ORDER — SODIUM CHLORIDE 0.9% FLUSH
10.0000 mL | INTRAVENOUS | Status: DC | PRN
  Administered 2016-09-09: 10 mL via INTRAVENOUS
  Filled 2016-09-09: qty 10

## 2016-09-09 NOTE — Progress Notes (Signed)
[name] tolerated IV fluids well without complaints or incident. Dietician in to see pt during therapy. VSS upon discharge. Pt discharged self ambulatory in satisfactory condition accompanied by family member

## 2016-09-09 NOTE — Progress Notes (Signed)
Follow up with Head and Neck patient who has just recently finished chemoradiation. First visit since completion  Contacted Pt by visiting during IVF infusion  Wt Readings from Last 10 Encounters:  09/09/16 151 lb (68.5 kg)  08/26/16 148 lb 12.8 oz (67.5 kg)  08/12/16 156 lb (70.8 kg)  08/03/16 156 lb 14.4 oz (71.2 kg)  07/22/16 163 lb 6.4 oz (74.1 kg)  07/12/16 166 lb 12.8 oz (75.7 kg)  07/01/16 167 lb 12.8 oz (76.1 kg)  06/23/16 168 lb (76.2 kg)  06/15/16 169 lb 4.8 oz (76.8 kg)  06/09/16 168 lb 6.4 oz (76.4 kg)   Patient was weighed at just over 154 lbs today, but 3 lbs were subtracted due to the very large boots he was wearing. He appears to have gained back some of the weight he had lost from past hospitalization. He has lost 20 lbs since beginning treatment  Patient reports that he has not resumed any oral intake yet. He is relying on his PEG for all nutrition.   Thankfully, he has increased his TF intake to 5 cans/day of his Osmolite 1.5. While still not at goal, this is much improved from the 3 cans he had been doing leading up to his AKI and hospitilization.   This is providing him with 1775 kcals (26 kcal/kg), 75 g Pro (1.1 g/kg bw), and 905 cc free water. He likely needs slightly more than this, though ideally he will start having PO intake soon.   He reports no trouble tolerating the tube feed. No n/v/D or abdominal pain after infusion.  In fact, he says he still has significant constipation. Reports having a BM every 3 days and his last was 2 days ago. Encouraged patient to use the mag citrate he has used in past.   Went over the process the weaning the patients tube feeding now that he is s/p treatment. Emphasized patience, as it may take longer than he would like before his tube can be removed. When he feels comfortable, asked him to start taking bites and sips of soft foods/thicker drinks. RD will follow up in a week or so and go more over specific foods that he should  prioritize to reduce his dependence on TF.   Pt does not have any issues at this time and says overall he feels well.   [name] RD, LDN, [place] [facility] Pager: [phone] 09/09/2016 12:46 PM

## 2016-09-09 NOTE — Patient Instructions (Signed)
[place] at [facility] Discharge Instructions  RECOMMENDATIONS MADE BY THE CONSULTANT AND ANY TEST RESULTS WILL BE SENT TO YOUR REFERRING PHYSICIAN.  Received IV fluids over 2 hrs today. Follow-up as scheduled. Call clinic for any questions or concerns  Thank you for choosing [place] at [facility] to provide your oncology and hematology care.  To afford each patient quality time with our provider, please arrive at least 15 minutes before your scheduled appointment time.   Beginning January 23rd 2017 lab work for the [facility] will be done in the  Main lab at [facility] on 1st floor. If you have a lab appointment with the [place] please come in thru the  Main Entrance and check in at the main information desk  You need to re-schedule your appointment should you arrive 10 or more minutes late.  We strive to give you quality time with our providers, and arriving late affects you and other patients whose appointments are after yours.  Also, if you no show three or more times for appointments you may be dismissed from the clinic at the providers discretion.     Again, thank you for choosing [facility].  Our hope is that these requests will decrease the amount of time that you wait before being seen by our physicians.       _____________________________________________________________  Should you have questions after your visit to [facility], please contact our office at [phone] between the hours of 8:30 a.m. and 4:30 p.m.  Voicemails left after 4:30 p.m. will not be returned until the following business day.  For prescription refill requests, have your pharmacy contact our office.         Resources For Cancer Patients and their Caregivers ? American Cancer Society: Can assist with transportation, wigs, general needs, runs Look Good Feel Better.        7327935185 ? Cancer Care: Provides  financial assistance, online support groups, medication/co-pay assistance.  1-800-813-HOPE 930-556-5423) ? [place] Assists [place] cancer patients and their families through emotional , educational and financial support.  (365)134-3473 ? [place] DSS Where to apply for food stamps, Medicaid and utility assistance. 423-376-5217 ? RCATS: Transportation to medical appointments. 838-834-4423 ? Social Security Administration: May apply for disability if have a Stage IV cancer. ([phone] ? [employer], Disability and Transit Services: Assists with nutrition, care and transit needs. [place] Support Programs: @10RELATIVEDAYS @ > Cancer Support Group  2nd Tuesday of the month 1pm-2pm, Journey Room  > Creative Journey  3rd Tuesday of the month 1130am-1pm, Journey Room  > Look Good Feel Better  1st Wednesday of the month 10am-12 noon, Journey Room (Call [place] to register [phone])

## 2016-09-12 ENCOUNTER — Encounter (HOSPITAL_BASED_OUTPATIENT_CLINIC_OR_DEPARTMENT_OTHER): Payer: Medicare Other

## 2016-09-12 ENCOUNTER — Encounter (HOSPITAL_COMMUNITY): Payer: Self-pay

## 2016-09-12 DIAGNOSIS — C321 Malignant neoplasm of supraglottis: Secondary | ICD-10-CM | POA: Diagnosis not present

## 2016-09-12 MED ORDER — SODIUM CHLORIDE 0.9 % IV SOLN
INTRAVENOUS | Status: DC
  Administered 2016-09-12: 12:00:00 via INTRAVENOUS

## 2016-09-12 MED ORDER — HEPARIN SOD (PORK) LOCK FLUSH 100 UNIT/ML IV SOLN
INTRAVENOUS | Status: AC
Start: 2016-09-12 — End: 2016-09-12
  Filled 2016-09-12: qty 5

## 2016-09-12 MED ORDER — HEPARIN SOD (PORK) LOCK FLUSH 100 UNIT/ML IV SOLN
500.0000 [IU] | Freq: Once | INTRAVENOUS | Status: AC
  Administered 2016-09-12: 500 [IU] via INTRAVENOUS

## 2016-09-12 NOTE — Patient Instructions (Signed)
[place] at [facility] Discharge Instructions  RECOMMENDATIONS MADE BY THE CONSULTANT AND ANY TEST RESULTS WILL BE SENT TO YOUR REFERRING PHYSICIAN.  One liter of fluids given today. Follow up as scheduled.  Thank you for choosing [place] at [facility] to provide your oncology and hematology care.  To afford each patient quality time with our provider, please arrive at least 15 minutes before your scheduled appointment time.   Beginning January 23rd 2017 lab work for the [facility] will be done in the  Main lab at [facility] on 1st floor. If you have a lab appointment with the [place] please come in thru the  Main Entrance and check in at the main information desk  You need to re-schedule your appointment should you arrive 10 or more minutes late.  We strive to give you quality time with our providers, and arriving late affects you and other patients whose appointments are after yours.  Also, if you no show three or more times for appointments you may be dismissed from the clinic at the providers discretion.     Again, thank you for choosing [facility].  Our hope is that these requests will decrease the amount of time that you wait before being seen by our physicians.       _____________________________________________________________  Should you have questions after your visit to [facility], please contact our office at [phone] between the hours of 8:30 a.m. and 4:30 p.m.  Voicemails left after 4:30 p.m. will not be returned until the following business day.  For prescription refill requests, have your pharmacy contact our office.         Resources For Cancer Patients and their Caregivers ? American Cancer Society: Can assist with transportation, wigs, general needs, runs Look Good Feel Better.        [phone] ? Cancer Care: Provides financial assistance, online support groups,  medication/co-pay assistance.  1-800-813-HOPE (469) 530-7530) ? [place] Assists [place] cancer patients and their families through emotional , educational and financial support.  [phone] ? [place] DSS Where to apply for food stamps, Medicaid and utility assistance. 617-388-0433 ? RCATS: Transportation to medical appointments. 365-601-4663 ? Social Security Administration: May apply for disability if have a Stage IV cancer. 8150658281 4780756778 ? [employer], Disability and Transit Services: Assists with nutrition, care and transit needs. [place] Support Programs: @10RELATIVEDAYS @ > Cancer Support Group  2nd Tuesday of the month 1pm-2pm, Journey Room  > Creative Journey  3rd Tuesday of the month 1130am-1pm, Journey Room  > Look Good Feel Better  1st Wednesday of the month 10am-12 noon, Journey Room (Call [place] to register [phone])

## 2016-09-12 NOTE — Progress Notes (Signed)
1 liter of IVF given today per orders. Patient tolerated it well. No issues. Vitals stable, discharged from clinic ambulatory with family. Follow up as scheduled.

## 2016-09-13 ENCOUNTER — Other Ambulatory Visit (HOSPITAL_COMMUNITY): Payer: Self-pay | Admitting: Oncology

## 2016-09-14 ENCOUNTER — Encounter (HOSPITAL_COMMUNITY)
Admission: RE | Admit: 2016-09-14 | Discharge: 2016-09-14 | Disposition: A | Discharge status: Home / Self Care | Payer: Medicare Other | Source: Ambulatory Visit | Attending: Hematology & Oncology | Admitting: Hematology & Oncology

## 2016-09-14 ENCOUNTER — Other Ambulatory Visit (HOSPITAL_COMMUNITY): Payer: Self-pay | Admitting: Emergency Medicine

## 2016-09-14 DIAGNOSIS — C321 Malignant neoplasm of supraglottis: Secondary | ICD-10-CM | POA: Diagnosis not present

## 2016-09-14 MED ORDER — HEPARIN SOD (PORK) LOCK FLUSH 100 UNIT/ML IV SOLN
500.0000 [IU] | Freq: Once | INTRAVENOUS | Status: AC
  Administered 2016-09-14: 500 [IU] via INTRAVENOUS

## 2016-09-14 MED ORDER — HEPARIN SOD (PORK) LOCK FLUSH 100 UNIT/ML IV SOLN
INTRAVENOUS | Status: AC
  Filled 2016-09-14: qty 5

## 2016-09-14 MED ORDER — SODIUM CHLORIDE 0.9 % IV SOLN
INTRAVENOUS | Status: DC
  Administered 2016-09-14: 1000 mL via INTRAVENOUS

## 2016-09-16 ENCOUNTER — Encounter: Payer: Self-pay | Admitting: Dietician

## 2016-09-16 ENCOUNTER — Encounter (HOSPITAL_BASED_OUTPATIENT_CLINIC_OR_DEPARTMENT_OTHER): Payer: Medicare Other

## 2016-09-16 VITALS — BP 114/63 | HR 104 | Temp 98.3°F | Resp 18 | Wt 152.6 lb

## 2016-09-16 DIAGNOSIS — C321 Malignant neoplasm of supraglottis: Secondary | ICD-10-CM | POA: Diagnosis not present

## 2016-09-16 MED ORDER — SODIUM CHLORIDE 0.9% FLUSH
10.0000 mL | INTRAVENOUS | Status: DC | PRN
  Administered 2016-09-16: 10 mL via INTRAVENOUS
  Filled 2016-09-16: qty 10

## 2016-09-16 MED ORDER — HEPARIN SOD (PORK) LOCK FLUSH 100 UNIT/ML IV SOLN
500.0000 [IU] | Freq: Once | INTRAVENOUS | Status: AC
  Administered 2016-09-16: 500 [IU] via INTRAVENOUS
  Filled 2016-09-16: qty 5

## 2016-09-16 MED ORDER — SODIUM CHLORIDE 0.9 % IV SOLN
Freq: Once | INTRAVENOUS | Status: AC
  Administered 2016-09-16: 11:00:00 via INTRAVENOUS

## 2016-09-16 NOTE — Progress Notes (Signed)
Follow up with Head and Neck patient s/p chemoradiation.  Contacted Pt by visiting during IVF infusion. Daughter was also present  Wt Readings from Last 10 Encounters:  09/16/16 152 lb 9.6 oz (69.2 kg)  09/12/16 151 lb 12.8 oz (68.9 kg)  09/09/16 151 lb (68.5 kg)  08/26/16 148 lb 12.8 oz (67.5 kg)  08/12/16 156 lb (70.8 kg)  08/03/16 156 lb 14.4 oz (71.2 kg)  07/22/16 163 lb 6.4 oz (74.1 kg)  07/12/16 166 lb 12.8 oz (75.7 kg)  07/01/16 167 lb 12.8 oz (76.1 kg)  06/23/16 168 lb (76.2 kg)   Patient weight has is currently stable  Today, Patient reports that he still has largely not started to eat again. However, he took bites of sweet potatoes yesterday and apparently did well with them. He also consumed an Ensure Plus this morning.   Pt says he is "felling hungry" and he does have some desire for food. His taste is coming back as he was able to taste the potatoes yesterday. Denies any trouble swallowing right now. He says he doesn't chew anything right now.   Per daughter, pt has SLP follow up on 1/2. He has not been doing any type of ST exercises. RD asked if he had received any handouts and he said he wasn't sure.   Tube feeding wise, pt has self weaned to 3 cans a day. The Ensure Plus is providing essentially the same amount of Kcals, pro as a can of Osmolite 1.5 so he is told for each one of these he drinks, he can take away a can of TF.   Today is the pt's final IVF infusion. Daughter is slightly worried about not having the IVF support and pt will become severely dehydrated as he did in the past.   Went through daily fluid intake.   Per daughter, she will infuse 2 of the 3 cans a day and the other daughter will infuse 1 can. They are able to get 300 cc of free water in with each can of tube feeding. He is getting 543 cc from the innate free water from his 3 cans TF.  Additionally, the pt says he is able to infuse two 16 oz bottles of water in between feedings. From these sources  alone, pt is receiving 2.4 liters. This is not including his PO intake. Told pt and daughter that with their reported fluid intake, he is receiving enough hydration.   Encouraged pt to continue to try to eat softer foods that require little chewing. He is given a handout on soft/moist foods that provide relatively large amounts of protein. Encouraged him to try Pie at [facility].   Pt's voice is stronger today and the pt says he does not have many complaints. He feels pretty good.   Will continue to follow patient and follow up after the holidays.   Left and handouts titled "Soft and Moist High Protein Menu Idea".  [name] RD, LDN, [place] [facility] Pager: [phone] 09/16/2016 1:11 PM

## 2016-09-16 NOTE — Patient Instructions (Signed)
[place] at [facility] Discharge Instructions  RECOMMENDATIONS MADE BY THE CONSULTANT AND ANY TEST RESULTS WILL BE SENT TO YOUR REFERRING PHYSICIAN.  Received 2hr hydration fluids today. Follow-up as scheduled. Call clinic for any questions or concerns  Thank you for choosing [place] at [facility] to provide your oncology and hematology care.  To afford each patient quality time with our provider, please arrive at least 15 minutes before your scheduled appointment time.   Beginning January 23rd 2017 lab work for the [facility] will be done in the  Main lab at [facility] on 1st floor. If you have a lab appointment with the [facility] please come in thru the  Main Entrance and check in at the main information desk  You need to re-schedule your appointment should you arrive 10 or more minutes late.  We strive to give you quality time with our providers, and arriving late affects you and other patients whose appointments are after yours.  Also, if you no show three or more times for appointments you may be dismissed from the clinic at the providers discretion.     Again, thank you for choosing [facility].  Our hope is that these requests will decrease the amount of time that you wait before being seen by our physicians.       _____________________________________________________________  Should you have questions after your visit to [facility], please contact our office at [phone] between the hours of 8:30 a.m. and 4:30 p.m.  Voicemails left after 4:30 p.m. will not be returned until the following business day.  For prescription refill requests, have your pharmacy contact our office.         Resources For Cancer Patients and their Caregivers ? American Cancer Society: Can assist with transportation, wigs, general needs, runs Look Good Feel Better.        214-451-1256 ? Cancer Care: Provides  financial assistance, online support groups, medication/co-pay assistance.  1-800-813-HOPE 253-817-7550) ? [place] Assists [place] cancer patients and their families through emotional , educational and financial support.  534-090-6545 ? [place] DSS Where to apply for food stamps, Medicaid and utility assistance. (773)130-3115 ? RCATS: Transportation to medical appointments. 661-293-2131 ? Social Security Administration: May apply for disability if have a Stage IV cancer. 848-155-7309 920-278-3541 ? [employer], Disability and Transit Services: Assists with nutrition, care and transit needs. [place] Support Programs: @10RELATIVEDAYS @ > Cancer Support Group  2nd Tuesday of the month 1pm-2pm, Journey Room  > Creative Journey  3rd Tuesday of the month 1130am-1pm, Journey Room  > Look Good Feel Better  1st Wednesday of the month 10am-12 noon, Journey Room (Call [place] to register [phone])

## 2016-09-16 NOTE — Progress Notes (Signed)
Bruce [name] tolerated hydration well without complaints or incident. VSS Dietician in to see pt. Pt discharged self ambulatory in satisfactory condition accompanied by his daughter

## 2016-09-19 ENCOUNTER — Encounter (HOSPITAL_COMMUNITY): Payer: Self-pay | Admitting: Hematology & Oncology

## 2016-09-21 ENCOUNTER — Encounter (HOSPITAL_COMMUNITY): Payer: Self-pay | Admitting: Hematology & Oncology

## 2016-09-21 ENCOUNTER — Encounter (HOSPITAL_BASED_OUTPATIENT_CLINIC_OR_DEPARTMENT_OTHER): Payer: Medicare Other | Admitting: Hematology & Oncology

## 2016-09-21 ENCOUNTER — Encounter (HOSPITAL_COMMUNITY): Payer: Medicare Other

## 2016-09-21 VITALS — BP 129/67 | HR 103 | Temp 98.5°F | Resp 16 | Wt 148.9 lb

## 2016-09-21 DIAGNOSIS — E876 Hypokalemia: Secondary | ICD-10-CM | POA: Diagnosis not present

## 2016-09-21 DIAGNOSIS — R634 Abnormal weight loss: Secondary | ICD-10-CM

## 2016-09-21 DIAGNOSIS — Z720 Tobacco use: Secondary | ICD-10-CM

## 2016-09-21 DIAGNOSIS — C321 Malignant neoplasm of supraglottis: Secondary | ICD-10-CM

## 2016-09-21 DIAGNOSIS — I739 Peripheral vascular disease, unspecified: Secondary | ICD-10-CM

## 2016-09-21 DIAGNOSIS — G893 Neoplasm related pain (acute) (chronic): Secondary | ICD-10-CM | POA: Diagnosis not present

## 2016-09-21 DIAGNOSIS — K5900 Constipation, unspecified: Secondary | ICD-10-CM

## 2016-09-21 DIAGNOSIS — C770 Secondary and unspecified malignant neoplasm of lymph nodes of head, face and neck: Secondary | ICD-10-CM

## 2016-09-21 LAB — CBC WITH DIFFERENTIAL/PLATELET
BASOS PCT: 1 %
Basophils Absolute: 0.1 10*3/uL (ref 0.0–0.1)
Eosinophils Absolute: 0.4 10*3/uL (ref 0.0–0.7)
Eosinophils Relative: 3 %
HEMATOCRIT: 35.9 % — AB (ref 39.0–52.0)
HEMOGLOBIN: 11.6 g/dL — AB (ref 13.0–17.0)
LYMPHS ABS: 1.9 10*3/uL (ref 0.7–4.0)
LYMPHS PCT: 17 %
MCH: 29.8 pg (ref 26.0–34.0)
MCHC: 32.3 g/dL (ref 30.0–36.0)
MCV: 92.3 fL (ref 78.0–100.0)
MONOS PCT: 8 %
Monocytes Absolute: 0.9 10*3/uL (ref 0.1–1.0)
NEUTROS ABS: 8.3 10*3/uL — AB (ref 1.7–7.7)
NEUTROS PCT: 71 %
Platelets: 220 10*3/uL (ref 150–400)
RBC: 3.89 MIL/uL — ABNORMAL LOW (ref 4.22–5.81)
RDW: 19.1 % — ABNORMAL HIGH (ref 11.5–15.5)
WBC: 11.5 10*3/uL — ABNORMAL HIGH (ref 4.0–10.5)

## 2016-09-21 LAB — COMPREHENSIVE METABOLIC PANEL
ALBUMIN: 3.5 g/dL (ref 3.5–5.0)
ALK PHOS: 78 U/L (ref 38–126)
ALT: 13 U/L — ABNORMAL LOW (ref 17–63)
ANION GAP: 10 (ref 5–15)
AST: 19 U/L (ref 15–41)
BILIRUBIN TOTAL: 0.6 mg/dL (ref 0.3–1.2)
BUN: 18 mg/dL (ref 6–20)
CALCIUM: 10 mg/dL (ref 8.9–10.3)
CO2: 26 mmol/L (ref 22–32)
Chloride: 95 mmol/L — ABNORMAL LOW (ref 101–111)
Creatinine, Ser: 1.32 mg/dL — ABNORMAL HIGH (ref 0.61–1.24)
GFR calc Af Amer: >60 mL/min (ref >60)
GFR calc non Af Amer: 53 mL/min — ABNORMAL LOW (ref >60)
GLUCOSE: 107 mg/dL — AB (ref 65–99)
Potassium: 4.4 mmol/L (ref 3.5–5.1)
Sodium: 131 mmol/L — ABNORMAL LOW (ref 135–145)
TOTAL PROTEIN: 7.7 g/dL (ref 6.5–8.1)

## 2016-09-21 MED ORDER — FENTANYL 25 MCG/HR TD PT72: 25.0000 ug | MEDICATED_PATCH | TRANSDERMAL | 0 refills | Status: AC

## 2016-09-21 MED ORDER — MAGIC MOUTHWASH W/LIDOCAINE: 10.0000 mL | Freq: Four times a day (QID) | ORAL | 2 refills | Status: DC | PRN

## 2016-09-21 NOTE — Patient Instructions (Addendum)
[place] at [facility] Discharge Instructions  RECOMMENDATIONS MADE BY THE CONSULTANT AND ANY TEST RESULTS WILL BE SENT TO YOUR REFERRING PHYSICIAN.  You were seen today by Dr. Whitney Muse. Rx for Fentanyl 80mcg change every 2 days, and for magic mouthwash. Labs today, will call with results. Return to clinic in 2 weeks.   Thank you for choosing [place] at [facility] to provide your oncology and hematology care.  To afford each patient quality time with our provider, please arrive at least 15 minutes before your scheduled appointment time.   Beginning January 23rd 2017 lab work for the [facility] will be done in the  Main lab at [facility] on 1st floor. If you have a lab appointment with the [facility] please come in thru the  Main Entrance and check in at the main information desk  You need to re-schedule your appointment should you arrive 10 or more minutes late.  We strive to give you quality time with our providers, and arriving late affects you and other patients whose appointments are after yours.  Also, if you no show three or more times for appointments you may be dismissed from the clinic at the providers discretion.     Again, thank you for choosing [facility].  Our hope is that these requests will decrease the amount of time that you wait before being seen by our physicians.       _____________________________________________________________  Should you have questions after your visit to [facility], please contact our office at [phone] between the hours of 8:30 a.m. and 4:30 p.m.  Voicemails left after 4:30 p.m. will not be returned until the following business day.  For prescription refill requests, have your pharmacy contact our office.         Resources For Cancer Patients and their Caregivers ? American Cancer Society: Can assist with transportation, wigs, general needs, runs Look  Good Feel Better.        (636)146-5930 ? Cancer Care: Provides financial assistance, online support groups, medication/co-pay assistance.  1-800-813-HOPE 431-372-9773) ? [place] Assists [place] cancer patients and their families through emotional , educational and financial support.  403-806-2368 ? [place] DSS Where to apply for food stamps, Medicaid and utility assistance. (408)678-2112 ? RCATS: Transportation to medical appointments. 878-736-6137 ? Social Security Administration: May apply for disability if have a Stage IV cancer. 817-348-0738 803-762-7223 ? [employer], Disability and Transit Services: Assists with nutrition, care and transit needs. [place] Support Programs: @10RELATIVEDAYS @ > Cancer Support Group  2nd Tuesday of the month 1pm-2pm, Journey Room  > Creative Journey  3rd Tuesday of the month 1130am-1pm, Journey Room  > Look Good Feel Better  1st Wednesday of the month 10am-12 noon, Journey Room (Call [place] to register [phone])

## 2016-09-21 NOTE — Progress Notes (Signed)
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 Benjamine Mola      05/18/2016 Pathology Results    Diagnosis 1. Larynx, biopsy, Right aryepiglottic - INVASIVE POORLY DIFFERENTIATED CARCINOMA. - SEE COMMENT. 2. Larynx, biopsy, Left Aryepiglottic Fold - INVASIVE POORLY      06/14/2016 PET scan    1. Left-sided primary centered at the piriform sinus and aryepiglottic fold. 2. Bilateral cervical nodal metastasis. 3. No extracervical metastatic disease identified. 4. Bilateral pulmonary hypermetabolism is suspicious for developing interstitial lung disease (likely nonspecific interstitial pneumonia) superimposed upon centrilobular and paraseptal emphysema. Infectious process is felt less likely. 5. Hypermetabolic focus in the left side of the prostate is nonspecific. Consider correlation with PSA level and physical exam. 6. Right latissimus muscular hypermetabolism is likely incidental but warrants followup attention.      06/27/2016 Procedure     Port-A-Cath insertion, EGD with PEG placement by Dr. Arnoldo Morale      07/01/2016 - 08/12/2016 Chemotherapy    The patient had palonosetron (ALOXI) injection 0.25 mg, 0.25 mg, Intravenous,  Once, 3 of 3 cycles  CISplatin (PLATINOL) 191 mg in sodium chloride 0.9 % 500 mL chemo infusion, 100 mg/m2 = 191 mg, Intravenous,  Once, 3 of 3 cycles  fosaprepitant (EMEND) 150 mg, dexamethasone (DECADRON) 12 mg in sodium chloride 0.9 % 145 mL IVPB, , Intravenous,  Once, 3 of 3 cycles  for chemotherapy treatment.        07/01/2016 -  Radiation Therapy         08/21/2016 -  Hospital Admission    Admit date: 08/21/2016 Admission  diagnosis: AKI Additional comments: Reported to ED with chest pain.       HISTORY OF PRESENTING ILLNESS:  Bruce [name] 70 y.o. male is here for a follow up of bilateral laryngeal carcinoma. He has certainly had challenges throughout therapy. Currently has problems with recovery. Noting throat pain and swallowing as major issues. He continues to smoke.   Mr. Bihm returns to the [facility] today accompanied by his daughter.   He has lost about 4 lbs since 09/16/16. He reports some trouble swallowing food. He denies choking while swallowing. Sometimes a particle of food will get caught and he will cough it back it up. He denies trouble swallowing liquids. He is using his feeding tube and taking in Osmolite cans, about 3 to 5 depending on how much he is eating. If he takes in more Osmolite then he doesn't have an appetite to eat food orally. Unfortunately the food he eats is not really calorie dense consisting of pudding and applesauce. Taste is coming back and is not a major complaint.   He continues to smoke cigars heavily.  He reports sore throat pain. He cannot tell if the fentanyl patch makes any difference in his pain. The Magic Mouthwash helps with his pain. Liquid hydrocodone helps manage his pain.    He sleeps well at night. His daughter admits he is quiet, but that he only gets 'crabby' when he is tired.  He will see Alexis Goodell again for speech therapy on January 2nd. His daughter states he is scheduled for follow up with radiation oncology on February 27th. He saw them  yesterday. No formal imaging has been scheduled.     MEDICAL HISTORY:  Past Medical History:  Diagnosis Date  . AAA (abdominal aortic aneurysm) ([place])   . Anxiety   . Cancer (HCC)    laryngeal  . 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 Carpal tunnel syndrome   . Cellulitis   . COPD (chronic obstructive pulmonary disease) ([place])   . Epigastric hernia   . GERD  (gastroesophageal reflux disease)   . Hyperlipidemia   . Hypertension     used to take amlodipine, none now    SURGICAL HISTORY: Past Surgical History:  Procedure Laterality Date  . APPENDECTOMY    . DIRECT LARYNGOSCOPY N/A 05/16/2016   Procedure: MICRO DIRECT LARYNGOSCOPY WITH BIOPSY;  Surgeon: Leta Baptist, MD;  Location: [facility];  Service: ENT;  Laterality: N/A;  . ESOPHAGOGASTRODUODENOSCOPY N/A 06/27/2016   Procedure: ESOPHAGOGASTRODUODENOSCOPY (EGD);  Surgeon: Aviva Signs, MD;  Location: [facility];  Service: General;  Laterality: N/A;  . HEMORROIDECTOMY    . IR GENERIC HISTORICAL  07/14/2016   IR RADIOLOGIST EVAL & MGMT [facility]-INTER[facility] RAD  . IR GENERIC HISTORICAL  07/14/2016   IR PATIENT EVAL TECH 0-60 MINS [facility]-INTERV RAD  . PEG PLACEMENT N/A 06/27/2016   Procedure: PERCUTANEOUS ENDOSCOPIC GASTROSTOMY (PEG) PLACEMENT;  Surgeon: Aviva Signs, MD;  Location: [facility];  Service: General;  Laterality: N/A;  . PORTACATH PLACEMENT Left 06/27/2016   Procedure: INSERTION PORT-A-CATH;  Surgeon: Aviva Signs, MD;  Location: [facility];  Service: General;  Laterality: Left;  Marland Kitchen VASCULAR SURGERY Right 2008   stent to right common iliac artery    SOCIAL HISTORY: Social History   Social History  . Marital status: Widowed    Spouse name: N/A  . Number of children: N/A  . Years of education: N/A   Occupational History  . Not on file.   Social History Main Topics  . Smoking status: Current Every Day Smoker    Packs/day: 0.00    Years: 12.00    Types: Cigars  . Smokeless tobacco: Never Used     Comment: 5-7 cigars daily  . Alcohol use No  . Drug use: No  . Sexual activity: Not Currently   Other Topics Concern  . Not on file   Social History Narrative  . No narrative on file  Patient lives alone.  He has 2 daughters; one lives close to him and one lives in [place]. He has four grandchildren.  He smokes about 5 cigars day. He used to smoke cigarettes and chew tobacco when he  was young. 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  FAMILY HISTORY: Family History  Problem Relation Age of Onset  . Heart failure Mother   . Coronary artery disease Father   . Stroke Father   . Alcoholism Father   . Heart disease Brother     MI  4 sisters; brother died from massive heart attack  Mother deceased at 60  Father deceased at 56 from massive heart attack.  ALLERGIES:  has No Known Allergies.  MEDICATIONS:  Current Outpatient Prescriptions  Medication Sig Dispense Refill  . albuterol (PROVENTIL HFA;VENTOLIN HFA) 108 (90 Base) MCG/ACT inhaler Inhale 1 puff into the lungs every 6 (six) hours as needed for wheezing or shortness of breath.    Marland Kitchen atorvastatin (LIPITOR) 10 MG tablet Take 10 mg by mouth daily.  12  . Choline Fenofibrate (TRILIPIX) 135  MG capsule Take 135 mg by mouth daily.     Marland Kitchen CISPLATIN IV Inject into the vein.    Marland Kitchen dexamethasone (DECADRON) 4 MG tablet Take 2 tablets by mouth once a day on the day after chemotherapy and then take 2 tablets two times a day for 2 days. Take with food. 30 tablet 1  . fentaNYL (DURAGESIC - DOSED MCG/HR) 25 MCG/HR patch Place 1 patch (25 mcg total) onto the skin every 3 (three) days. 10 patch 0  . HYDROcodone-acetaminophen (HYCET) 7.5-325 mg/15 ml solution Take 15 mLs by mouth every 4 (four) hours as needed for moderate pain. 473 mL 0  . HYDROcodone-acetaminophen (NORCO) 10-325 MG tablet TAKE 1 TABLET BY MOUTH EVERY FOUR HOURS AS NEEDED FOR MODERATE PAIN 60 tablet 0  . LORazepam (ATIVAN) 1 MG tablet Take 1 mg by mouth at bedtime.     . magic mouthwash w/lidocaine SOLN Swish and swallow 73m four times a day for mouth pain/soreness. (Patient taking differently: Take 10 mLs by mouth 4 (four) times daily as needed for mouth pain. Swish and swallow 528mfour times a day for mouth pain/soreness.) 360 mL 1  . ondansetron (ZOFRAN) 8 MG tablet     . pantoprazole (PROTONIX) 40 MG tablet Take 40 mg by  mouth 2 (two) times daily as needed.   12  . potassium chloride 20 MEQ/15ML (10%) SOLN Take 30 mLs (40 mEq total) by mouth daily. 450 mL 0  . prochlorperazine (COMPAZINE) 10 MG tablet Take 1 tablet (10 mg total) by mouth every 6 (six) hours as needed for nausea or vomiting. 30 tablet 2  . traMADol (ULTRAM) 50 MG tablet      Current Facility-Administered Medications  Medication Dose Route Frequency Provider Last Rate Last Dose  . feeding supplement (OSMOLITE 1.5 CAL) liquid 237 mL  237 mL Per Tube Q24H ShPatrici RanksMD        Review of Systems  Constitutional: Negative.   HENT: Positive for sore throat.        Dysphagia with food Throat pain  Eyes: Negative.   Cardiovascular: Negative.   Gastrointestinal: Negative for nausea and vomiting.  Genitourinary: Negative.   Musculoskeletal: Negative.   Skin: Negative.   Neurological: Negative.   Endo/Heme/Allergies: Negative.   Psychiatric/Behavioral: Negative.           All other systems reviewed and are negative. 14 point ROS was done and is otherwise as detailed above or in HPI   PHYSICAL EXAMINATION: ECOG PERFORMANCE STATUS: 1 - Symptomatic but completely ambulatory  Vitals:   09/21/16 0818  BP: 129/67  Pulse: (!) 103  Resp: 16  Temp: 98.5 F (36.9 C)   Filed Weights   09/21/16 0818  Weight: 148 lb 14.4 oz (67.5 kg)     Physical Exam  Constitutional: He is oriented to person, place, and time and well-developed, well-nourished, and in no distress.  Obvious weight loss  HENT:  Head: Normocephalic and atraumatic.  Nose: Nose normal.  Mouth/Throat: No oropharyngeal exudate.  Decent saliva production  Eyes: Conjunctivae and EOM are normal. Pupils are equal, round, and reactive to light. Right eye exhibits no discharge. Left eye exhibits no discharge. No scleral icterus.  Neck: Normal range of motion. Neck supple. No tracheal deviation present. No thyromegaly present.  Mild Radiation effects on bilateral neck    Cardiovascular: Normal rate, regular rhythm and normal heart sounds.  Exam reveals no gallop and no friction rub.   No murmur heard. Pulmonary/Chest: Effort  normal and breath sounds normal. He has no wheezes. He has no rales.  Abdominal: Soft. Bowel sounds are normal. He exhibits no distension and no mass. There is no tenderness. There is no rebound and no guarding.  FT site C/D/I  Musculoskeletal: Normal range of motion. He exhibits no edema.  Lymphadenopathy:    He has no cervical adenopathy.    He has no axillary adenopathy.  Neurological: He is alert and oriented to person, place, and time. He has normal reflexes. No cranial nerve deficit. Gait normal. Coordination normal.  Skin: Skin is warm and dry. No rash noted.  Psychiatric: Mood, memory, affect and judgment normal.  Nursing note and vitals reviewed.  LABORATORY DATA:  I have reviewed the data as listed Results for Bruce, [name] (MRN [id number]) as of 09/21/2016 09:01  Ref. Range 08/26/2016 04:30 08/26/2016 07:51  Glucose-Capillary Latest Ref Range: 65 - 99 mg/dL  86  Sodium Latest Ref Range: 135 - 145 mmol/L 140   Potassium Latest Ref Range: 3.5 - 5.1 mmol/L 3.3 (L)   Chloride Latest Ref Range: 101 - 111 mmol/L 104   CO2 Latest Ref Range: 22 - 32 mmol/L 28   BUN Latest Ref Range: 6 - 20 mg/dL 27 (H)   Creatinine Latest Ref Range: 0.61 - 1.24 mg/dL 1.38 (H)   Calcium Latest Ref Range: 8.9 - 10.3 mg/dL 7.8 (L)   EGFR (Non-African Amer.) Latest Ref Range: >60 mL/min 50 (L)   EGFR (African American) Latest Ref Range: >60 mL/min 58 (L)   Glucose Latest Ref Range: 65 - 99 mg/dL 92   Anion gap Latest Ref Range: 5 - 15  8   WBC Latest Ref Range: 4.0 - 10.5 K/uL 2.1 (L)   RBC Latest Ref Range: 4.22 - 5.81 MIL/uL 3.37 (L)   Hemoglobin Latest Ref Range: 13.0 - 17.0 g/dL 9.7 (L)   HCT Latest Ref Range: 39.0 - 52.0 % 29.9 (L)   MCV Latest Ref Range: 78.0 - 100.0 fL 88.7   MCH Latest Ref Range: 26.0 - 34.0 pg 28.8   MCHC Latest Ref  Range: 30.0 - 36.0 g/dL 32.4   RDW Latest Ref Range: 11.5 - 15.5 % 15.4   Platelets Latest Ref Range: 150 - 400 K/uL 90 (L)     RADIOGRAPHIC STUDIES: I have personally reviewed the radiological images as listed and agreed with the findings in the report. No results found. Study Result   CLINICAL DATA:  Fever  EXAM: CHEST  2 VIEW  COMPARISON:  08/21/2016  FINDINGS: Left Port-A-Cath remains in place, unchanged. Interstitial prominence throughout the lungs, stable. Heart is normal size. Tortuosity of the thoracic aorta. No effusions. No acute bony abnormality.  IMPRESSION: Stable interstitial prominence.   Electronically Signed   By: Rolm Baptise M.D.   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 Based on these results, we may order ongoing IV fluids. I am concerned that he still is not getting enough fluid intake. He has been admitted with dehydration and AKI.  He has lost about 4 lbs since 09/16/16. I encouraged the patient to eat soft calorie dense foods if he chooses to eat food orally versus an Osmolite can via TF. We reviewed some options. He has not been doing  his ST exercises. He has SLP follow-up on 1/2. I emphasized that if he wants his swallowing to improve he must work with all of his providers throughout his healing process.     I advised the patient he can change his fentanyl patch every 2 days. I encouraged him to use liquid hydrocodone for break through pain. I refilled his Magic Mouthwash.  He continues to smoke cigars heavily. I spoke with the patient about smoking cessation. This smoking is likely worsening his throat irritation symptoms.   He is scheduled to see speech therapist Genene Churn  again on 09/27/16.  He will return for follow up in 2 weeks.   Orders Placed This Encounter  Procedures  . CBC with Differential    Standing Status:   Standing    Number of Occurrences:   5    Standing Expiration Date:   09/21/2017  . Comprehensive metabolic panel    Standing Status:   Standing    Number of Occurrences:   5    Standing Expiration Date:   09/21/2017   They will be notified of lab results when available.   This document serves as a record of services personally performed by Ancil Linsey, MD. It was created on her behalf by Arlyce Harman, a trained medical scribe. The creation of this record is based on the scribe's personal observations and the provider's statements to them. This document has been checked and approved by the attending provider.  I have reviewed the above documentation for accuracy and completeness, and I agree with the above.  This note was electronically signed.    Molli Hazard, MD  09/21/2016 8:24 AM

## 2016-09-23 ENCOUNTER — Other Ambulatory Visit (HOSPITAL_COMMUNITY): Payer: Self-pay | Admitting: Oncology

## 2016-09-23 ENCOUNTER — Encounter (HOSPITAL_COMMUNITY)
Admission: RE | Admit: 2016-09-23 | Discharge: 2016-09-23 | Disposition: A | Discharge status: Home / Self Care | Payer: Medicare Other | Source: Ambulatory Visit | Attending: Hematology & Oncology | Admitting: Hematology & Oncology

## 2016-09-23 ENCOUNTER — Encounter (HOSPITAL_COMMUNITY): Payer: Self-pay

## 2016-09-23 DIAGNOSIS — E860 Dehydration: Secondary | ICD-10-CM

## 2016-09-23 DIAGNOSIS — G893 Neoplasm related pain (acute) (chronic): Secondary | ICD-10-CM

## 2016-09-23 DIAGNOSIS — C321 Malignant neoplasm of supraglottis: Secondary | ICD-10-CM | POA: Diagnosis not present

## 2016-09-23 DIAGNOSIS — C329 Malignant neoplasm of larynx, unspecified: Secondary | ICD-10-CM

## 2016-09-23 MED ORDER — SODIUM CHLORIDE 0.9 % IV SOLN: Freq: Once | INTRAVENOUS | Status: DC

## 2016-09-23 MED ORDER — SODIUM CHLORIDE 0.9 % IV SOLN
Freq: Once | INTRAVENOUS | Status: AC
  Administered 2016-09-23: 13:00:00 via INTRAVENOUS

## 2016-09-23 MED ORDER — SODIUM CHLORIDE 0.9% FLUSH: 10.0000 mL | INTRAVENOUS | Status: DC | PRN

## 2016-09-23 MED ORDER — HEPARIN SOD (PORK) LOCK FLUSH 100 UNIT/ML IV SOLN
500.0000 [IU] | INTRAVENOUS | Status: AC | PRN
  Administered 2016-09-23: 500 [IU]
  Filled 2016-09-23: qty 5

## 2016-09-27 ENCOUNTER — Encounter (HOSPITAL_COMMUNITY): Payer: Self-pay | Admitting: Speech Pathology

## 2016-09-27 ENCOUNTER — Encounter (HOSPITAL_COMMUNITY)
Admission: RE | Admit: 2016-09-27 | Discharge: 2016-09-27 | Disposition: A | Discharge status: Home / Self Care | Payer: Medicare Other | Source: Ambulatory Visit | Attending: Hematology & Oncology | Admitting: Hematology & Oncology

## 2016-09-27 ENCOUNTER — Ambulatory Visit (HOSPITAL_COMMUNITY): Payer: Medicare Other | Attending: Hematology & Oncology | Admitting: Speech Pathology

## 2016-09-27 DIAGNOSIS — R1312 Dysphagia, oropharyngeal phase: Secondary | ICD-10-CM | POA: Diagnosis present

## 2016-09-27 DIAGNOSIS — C321 Malignant neoplasm of supraglottis: Secondary | ICD-10-CM | POA: Insufficient documentation

## 2016-09-27 MED ORDER — HEPARIN SOD (PORK) LOCK FLUSH 100 UNIT/ML IV SOLN
500.0000 [IU] | INTRAVENOUS | Status: AC | PRN
  Administered 2016-09-27: 500 [IU]
  Filled 2016-09-27: qty 5

## 2016-09-27 MED ORDER — SODIUM CHLORIDE 0.9 % IV SOLN
INTRAVENOUS | Status: DC
Start: 2016-09-27 — End: 2016-09-28
  Administered 2016-09-27: 1000 mL via INTRAVENOUS

## 2016-09-27 NOTE — Patient Instructions (Signed)
Radiation Therapy to the Head and Neck: What You Need to Know About Swallowing   This information describes swallowing problems that can be caused by radiation therapy to the head and neck and how to prevent them. Normal Swallowing Many muscles and nerves work together to help you swallow (see figure below). When you eat and drink, food and liquids mix with your saliva. Your saliva makes the food soft and moist and chewing breaks the food down further. As you chew, the food and saliva form a ball called a bolus.  The bolus gets pushed to the back of your mouth with your tongue. Then, a reflex takes over and the back of your tongue squeezes back and your larynx (voice box) closes. This sends the bolus down your esophagus (food pipe) and into your stomach. If your tongue is weak or if your larynx doesn't close all the way, food or liquid will enter your trachea (wind pipe) or lungs (aspiration), which can cause pneumonia.  Effects of Cancer and Treatment on Swallowing Depending on the size and location of your tumor, the structures that support normal swallowing may not work well. The side effects of treatment can also affect these structures. Radiation therapy can cause: Sores (mucositis) in the mouth and throat  Dry mouth  Thicker saliva  Swelling  Pain when swallowing These symptoms begin about the second week of treatment and may get worse during treatment. Most symptoms will start to improve about 2 weeks after treatment has ended. Radiation therapy can also cause permanent tissue scarring. The effects of this scarring depend on the area that was treated. Below are some effects of scarring: The muscles attached to your jaw may tighten and make it difficult to open your mouth and chew your food. This is called trismus.  Your salivary glands may not produce enough saliva. This can make swallowing difficult because your mouth is too dry.  The muscles in your tongue and the back of your throat  may not be able to move as well. This can make it harder to push the bolus down your throat and open up your esophagus.  Your larynx may not lift enough to open your esophagus.  Your esophagus may narrow, which can cause food to get stuck in the back of your throat. Not everyone will have all of these problems. Your treatment will be planned to decrease your chances of developing them. Your healthcare team will also teach you things that you can do to help decrease these problems. Other treatments can also affect swallowing. Surgery can affect the structures in your mouth and throat, which could make swallowing more difficult. Some chemotherapy medications can cause sores in the mouth and throat. This can make swallowing painful.  Swallowing Problems Difficulty swallowing is called dysphagia (dis-fey-juh). Your healthcare team will work with you to help you manage this problem. This team includes your doctors, nurses, a swallowing specialist, and a dietitian. Painful swallowing If you have painful swallowing during your treatment, you will be given pain medication to manage it. Take the medication as instructed by your doctor. If it does not help, tell your doctor or nurse. Try to use your swallowing muscles as much as you can. This can help prevent long-term problems with swallowing. Tell your doctor or nurse and swallowing specialist if you have trouble opening your mouth during or after your treatment. They can teach you exercises to help with this. Aspiration When you are having trouble swallowing, food or liquid can pool   in the back of your throat. It can then pass into your airway instead of your esophagus. This is called aspiration. Signs of aspiration include coughing during or after swallowing. Do not try to swallow if you have any of these signs. Call your swallowing specialist immediately. He or she will review the swallowing exercises with you and help figure out why you are having trouble.  He or she will also tell you which foods and liquids are safe for you. Aspiration can lead to pneumonia. Call your doctor or nurse immediately if you have any of the following symptoms: Shortness of breath  Wheezing  Painful breathing  A productive cough (a cough that produces phlegm or mucus)  A temperature of 100.4 F (38 C) or higher  Managing Swallowing Problems Swallowing therapy You will see a swallowing specialist before and during your treatment. He or she will: Explain how treatment can affect your swallowing  Teach you exercises to stretch and strengthen the muscles involved in swallowing Exercises Do these exercises as soon as you begin treatment. They may help decrease long-term swallowing problems. Swallowing Exercises Tongue hold exercise ([place] exercise) 1. Put the tip of your tongue in between your teeth. If you cannot do this, put it against the front of the roof of your mouth. Stick your tongue out as far as you can. 2. Hold this position with your tongue and swallow. Try not to let your tongue tip slip back. Then, relax. Effortful swallow exercise 1. Swallow normally, but squeeze hard with your throat and tongue muscles. 2. Then, relax. Mendelsohn swallow maneuver exercise 1. Swallow normally; feel your voice box go up and down. 2. Swallow again. When you feel your voice box go up, squeeze hard and hold it up for 2 seconds. Then, relax. Supraglottic swallow exercise 1. Take in a regular breath and hold it tightly. 2. While holding your breath, swallow; immediately, cough out the breath. Do not inhale before you cough. Super supraglottic swallow exercise 1. Take in a regular breath and hold it tightly, bearing down as if you are having a bowel movement. 2. While holding your breath, swallow; immediately, cough out the breath. Do not inhale before you cough. Shaker exercise 1. Lay flat on the floor or a bed. 2. Lift your head as if you are looking at your  toes. 3. Hold your head in this position for 10 seconds. Increase the amount of time until you can hold it for 1 minute. Then, relax. 4. Lift your head and lay it back down (do not hold it up.) Repeat this movement 30 times. Do these exercises _____ times a day. Tongue range of motion (ROM) exercises Tongue protrusion exercise 1. Stick out your tongue as far as you can until you feel a good stretch. 2. Hold it there for ________________________. Then, relax. Tongue retraction exercise 1. Pull your tongue far back in your mouth, as if you are gargling or yawning. 2. Hold it there for ________________________. Then, relax. Tongue lateralization exercise 1. Move your tongue as far to the left as you can so you feel a good stretch in your tongue. 2. Hold it there for ________________________. Then, relax. 3. Move your tongue to the right as far as you can until you feel a good stretch in your tongue. 4. Hold it there for ________________________. Then, relax. Tongue tip exercise 1. Place the tip of your tongue behind your top teeth or on your gums. 2. While holding this position, open your mouth as   wide as possible for ______________. Then, relax. Back tongue exercise 1. Say "kuh" with a strong "k" sound; move the back of your tongue as if you were going to say the "k" sound. 2. Hold this position for__________________. Then, relax. Do these exercises _____ times a day. Tongue Strengthening Exercises You will need a tongue depressor or spoon to do these exercises. Tongue tip strengthening exercise 1. Stick out your tongue as far as you can and push it firmly against a tongue depressor or spoon. 2. Hold this position for__________________. Then, relax. Sides of tongue strengthening exercise 1. Place the tongue depressor or spoon against the left side of your tongue; push your tongue firmly against the tongue depressor or spoon. 2. Hold this position for__________________. Then,  relax. 3. Place the tongue depressor or spoon against the right side of your tongue; push your tongue firmly against the tongue depressor or spoon. 4. Hold this position for__________________. Then, relax. Top of the tongue strengthening exercise 1. Push down on your tongue with a tongue depressor or spoon. As you are doing this, push up with your tongue. 2. Hold this position for__________________. Then, relax. Do these exercises _____ times a day. Jaw Exercises Active range of motion and stretching exercises Sit or stand. Hold your head still while doing these exercises. 1. Open your mouth as wide as you can, until you can feel a good stretch but no pain. Hold this stretch for ______ seconds.   2. Move your jaw to the left. Hold this stretch for 3 seconds.   3. Move your jaw to the right. Hold this stretch for 3 seconds.   4. Move your jaw in a circle. Make 5 circles in each direction.   Do these exercises _____ times a day. Passive Stretching Exercise  1. Place 1 thumb on your top teeth in the middle of your jaw. 2. Place the pointer (index) finger of your other hand on your bottom teeth in the middle of your jaw. 3. Open your mouth with your fingers, but do not bite down or resist. Let your fingers do all of the work. 4. Hold this stretch for _____ seconds. Your swallowing specialist may teach you other exercises or strategies to help you continue swallowing throughout your treatment. These will be based on your swallowing evaluations. Dietary Guidelines Eating well is an important part of your cancer treatment. If you are having pain or difficulty swallowing, you may not be able to eat enough food. This can make you lose weight and decrease your energy. You also may not be able to drink enough liquid to stay hydrated. The suggestions in this section may help make swallowing easier and more comfortable for you. Your swallowing specialist will recommend the proper food and liquid  textures for you. When you try new foods and liquids, they should have the textures recommended by your swallowing specialist. The following foods may irritate your throat or be hard to swallow: Very hot or cold foods and liquids  Citrus fruits and juices (e.g., orange, lemon, lime, grapefruit, pineapple)  Tomatoes  Hard, dry, or coarse foods (e.g., toast, crackers, raw vegetables, potato chips, pretzels)  Spices (e.g., pepper, chili powder, horseradish, curry powder, hot sauce, nutmeg) The following foods may be easier to swallow: Soft, pured, or moist foods such as souffls or casseroles  Soft foods such as custards and puddings  Lukewarm or room-temperature foods  Liquid nutritional supplements, such as:  Carnation Breakfast EssentialsTM  Carnation Instant Breakfast VHC  Scandishake    Ensure  Boost  Glucerna (if you have diabetes) These items can be found at your local grocery store, pharmacy, or on the Internet. Try the following suggestions if dry mouth or thick saliva is a problem for you: Drink 8 to 10 cups of liquids a day. Being well-hydrated will help loosen thick saliva.  Keep a bottle of water or other liquid with you when you are away from home. Sip from it frequently throughout the day.  Chew sugarless gum or suck on sugarless candy. This can cause more saliva to flow.  Add sauces, gravies, or other liquids to your foods.  Use a humidifier at home to help loosen thick saliva and secretions. For more dietary recommendations, ask your nurse or dietitian.  Feeding Tube  You may have a feeding tube placed to maintain your weight and ensure you get the nutrition you need. Even if you are using a feeding tube, eat and drink as much as you can. This will decrease the chance that you will have long-term swallowing problems. If you have a feeding tube placed, you nurse will teach you how to use it and give you written instructions. He or she will tell you the kind of formula  to use and how much formula to use for each feeding. At the end of your treatment, your doctor or nurse will tell you how to start taking all of your foods and liquids by mouth. The feeding tube will be removed when you no longer need it. If you have any questions about your feeding tube, contact the office of the doctor.  

## 2016-09-27 NOTE — Therapy (Signed)
[facility], [place], [address] Phone: [phone]   Fax:  [phone]  Speech Language Pathology Treatment  Patient Details  Name: JAHAZIAH CHEESEMAN MRN: [id number] Date of Birth: 02-Nov-1945 No Data Recorded  Encounter Date: 09/27/2016      End of Session - 09/27/16 1558    Visit Number 2   Number of Visits 8   Date for SLP Re-Evaluation 11/23/16   Authorization Type [employer] Medicare   SLP Start Time 1458   SLP Stop Time  1545   SLP Time Calculation (min) 47 min   Activity Tolerance Patient tolerated treatment well      Past Medical History:  Diagnosis Date  . AAA (abdominal aortic aneurysm) ([place])   . Anxiety   . Cancer (HCC)    laryngeal  . 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 Carpal tunnel syndrome   . Cellulitis   . COPD (chronic obstructive pulmonary disease) ([place])   . Epigastric hernia   . GERD (gastroesophageal reflux disease)   . Hyperlipidemia   . Hypertension     used to take amlodipine, none now    Past Surgical History:  Procedure Laterality Date  . APPENDECTOMY    . DIRECT LARYNGOSCOPY N/A 05/16/2016   Procedure: MICRO DIRECT LARYNGOSCOPY WITH BIOPSY;  Surgeon: Leta Baptist, MD;  Location: [facility];  Service: ENT;  Laterality: N/A;  . ESOPHAGOGASTRODUODENOSCOPY N/A 06/27/2016   Procedure: ESOPHAGOGASTRODUODENOSCOPY (EGD);  Surgeon: Aviva Signs, MD;  Location: [facility];  Service: General;  Laterality: N/A;  . HEMORROIDECTOMY    . IR GENERIC HISTORICAL  07/14/2016   IR RADIOLOGIST EVAL & MGMT [facility]  . IR GENERIC HISTORICAL  07/14/2016   IR PATIENT EVAL TECH 0-60 MINS [facility]  . PEG PLACEMENT N/A 06/27/2016   Procedure: PERCUTANEOUS ENDOSCOPIC GASTROSTOMY (PEG) PLACEMENT;  Surgeon: Aviva Signs, MD;  Location: [facility];  Service: General;  Laterality: N/A;  . PORTACATH PLACEMENT Left 06/27/2016   Procedure: INSERTION  PORT-A-CATH;  Surgeon: Aviva Signs, MD;  Location: [facility];  Service: General;  Laterality: Left;  [facility] VASCULAR SURGERY Right 2008   stent to right common iliac artery    There were no vitals filed for this visit.      Subjective Assessment - 09/27/16 1530    Subjective "I am doing a lot better."   Currently in Pain? No/denies           ADULT SLP TREATMENT - 09/27/16 0001      General Information   Behavior/Cognition Alert;Cooperative   Patient Positioning Upright in chair   Oral care provided N/A   HPI Mr. Dyshawn Lelli is a 71 yo male who was referred by Dr. Whitney Muse for clinical swallow evaluation and treat as part of head and neck cancer treatment protocol. Mr. Wickwire reports a sore throat which started in February 2017, which he discussed with his PCP, Dr. Woody Seller in July 2017. He was seen by Dr. 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 He will be undergoing concurrent chemoradiation (Cisplatin every 21 days for 3 cycles) with curative intent. He is scheduled to have port and PEG placed on Monday, June 27, 2016 in preparation for treatment. Pt currently weighs 169#, smokes 5-6 cigars a day, and reports mild odynophagia with all textures/consistencies. He takes Tramadol for pain.     Treatment Provided  Treatment provided Dysphagia     Dysphagia Treatment   Temperature Spikes Noted No   Respiratory Status Room air   Oral Cavity - Dentition Edentulous   Treatment Methods Skilled observation;Upgraded PO texture trial;Patient/caregiver education;Compensation strategy training   Patient observed directly with PO's Yes   Type of PO's observed Regular;Thin liquids   Feeding Able to feed self   Liquids provided via Cup   Oral Phase Signs & Symptoms Prolonged bolus formation   Pharyngeal Phase Signs & Symptoms Multiple swallows   Type of cueing Verbal   Amount of cueing Minimal     Pain Assessment   Pain  Assessment No/denies pain     Assessment / Recommendations / Plan   Plan Continue with current plan of care     Dysphagia Recommendations   Diet recommendations Dysphagia 3 (mechanical soft);Thin liquid   Liquids provided via Cup   Medication Administration Whole meds with puree   Supervision Patient able to self feed   Compensations Slow rate;Multiple dry swallows after each bite/sip   Postural Changes and/or Swallow Maneuvers Out of bed for meals;Seated upright 90 degrees;Upright 30-60 min after meal     Progression Toward Goals   Progression toward goals Progressing toward goals          SLP Education - 09/27/16 1557    Education provided Yes   Education Details soft food choices, chin tuck against resistance swallowing exercises, increasing oral intake   Person(s) Educated Patient   Methods Explanation;Demonstration;Handout;Verbal cues   Comprehension Verbalized understanding;Need further instruction          SLP Short Term Goals - 09/27/16 1601      SLP SHORT TERM GOAL #1   Title Pt will complete swallowing exercises 3x/day 7 days a week with use of written cues and pt/caregiver report.    Period Months   Status On-going     SLP SHORT TERM GOAL #2   Title Pt will demonstrate safe and efficient consumption of self regulated regular textures with use of compensatory strategies as needed.   Period Months   Status On-going            Plan - 09/27/16 1559    Clinical Impression Statement Pt seen in office for dysphagia intervention. He has not been seen since his initial evaluation due to hospitalization and inability for pt to get to appointment. He reports that he is "now doing much better". He has completed chemotherapy and radiation. He went for IV fluids today, but this was his last scheduled appointment.  Pt tells me that his is able to eat more by mouth now (apple pie, oatmeal, peaches, coffee, and applesauce). He was previously given swallowing exercises and  soft food choices, however they were given again today. Pt read through the list of food ideas and stated that he would go down the list at home, trying different items. He tells me that he loves meat loaf, so he was encouraged to try this at home. He feels hungry at times, sense of smell is "ok", and taste is "coming back". He tells me that he is only using 1 can via PEG at this time and says that he does not recall whether he was/is supposed to taper feeds. SLP reviewed last oncology note (had lost 4 lbs from previous visit) and registered dietician's note with patient. Pt weighed in our clinic today at 151.6 pounds (wearing heavy boots).  Swallowing exercises reviewed and pt able to return demonstrate with SLP cues.  SLP encouraged chin tuck against resistance exercise daily (3x/day). 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 Maximal jaw opening with allowance for lack of dentition was measured at 52 mm. Will follow up with Pt in 4-6 weeks to ensure continued progress toward safe and efficient po intake and adherence to exercises.   Speech Therapy Frequency Monthly   Duration --  ~6 visits   Treatment/Interventions Aspiration precaution training;Diet toleration management by SLP;SLP instruction and feedback;Patient/family education   Potential to Achieve Goals Good   Potential Considerations Pain level   SLP Home Exercise Plan Pt will be independent with HEP as assigned to faciliate carrover of treatment strategies and techniques in home environment   Consulted and Agree with Plan of Care Patient      Patient will benefit from skilled therapeutic intervention in order to improve the following deficits and impairments:   Dysphagia, oropharyngeal phase    Problem List Patient Active Problem List   Diagnosis Date Noted  . 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 Dehydration   . Cancer related pain   . AKI (acute  kidney injury) ([place]) 08/24/2016  . Protein-calorie malnutrition, severe 08/23/2016  . Hypokalemia 08/21/2016  . Anxiety 08/21/2016  . Acute kidney injury ([place]) 08/21/2016  . Normocytic anemia 08/21/2016  . Thrombocytopenia ([place]) 08/21/2016  . GERD (gastroesophageal reflux disease) 08/21/2016  . Constipation 08/21/2016  . 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 COPD (chronic obstructive pulmonary disease) ([place]) 06/09/2013  . Tobacco abuse 09/29/2011  . HTN (hypertension) 09/29/2011   Thank you,  Genene Churn, [place]  [place] 09/27/2016, 4:02 PM  [facility] [address] [place], [place], [address] Phone: [phone]   Fax:  [phone]   Name: AZARIYAH RAMUS MRN: [id number] Date of Birth: [birth year]

## 2016-09-27 NOTE — Addendum Note (Signed)
Addended by: Ephraim Hamburger on: 09/27/2016 05:19 PM   Modules accepted: Orders

## 2016-10-06 ENCOUNTER — Encounter (HOSPITAL_COMMUNITY): Payer: Medicare Other | Attending: Hematology & Oncology | Admitting: Oncology

## 2016-10-06 ENCOUNTER — Encounter (HOSPITAL_COMMUNITY): Payer: Self-pay | Admitting: Oncology

## 2016-10-06 ENCOUNTER — Encounter (HOSPITAL_COMMUNITY): Payer: Medicare Other

## 2016-10-06 DIAGNOSIS — C770 Secondary and unspecified malignant neoplasm of lymph nodes of head, face and neck: Secondary | ICD-10-CM

## 2016-10-06 DIAGNOSIS — Z720 Tobacco use: Secondary | ICD-10-CM | POA: Diagnosis not present

## 2016-10-06 DIAGNOSIS — C321 Malignant neoplasm of supraglottis: Secondary | ICD-10-CM | POA: Diagnosis not present

## 2016-10-06 LAB — COMPREHENSIVE METABOLIC PANEL
ALBUMIN: 3.5 g/dL (ref 3.5–5.0)
ALK PHOS: 84 U/L (ref 38–126)
ALT: <5 U/L — ABNORMAL LOW (ref 17–63)
AST: 14 U/L — ABNORMAL LOW (ref 15–41)
Anion gap: 7 (ref 5–15)
BUN: 15 mg/dL (ref 6–20)
CALCIUM: 9.8 mg/dL (ref 8.9–10.3)
CHLORIDE: 97 mmol/L — AB (ref 101–111)
CO2: 29 mmol/L (ref 22–32)
Creatinine, Ser: 1.25 mg/dL — ABNORMAL HIGH (ref 0.61–1.24)
GFR calc non Af Amer: 57 mL/min — ABNORMAL LOW (ref >60)
GLUCOSE: 116 mg/dL — AB (ref 65–99)
Potassium: 3.7 mmol/L (ref 3.5–5.1)
SODIUM: 133 mmol/L — AB (ref 135–145)
Total Bilirubin: 0.3 mg/dL (ref 0.3–1.2)
Total Protein: 7.4 g/dL (ref 6.5–8.1)

## 2016-10-06 LAB — CBC WITH DIFFERENTIAL/PLATELET
BASOS PCT: 1 %
Basophils Absolute: 0.1 10*3/uL (ref 0.0–0.1)
EOS ABS: 0.6 10*3/uL (ref 0.0–0.7)
EOS PCT: 6 %
HCT: 35.9 % — ABNORMAL LOW (ref 39.0–52.0)
Hemoglobin: 12 g/dL — ABNORMAL LOW (ref 13.0–17.0)
LYMPHS ABS: 1.5 10*3/uL (ref 0.7–4.0)
Lymphocytes Relative: 16 %
MCH: 31 pg (ref 26.0–34.0)
MCHC: 33.4 g/dL (ref 30.0–36.0)
MCV: 92.8 fL (ref 78.0–100.0)
MONOS PCT: 7 %
Monocytes Absolute: 0.7 10*3/uL (ref 0.1–1.0)
NEUTROS PCT: 72 %
Neutro Abs: 7 10*3/uL (ref 1.7–7.7)
Platelets: 242 10*3/uL (ref 150–400)
RBC: 3.87 MIL/uL — ABNORMAL LOW (ref 4.22–5.81)
RDW: 17.9 % — ABNORMAL HIGH (ref 11.5–15.5)
WBC: 9.8 10*3/uL (ref 4.0–10.5)

## 2016-10-06 LAB — MAGNESIUM: Magnesium: 2 mg/dL (ref 1.7–2.4)

## 2016-10-06 NOTE — Progress Notes (Signed)
Bruce Chroman, MD [place] [address]  Cancer of aryepiglottic fold or interarytenoid fold, laryngeal aspect ([place]) - Plan: CBC with Differential, Comprehensive metabolic panel, Magnesium  CURRENT THERAPY: Supportive care  INTERVAL HISTORY: Bruce [name] 71 y.o. 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    Cancer of aryepiglottic fold or interarytenoid fold, laryngeal aspect ([place])   05/16/2016 Procedure    Microdirect laryngoscopy and biopsy by Dr. Benjamine Mola      05/18/2016 Pathology Results    Diagnosis 1. Larynx, biopsy, Right aryepiglottic - INVASIVE POORLY DIFFERENTIATED CARCINOMA. - SEE COMMENT. 2. Larynx, biopsy, Left Aryepiglottic Fold - INVASIVE POORLY      06/14/2016 PET scan    1. Left-sided primary centered at the piriform sinus and aryepiglottic fold. 2. Bilateral cervical nodal metastasis. 3. No extracervical metastatic disease identified. 4. Bilateral pulmonary hypermetabolism is suspicious for developing interstitial lung disease (likely nonspecific interstitial pneumonia) superimposed upon centrilobular and paraseptal emphysema. Infectious process is felt less likely. 5. Hypermetabolic focus in the left side of the prostate is nonspecific. Consider correlation with PSA level and physical exam. 6. Right latissimus muscular hypermetabolism is likely incidental but warrants followup attention.      06/27/2016 Procedure     Port-A-Cath insertion, EGD with PEG placement by Dr. Arnoldo Morale      07/01/2016 - 08/12/2016 Chemotherapy    The patient had palonosetron (ALOXI) injection 0.25 mg, 0.25 mg, Intravenous,  Once, 3 of 3 cycles  CISplatin (PLATINOL) 191 mg in sodium chloride 0.9 % 500 mL chemo infusion, 100 mg/m2 = 191 mg, Intravenous,  Once, 3 of 3 cycles  fosaprepitant (EMEND) 150 mg, dexamethasone (DECADRON) 12 mg in sodium chloride 0.9 % 145 mL IVPB, ,  Intravenous,  Once, 3 of 3 cycles  for chemotherapy treatment.        07/01/2016 - 09/01/2016 Radiation Therapy         08/21/2016 - 08/26/2016 Hospital Admission    Admit date: 08/21/2016 Admission diagnosis: AKI Additional comments: Reported to ED with chest pain.      He reports that he is improving slowly.  Appetite is slowly improving.  Weight is up 1 lbs.  He notes some good days and bad days.  He thinks he is drinking enough H2O.    He is unaware of any XRT follow-up.  He reports that he would rather not get more IVF.  However, he is agreeable to IVF if his renal function shows signs of dehydration.  He continues to smoke 5-6 cigars daily.  He notes some fatigue today, but the last few days he notes that he was improving.  Review of Systems  Constitutional: Positive for malaise/fatigue. Negative for chills, fever and weight loss.  HENT: Positive for sore throat (improving).   Eyes: Negative.   Respiratory: Negative.  Negative for cough.   Cardiovascular: Negative.  Negative for chest pain.  Gastrointestinal: Negative.  Negative for abdominal pain, nausea and vomiting.  Genitourinary: Negative.   Musculoskeletal: Negative.  Negative for falls.  Skin: Negative.   Neurological: Negative.  Negative for weakness.  Endo/Heme/Allergies: Negative.   Psychiatric/Behavioral: Negative.     Past Medical History:  Diagnosis Date  . AAA (abdominal aortic aneurysm) ([place])   . Anxiety   . Cancer (HCC)    laryngeal  . Cancer of aryepiglottic fold or interarytenoid fold, laryngeal aspect ([place]) 06/15/2016  . Carpal tunnel syndrome   .  Cellulitis   . COPD (chronic obstructive pulmonary disease) ([place] [facility])   . Epigastric hernia   . GERD (gastroesophageal reflux disease)   . Hyperlipidemia   . Hypertension     used to take amlodipine, none now    Past Surgical History:  Procedure Laterality Date  . APPENDECTOMY    . DIRECT LARYNGOSCOPY N/A 05/16/2016   Procedure: MICRO DIRECT  LARYNGOSCOPY WITH BIOPSY;  Surgeon: Leta Baptist, MD;  Location: [facility];  Service: ENT;  Laterality: N/A;  . ESOPHAGOGASTRODUODENOSCOPY N/A 06/27/2016   Procedure: ESOPHAGOGASTRODUODENOSCOPY (EGD);  Surgeon: Aviva Signs, MD;  Location: [facility];  Service: General;  Laterality: N/A;  . HEMORROIDECTOMY    . IR GENERIC HISTORICAL  07/14/2016   IR RADIOLOGIST EVAL & MGMT [facility]  . IR GENERIC HISTORICAL  07/14/2016   IR PATIENT EVAL TECH 0-60 MINS [facility]  . PEG PLACEMENT N/A 06/27/2016   Procedure: PERCUTANEOUS ENDOSCOPIC GASTROSTOMY (PEG) PLACEMENT;  Surgeon: Aviva Signs, MD;  Location: [facility];  Service: General;  Laterality: N/A;  . PORTACATH PLACEMENT Left 06/27/2016   Procedure: INSERTION PORT-A-CATH;  Surgeon: Aviva Signs, MD;  Location: [facility];  Service: General;  Laterality: Left;  [facility] VASCULAR SURGERY Right 2008   stent to right common iliac artery    Family History  Problem Relation Age of Onset  . Heart failure Mother   . Coronary artery disease Father   . Stroke Father   . Alcoholism Father   . Heart disease Brother     MI    Social History   Social History  . Marital status: Widowed    Spouse name: N/A  . Number of children: N/A  . Years of education: N/A   Social History Main Topics  . Smoking status: Current Every Day Smoker    Packs/day: 0.00    Years: 12.00    Types: Cigars  . Smokeless tobacco: Never Used     Comment: 5-7 cigars daily  . Alcohol use No  . Drug use: No  . Sexual activity: Not Currently   Other Topics Concern  . None   Social History Narrative  . None     PHYSICAL EXAMINATION  ECOG PERFORMANCE STATUS: 1 - Symptomatic but completely ambulatory  Vitals:   10/06/16 1001  BP: (!) 142/65  Pulse: (!) 101  Resp: 16  Temp: 97.4 F (36.3 C)     GENERAL:alert, no distress, cooperative and unaccompanied. SKIN: skin color, texture, turgor are normal, no rashes or significant lesions HEAD: Normocephalic, No  masses, lesions, tenderness or abnormalities EYES: normal, EOMI, Conjunctiva are pink and non-injected EARS: External ears normal OROPHARYNX:lips, buccal mucosa, and tongue normal and mucous membranes are moist.  Dark tongue NECK: supple, trachea midline, erythematous anterior cervical area. LYMPH:  not examined BREAST:not examined LUNGS: clear to auscultation  HEART: regular rate & rhythm ABDOMEN:abdomen soft BACK: Back symmetric, no curvature. EXTREMITIES:less then 2 second capillary refill, no joint deformities, effusion, or inflammation, no skin discoloration, no cyanosis  NEURO: alert & oriented x 3 with fluent speech, no focal motor/sensory deficits, gait normal   LABORATORY DATA: CBC    Component Value Date/Time   WBC 11.5 (H) 09/21/2016 0932   RBC 3.89 (L) 09/21/2016 0932   HGB 11.6 (L) 09/21/2016 0932   HCT 35.9 (L) 09/21/2016 0932   PLT 220 09/21/2016 0932   MCV 92.3 09/21/2016 0932   MCH 29.8 09/21/2016 0932   MCHC 32.3 09/21/2016 0932   RDW 19.1 (H) 09/21/2016 0932  LYMPHSABS 1.9 09/21/2016 0932   MONOABS 0.9 09/21/2016 0932   EOSABS 0.4 09/21/2016 0932   BASOSABS 0.1 09/21/2016 0932      Chemistry      Component Value Date/Time   NA 131 (L) 09/21/2016 0932   K 4.4 09/21/2016 0932   CL 95 (L) 09/21/2016 0932   CO2 26 09/21/2016 0932   BUN 18 09/21/2016 0932   CREATININE 1.32 (H) 09/21/2016 0932   CREATININE 0.83 06/16/2011 1155      Component Value Date/Time   CALCIUM 10.0 09/21/2016 0932   ALKPHOS 78 09/21/2016 0932   AST 19 09/21/2016 0932   ALT 13 (L) 09/21/2016 0932   BILITOT 0.6 09/21/2016 0932        PENDING LABS:   RADIOGRAPHIC STUDIES:  No results found.   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  S/P concomitant chemoXRT with Cisplatin 07/01/2016-  08/12/2016).  Oncology history updated.  Labs today: CBC diff, CMET, magnesium.  I personally reviewed and went over laboratory results with the patient.  The results are noted within this dictation.  Labs in 2-3 weeks: CBC diff, CMET.  Chart reviewed.  I have reviewed SLP, Genene Churn, note in detail.  He is to follow-up with her again in Feb 2018 for ongoing progression assessment.  He is reminded of SLP recommendations and exercises daily.  He has follow-up appointment with XRT on 11/21/2016.  Will defer timing of restaging images to XRT.  His weight is stable and improved 1+ lbs.  He does not want fluids, but he is agreeable if his renal function indicates that he needs IVF.    Smoking cessation education is provided.  He continues to smoke 5-6 cigars per day.  Return in 2-3 weeks for follow-up.   ORDERS PLACED FOR THIS ENCOUNTER: No orders of the defined types were placed in this encounter.   MEDICATIONS PRESCRIBED THIS ENCOUNTER: No orders of the defined types were placed in this encounter.   THERAPY PLAN:  Supportive care  All questions were answered. The patient knows to call the clinic with any problems, questions or concerns. We can certainly see the patient much sooner if necessary.  Patient and plan discussed with Dr. Ancil Linsey and she is in agreement with the aforementioned.   This note is electronically signed by: Doy Mince 10/06/2016 10:28 AM

## 2016-10-06 NOTE — Assessment & Plan Note (Addendum)
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  S/P concomitant chemoXRT with Cisplatin 07/01/2016- 08/12/2016).  Oncology history updated.  Labs today: CBC diff, CMET, magnesium.  I personally reviewed and went over laboratory results with the patient.  The results are noted within this dictation.  Labs in 2-3 weeks: CBC diff, CMET.  Chart reviewed.  I have reviewed SLP, Genene Churn, note in detail.  He is to follow-up with her again in Feb 2018 for ongoing progression assessment.  He is reminded of SLP recommendations and exercises daily.  He has follow-up appointment with XRT on 11/21/2016.  Will defer timing of restaging images to XRT.  His weight is stable and improved 1+ lbs.  He does not want fluids, but he is agreeable if his renal function indicates that he needs IVF.    Smoking cessation education is provided.  He continues to smoke 5-6 cigars per day.  Return in 2-3 weeks for follow-up.

## 2016-10-06 NOTE — Patient Instructions (Addendum)
Bruce [name] at [facility] Discharge Instructions  RECOMMENDATIONS MADE BY THE CONSULTANT AND ANY TEST RESULTS WILL BE SENT TO YOUR REFERRING PHYSICIAN.  You saw [name], PA-C, today. Labs today. We will call if IVF's are needed. Follow up with radiation as scheduled on 11/22/16. Follow up in 2-3 weeks with labs. Follow up with speech as directed in February 2018. See [name] at checkout for appointments.  Thank you for choosing [place] at [facility] to provide your oncology and hematology care.  To afford each patient quality time with our provider, please arrive at least 15 minutes before your scheduled appointment time.    If you have a lab appointment with the [facility] please come in thru the  Main Entrance and check in at the main information desk  You need to re-schedule your appointment should you arrive 10 or more minutes late.  We strive to give you quality time with our providers, and arriving late affects you and other patients whose appointments are after yours.  Also, if you no show three or more times for appointments you may be dismissed from the clinic at the providers discretion.     Again, thank you for choosing [facility].  Our hope is that these requests will decrease the amount of time that you wait before being seen by our physicians.       _____________________________________________________________  Should you have questions after your visit to [facility], please contact our office at [phone] between the hours of 8:30 a.m. and 4:30 p.m.  Voicemails left after 4:30 p.m. will not be returned until the following business day.  For prescription refill requests, have your pharmacy contact our office.       Resources For Cancer Patients and their Caregivers ? American Cancer Society: Can assist with transportation, wigs, general needs, runs Look Good Feel Better.         (681)140-0486 ? Cancer Care: Provides financial assistance, online support groups, medication/co-pay assistance.  1-800-813-HOPE (715)179-3332) ? [employer] Assists [place] cancer patients and their families through emotional , educational and financial support.  561-055-5069 ? [place] DSS Where to apply for food stamps, Medicaid and utility assistance. 250-887-9332 ? [employer]: Transportation to medical appointments. 669-184-1060 ? Social Security Administration: May apply for disability if have a Stage IV cancer. 647-841-4000 (838)066-4147 ? [employer], Disability and Transit Services: Assists with nutrition, care and transit needs. [facility] Support Programs: @10RELATIVEDAYS @ > Cancer Support Group  2nd Tuesday of the month 1pm-2pm, Journey Room  > Creative Journey  3rd Tuesday of the month 1130am-1pm, Journey Room  > Look Good Feel Better  1st Wednesday of the month 10am-12 noon, Journey Room (Call [facility] to register [phone])

## 2016-10-14 ENCOUNTER — Other Ambulatory Visit (HOSPITAL_COMMUNITY): Payer: Self-pay | Admitting: Radiation Oncology

## 2016-10-14 DIAGNOSIS — C321 Malignant neoplasm of supraglottis: Secondary | ICD-10-CM

## 2016-10-26 ENCOUNTER — Encounter (HOSPITAL_BASED_OUTPATIENT_CLINIC_OR_DEPARTMENT_OTHER): Payer: Medicare Other | Admitting: Oncology

## 2016-10-26 ENCOUNTER — Encounter (HOSPITAL_COMMUNITY): Payer: Medicare Other

## 2016-10-26 ENCOUNTER — Encounter (HOSPITAL_COMMUNITY): Payer: Self-pay

## 2016-10-26 VITALS — BP 144/72 | HR 98 | Temp 97.7°F | Resp 20 | Wt 147.2 lb

## 2016-10-26 DIAGNOSIS — K5900 Constipation, unspecified: Secondary | ICD-10-CM | POA: Diagnosis not present

## 2016-10-26 DIAGNOSIS — G893 Neoplasm related pain (acute) (chronic): Secondary | ICD-10-CM | POA: Diagnosis not present

## 2016-10-26 DIAGNOSIS — C770 Secondary and unspecified malignant neoplasm of lymph nodes of head, face and neck: Secondary | ICD-10-CM

## 2016-10-26 DIAGNOSIS — C321 Malignant neoplasm of supraglottis: Secondary | ICD-10-CM | POA: Diagnosis not present

## 2016-10-26 DIAGNOSIS — E876 Hypokalemia: Secondary | ICD-10-CM

## 2016-10-26 DIAGNOSIS — I739 Peripheral vascular disease, unspecified: Secondary | ICD-10-CM

## 2016-10-26 DIAGNOSIS — Z720 Tobacco use: Secondary | ICD-10-CM | POA: Diagnosis not present

## 2016-10-26 DIAGNOSIS — R634 Abnormal weight loss: Secondary | ICD-10-CM

## 2016-10-26 LAB — COMPREHENSIVE METABOLIC PANEL
ALK PHOS: 73 U/L (ref 38–126)
ALT: 10 U/L — ABNORMAL LOW (ref 17–63)
AST: 18 U/L (ref 15–41)
Albumin: 3.7 g/dL (ref 3.5–5.0)
Anion gap: 10 (ref 5–15)
BUN: 15 mg/dL (ref 6–20)
CALCIUM: 10.1 mg/dL (ref 8.9–10.3)
CO2: 27 mmol/L (ref 22–32)
CREATININE: 1.2 mg/dL (ref 0.61–1.24)
Chloride: 97 mmol/L — ABNORMAL LOW (ref 101–111)
GFR calc non Af Amer: 60 mL/min — ABNORMAL LOW (ref >60)
GLUCOSE: 134 mg/dL — AB (ref 65–99)
Potassium: 4 mmol/L (ref 3.5–5.1)
SODIUM: 134 mmol/L — AB (ref 135–145)
Total Bilirubin: 0.5 mg/dL (ref 0.3–1.2)
Total Protein: 7.4 g/dL (ref 6.5–8.1)

## 2016-10-26 LAB — CBC WITH DIFFERENTIAL/PLATELET
BASOS ABS: 0.1 10*3/uL (ref 0.0–0.1)
Basophils Relative: 1 %
EOS ABS: 0.5 10*3/uL (ref 0.0–0.7)
Eosinophils Relative: 4 %
HCT: 40.4 % (ref 39.0–52.0)
Hemoglobin: 13.5 g/dL (ref 13.0–17.0)
LYMPHS ABS: 1.9 10*3/uL (ref 0.7–4.0)
LYMPHS PCT: 19 %
MCH: 30.7 pg (ref 26.0–34.0)
MCHC: 33.4 g/dL (ref 30.0–36.0)
MCV: 91.8 fL (ref 78.0–100.0)
Monocytes Absolute: 0.9 10*3/uL (ref 0.1–1.0)
Monocytes Relative: 9 %
NEUTROS PCT: 67 %
Neutro Abs: 6.9 10*3/uL (ref 1.7–7.7)
Platelets: 225 10*3/uL (ref 150–400)
RBC: 4.4 MIL/uL (ref 4.22–5.81)
RDW: 15.9 % — ABNORMAL HIGH (ref 11.5–15.5)
WBC: 10.2 10*3/uL (ref 4.0–10.5)

## 2016-10-26 LAB — MAGNESIUM: Magnesium: 2 mg/dL (ref 1.7–2.4)

## 2016-10-26 MED ORDER — ESOMEPRAZOLE MAGNESIUM 40 MG PO CPDR: 40.0000 mg | DELAYED_RELEASE_CAPSULE | Freq: Every day | ORAL | 1 refills | Status: AC

## 2016-10-26 MED ORDER — FENTANYL 25 MCG/HR TD PT72: 25.0000 ug | MEDICATED_PATCH | TRANSDERMAL | 0 refills | Status: DC

## 2016-10-26 MED ORDER — HYDROCODONE-ACETAMINOPHEN 10-325 MG PO TABS: ORAL_TABLET | ORAL | 0 refills | Status: DC

## 2016-10-26 NOTE — Progress Notes (Signed)
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 Benjamine Mola      05/18/2016 Pathology Results    Diagnosis 1. Larynx, biopsy, Right aryepiglottic - INVASIVE POORLY DIFFERENTIATED CARCINOMA. - SEE COMMENT. 2. Larynx, biopsy, Left Aryepiglottic Fold - INVASIVE POORLY      06/14/2016 PET scan    1. Left-sided primary centered at the piriform sinus and aryepiglottic fold. 2. Bilateral cervical nodal metastasis. 3. No extracervical metastatic disease identified. 4. Bilateral pulmonary hypermetabolism is suspicious for developing interstitial lung disease (likely nonspecific interstitial pneumonia) superimposed upon centrilobular and paraseptal emphysema. Infectious process is felt less likely. 5. Hypermetabolic focus in the left side of the prostate is nonspecific. Consider correlation with PSA level and physical exam. 6. Right latissimus muscular hypermetabolism is likely incidental but warrants followup attention.      06/27/2016 Procedure     Port-A-Cath insertion, EGD with PEG placement by Dr. Arnoldo Morale      07/01/2016 - 08/12/2016 Chemotherapy    The patient had palonosetron (ALOXI) injection 0.25 mg, 0.25 mg, Intravenous,  Once, 3 of 3 cycles  CISplatin (PLATINOL) 191 mg in sodium chloride 0.9 % 500 mL chemo infusion, 100 mg/m2 = 191 mg, Intravenous,  Once, 3 of 3 cycles  fosaprepitant (EMEND) 150 mg, dexamethasone (DECADRON) 12 mg in sodium chloride 0.9 % 145 mL IVPB, , Intravenous,  Once, 3 of 3 cycles  for chemotherapy treatment.        07/01/2016 - 09/01/2016 Radiation Therapy         08/21/2016 - 08/26/2016 Hospital Admission    Admit date:  08/21/2016 Admission diagnosis: AKI Additional comments: Reported to ED with chest pain.       HISTORY OF PRESENTING ILLNESS:  Bruce [name] 71 y.o. male is here for a follow up of bilateral laryngeal carcinoma. He has certainly had challenges throughout therapy.  Mr. Dacy returns to the [facility] today for continuing follow up.   No throat pain or trouble swallowing. He complains of having problems with indigestion. He continues to lose weight and is drinking about 1 can of boost per day. He states that he is still experiencing fatigue and is not very active. He is now taking his dietary intake by mouth and doesn't use his peg tube much anymore. Pt occasionally puts water into his peg tube to stay hydrated. Denies any other concerns today.     MEDICAL HISTORY:  Past Medical History:  Diagnosis Date  . AAA (abdominal aortic aneurysm) ([place])   . Anxiety   . Cancer (HCC)    laryngeal  . 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 Carpal tunnel syndrome   . Cellulitis   . COPD (chronic obstructive pulmonary disease) ([place])   . Epigastric hernia   . GERD (gastroesophageal reflux disease)   . Hyperlipidemia   . Hypertension     used to take amlodipine, none now    SURGICAL HISTORY: Past Surgical History:  Procedure Laterality Date  . APPENDECTOMY    . DIRECT LARYNGOSCOPY N/A 05/16/2016   Procedure: MICRO DIRECT LARYNGOSCOPY WITH BIOPSY;  Surgeon: Leta Baptist, MD;  Location: [facility];  Service: ENT;  Laterality:  N/A;  . ESOPHAGOGASTRODUODENOSCOPY N/A 06/27/2016   Procedure: ESOPHAGOGASTRODUODENOSCOPY (EGD);  Surgeon: Aviva Signs, MD;  Location: [facility];  Service: General;  Laterality: N/A;  . HEMORROIDECTOMY    . IR GENERIC HISTORICAL  07/14/2016   IR RADIOLOGIST EVAL & MGMT [facility]  . IR GENERIC HISTORICAL  07/14/2016   IR PATIENT EVAL TECH 0-60 MINS [facility]  . PEG PLACEMENT N/A 06/27/2016   Procedure:  PERCUTANEOUS ENDOSCOPIC GASTROSTOMY (PEG) PLACEMENT;  Surgeon: Aviva Signs, MD;  Location: [facility];  Service: General;  Laterality: N/A;  . PORTACATH PLACEMENT Left 06/27/2016   Procedure: INSERTION PORT-A-CATH;  Surgeon: Aviva Signs, MD;  Location: [facility];  Service: General;  Laterality: Left;  Marland Kitchen VASCULAR SURGERY Right 2008   stent to right common iliac artery    SOCIAL HISTORY: Social History   Social History  . Marital status: Widowed    Spouse name: N/A  . Number of children: N/A  . Years of education: N/A   Occupational History  . Not on file.   Social History Main Topics  . Smoking status: Current Every Day Smoker    Packs/day: 0.00    Years: 12.00    Types: Cigars  . Smokeless tobacco: Never Used     Comment: 5-7 cigars daily  . Alcohol use No  . Drug use: No  . Sexual activity: Not Currently   Other Topics Concern  . Not on file   Social History Narrative  . No narrative on file  Patient lives alone.  He has 2 daughters; one lives close to him and one lives in [place]. He has four grandchildren.  He smokes about 5 cigars day. He used to smoke cigarettes and chew tobacco when he was young. 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  FAMILY HISTORY: Family History  Problem Relation Age of Onset  . Heart failure Mother   . Coronary artery disease Father   . Stroke Father   . Alcoholism Father   . Heart disease Brother     MI  4 sisters; brother died from massive heart attack  Mother deceased at 30  Father deceased at 50 from massive heart attack.  ALLERGIES:  has No Known Allergies.  MEDICATIONS:  Current Outpatient Prescriptions  Medication Sig Dispense Refill  . albuterol (PROVENTIL HFA;VENTOLIN HFA) 108 (90 Base) MCG/ACT inhaler Inhale 1 puff into the lungs every 6 (six) hours as needed for wheezing or shortness of breath.    . Choline Fenofibrate (TRILIPIX) 135 MG capsule Take 135 mg by mouth daily.     Marland Kitchen  CISPLATIN IV Inject into the vein.    Marland Kitchen esomeprazole (NEXIUM) 40 MG capsule Take 1 capsule (40 mg total) by mouth daily at 12 noon. 60 capsule 1  . fentaNYL (DURAGESIC - DOSED MCG/HR) 25 MCG/HR patch Place 1 patch (25 mcg total) onto the skin every 3 (three) days. 10 patch 0  . HYDROcodone-acetaminophen (NORCO) 10-325 MG tablet TAKE 1 TABLET BY MOUTH EVERY FOUR HOURS AS NEEDED FOR MODERATE PAIN 60 tablet 0  . LORazepam (ATIVAN) 1 MG tablet Take 1 mg by mouth at bedtime.     . magic mouthwash w/lidocaine SOLN Take 10 mLs by mouth 4 (four) times daily as needed for mouth pain. Swish and swallow 90m four times a day for mouth pain/soreness. 360 mL 2  . pantoprazole (PROTONIX) 40 MG tablet Take 40 mg by mouth 2 (two) times daily as needed.  12  . potassium chloride 20 MEQ/15ML (10%) SOLN Take 30 mLs (40 mEq total) by mouth daily. 450 mL 0   Current Facility-Administered Medications  Medication Dose Route Frequency Provider Last Rate Last Dose  . feeding supplement (OSMOLITE 1.5 CAL) liquid 237 mL  237 mL Per Tube Q24H Patrici Ranks, MD        Review of Systems  Constitutional: Negative.   HENT: Negative.   Eyes: Negative.   Respiratory: Negative.   Cardiovascular: Negative.   Gastrointestinal: Negative.   Genitourinary: Negative.   Musculoskeletal: Negative.   Skin: Negative.   Neurological: Negative.   Endo/Heme/Allergies: Negative.   Psychiatric/Behavioral: Negative.   All other systems reviewed and are negative. 14 point ROS was done and is otherwise as detailed above or in HPI   PHYSICAL EXAMINATION: ECOG PERFORMANCE STATUS: 1 - Symptomatic but completely ambulatory  Vitals:   10/26/16 0842  BP: (!) 144/72  Pulse: 98  Resp: 20  Temp: 97.7 F (36.5 C)   Filed Weights   10/26/16 0842  Weight: 147 lb 3.2 oz (66.8 kg)    Physical Exam  Constitutional: He is oriented to person, place, and time and well-developed, well-nourished, and in no distress.  HENT:  Head:  Normocephalic and atraumatic.  Nose: Nose normal.  Mouth/Throat: No oropharyngeal exudate.  Eyes: Conjunctivae and EOM are normal. Pupils are equal, round, and reactive to light. Right eye exhibits no discharge. Left eye exhibits no discharge. No scleral icterus.  Neck: Normal range of motion. Neck supple. No tracheal deviation present. No thyromegaly present.  No mass palpated  Cardiovascular: Normal rate, regular rhythm and normal heart sounds.  Exam reveals no gallop and no friction rub.   No murmur heard. Pulmonary/Chest: Effort normal and breath sounds normal. He has no wheezes. He has no rales.  Abdominal: Soft. Bowel sounds are normal. He exhibits no mass. There is no rebound and no guarding.  Peg tube in LUQ  Musculoskeletal: Normal range of motion. He exhibits no edema.  Lymphadenopathy:    He has no cervical adenopathy.    He has no axillary adenopathy.  Neurological: He is alert and oriented to person, place, and time. He has normal reflexes. No cranial nerve deficit. Gait normal. Coordination normal.  Skin: Skin is warm and dry. No rash noted.  Psychiatric: Mood, memory, affect and judgment normal.  Nursing note and vitals reviewed.  LABORATORY DATA:  I have reviewed the data as listed Results for TERREON, EKHOLM (MRN [id number]) as of 10/26/2016 09:13  Ref. Range 10/06/2016 10:57 10/26/2016 08:18  COMPREHENSIVE METABOLIC PANEL Unknown Rpt (A) Rpt (A)  Sodium Latest Ref Range: 135 - 145 mmol/L 133 (L) 134 (L)  Potassium Latest Ref Range: 3.5 - 5.1 mmol/L 3.7 4.0  Chloride Latest Ref Range: 101 - 111 mmol/L 97 (L) 97 (L)  CO2 Latest Ref Range: 22 - 32 mmol/L 29 27  Glucose Latest Ref Range: 65 - 99 mg/dL 116 (H) 134 (H)  BUN Latest Ref Range: 6 - 20 mg/dL 15 15  Creatinine Latest Ref Range: 0.61 - 1.24 mg/dL 1.25 (H) 1.20  Calcium Latest Ref Range: 8.9 - 10.3 mg/dL 9.8 10.1  Anion gap Latest Ref Range: 5 - '15  7 10  '$ Magnesium Latest Ref Range: 1.7 - 2.4 mg/dL 2.0 2.0   Alkaline Phosphatase Latest Ref Range: 38 - 126 U/L 84 73  Albumin Latest Ref Range: 3.5 - 5.0 g/dL 3.5 3.7  AST Latest Ref Range: 15 - 41 U/L  14 (L) 18  ALT Latest Ref Range: 17 - 63 U/L <5 (L) 10 (L)  Total Protein Latest Ref Range: 6.5 - 8.1 g/dL 7.4 7.4  Total Bilirubin Latest Ref Range: 0.3 - 1.2 mg/dL 0.3 0.5  EGFR (African American) Latest Ref Range: >60 mL/min >60 >60  EGFR (Non-African Amer.) Latest Ref Range: >60 mL/min 57 (L) 60 (L)  WBC Latest Ref Range: 4.0 - 10.5 K/uL 9.8 10.2  RBC Latest Ref Range: 4.22 - 5.81 MIL/uL 3.87 (L) 4.40  Hemoglobin Latest Ref Range: 13.0 - 17.0 g/dL 53.6 (L) 64.4  HCT Latest Ref Range: 39.0 - 52.0 % 35.9 (L) 40.4  MCV Latest Ref Range: 78.0 - 100.0 fL 92.8 91.8  MCH Latest Ref Range: 26.0 - 34.0 pg 31.0 30.7  MCHC Latest Ref Range: 30.0 - 36.0 g/dL 03.4 74.2  RDW Latest Ref Range: 11.5 - 15.5 % 17.9 (H) 15.9 (H)  Platelets Latest Ref Range: 150 - 400 K/uL 242 225  Neutrophils Latest Units: % 72 67  Lymphocytes Latest Units: % 16 19  Monocytes Relative Latest Units: % 7 9  Eosinophil Latest Units: % 6 4  Basophil Latest Units: % 1 1  NEUT# Latest Ref Range: 1.7 - 7.7 K/uL 7.0 6.9  Lymphocyte # Latest Ref Range: 0.7 - 4.0 K/uL 1.5 1.9  Monocyte # Latest Ref Range: 0.1 - 1.0 K/uL 0.7 0.9  Eosinophils Absolute Latest Ref Range: 0.0 - 0.7 K/uL 0.6 0.5  Basophils Absolute Latest Ref Range: 0.0 - 0.1 K/uL 0.1 0.1    RADIOGRAPHIC STUDIES: I have personally reviewed the radiological images as listed and agreed with the findings in the report. No results found. Study Result   CLINICAL DATA:  [place]  EXAM: CHEST  2 VIEW  COMPARISON:  08/21/2016  FINDINGS: Left Port-A-Cath remains in place, unchanged. Interstitial prominence throughout the lungs, stable. Heart is normal size. Tortuosity of the thoracic aorta. No effusions. No acute bony abnormality.  IMPRESSION: Stable interstitial prominence.   Electronically Signed   By:  Charlett Nose M.D.   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 I will defer any imaging to them for restaging.  Advised him to increase his nutritional supplements to Ensure TID to help in maintaining weight. Once weight stabilizes and patient is doing better, will remove PEG. Refilled Norco and fentanyl patch. Added nexium for indigestion.  He will return for follow up in 6 weeks with CBC, CMP.  Orders Placed This Encounter  Procedures  . CBC with Differential    Standing Status:   Future    Standing Expiration Date:   10/26/2017  . Comprehensive metabolic panel    Standing Status:   Future    Standing Expiration Date:   10/26/2017   They will be notified of lab results when available.   This document serves as a record of services personally performed by Ralene Cork, MD. It was created on her behalf by [place] Casey, a trained medical scribe. The creation of this record is based on the scribe's personal observations and the provider's statements to them. This document has been checked and approved by the attending provider.  I have reviewed the above documentation for accuracy and completeness, and I agree  with the above.  This note was electronically signed.    Twana First, MD  10/26/2016 10:51 AM

## 2016-10-26 NOTE — Patient Instructions (Signed)
[place] at [facility] Discharge Instructions  RECOMMENDATIONS MADE BY THE CONSULTANT AND ANY TEST RESULTS WILL BE SENT TO YOUR REFERRING PHYSICIAN.  You were seen today by Dr. Talbert Cage. Follow up and labs in 6 weeks.  Thank you for choosing [place] at [facility] to provide your oncology and hematology care.  To afford each patient quality time with our provider, please arrive at least 15 minutes before your scheduled appointment time.    If you have a lab appointment with the [facility] please come in thru the  Main Entrance and check in at the main information desk  You need to re-schedule your appointment should you arrive 10 or more minutes late.  We strive to give you quality time with our providers, and arriving late affects you and other patients whose appointments are after yours.  Also, if you no show three or more times for appointments you may be dismissed from the clinic at the providers discretion.     Again, thank you for choosing [facility]  Our hope is that these requests will decrease the amount of time that you wait before being seen by our physicians.       _____________________________________________________________  Should you have questions after your visit to [facility], please contact our office at [phone] between the hours of 8:30 a.m. and 4:30 p.m.  Voicemails left after 4:30 p.m. will not be returned until the following business day.  For prescription refill requests, have your pharmacy contact our office.       Resources For Cancer Patients and their Caregivers ? American Cancer Society: Can assist with transportation, wigs, general needs, runs Look Good Feel Better.        330-777-8413 ? Cancer Care: Provides financial assistance, online support groups, medication/co-pay assistance.  1-800-813-HOPE 202-083-4139) ? [place] Assists [place]  cancer patients and their families through emotional , educational and financial support.  445-102-7823 ? [place] DSS Where to apply for food stamps, Medicaid and utility assistance. 939-111-3739 ? RCATS: Transportation to medical appointments. 515-840-7574 ? Social Security Administration: May apply for disability if have a Stage IV cancer. 308-139-3173 734-853-3919 ? [employer], Disability and Transit Services: Assists with nutrition, care and transit needs. [place] Support Programs: @10RELATIVEDAYS @ > Cancer Support Group  2nd Tuesday of the month 1pm-2pm, Journey Room  > Creative Journey  3rd Tuesday of the month 1130am-1pm, Journey Room  > Look Good Feel Better  1st Wednesday of the month 10am-12 noon, Journey Room (Call [place] to register [phone])

## 2016-11-10 ENCOUNTER — Other Ambulatory Visit (HOSPITAL_COMMUNITY): Payer: Self-pay | Admitting: Hematology & Oncology

## 2016-11-15 ENCOUNTER — Ambulatory Visit (HOSPITAL_COMMUNITY)
Admission: RE | Admit: 2016-11-15 | Discharge: 2016-11-15 | Disposition: A | Discharge status: Home / Self Care | Payer: Medicare Other | Source: Ambulatory Visit | Attending: Radiation Oncology | Admitting: Radiation Oncology

## 2016-11-15 DIAGNOSIS — I2510 Atherosclerotic heart disease of native coronary artery without angina pectoris: Secondary | ICD-10-CM | POA: Diagnosis not present

## 2016-11-15 DIAGNOSIS — C321 Malignant neoplasm of supraglottis: Secondary | ICD-10-CM | POA: Insufficient documentation

## 2016-11-15 DIAGNOSIS — C787 Secondary malignant neoplasm of liver and intrahepatic bile duct: Secondary | ICD-10-CM | POA: Insufficient documentation

## 2016-11-15 DIAGNOSIS — I700 Atherosclerosis of aorta: Secondary | ICD-10-CM | POA: Diagnosis not present

## 2016-11-15 LAB — GLUCOSE, CAPILLARY: GLUCOSE-CAPILLARY: 108 mg/dL — AB (ref 65–99)

## 2016-11-15 MED ORDER — FLUDEOXYGLUCOSE F - 18 (FDG) INJECTION: 7.6000 | Freq: Once | INTRAVENOUS | Status: DC | PRN

## 2016-11-18 ENCOUNTER — Telehealth (HOSPITAL_COMMUNITY): Payer: Self-pay | Admitting: Emergency Medicine

## 2016-11-18 NOTE — Telephone Encounter (Signed)
[place] called to let me know the pts PET scan showed new liver mets.  RAD ONC is going to cancel the appt until he has seen MED ONC again and a plan is made.  Spoke with Dr Talbert Cage and she wants to see him soon.  appt made for 11/21/2016 at 11:50 am.  Called pt and explained.  Pt verbalized understanding.

## 2016-11-21 ENCOUNTER — Encounter (HOSPITAL_COMMUNITY): Payer: Medicare Other | Attending: Hematology & Oncology | Admitting: Oncology

## 2016-11-21 ENCOUNTER — Encounter (HOSPITAL_COMMUNITY): Payer: Self-pay

## 2016-11-21 VITALS — BP 131/71 | HR 85 | Temp 98.2°F | Resp 18 | Wt 143.8 lb

## 2016-11-21 DIAGNOSIS — E279 Disorder of adrenal gland, unspecified: Secondary | ICD-10-CM | POA: Diagnosis not present

## 2016-11-21 DIAGNOSIS — C321 Malignant neoplasm of supraglottis: Secondary | ICD-10-CM | POA: Insufficient documentation

## 2016-11-21 DIAGNOSIS — K769 Liver disease, unspecified: Secondary | ICD-10-CM | POA: Diagnosis not present

## 2016-11-21 DIAGNOSIS — Z716 Tobacco abuse counseling: Secondary | ICD-10-CM

## 2016-11-21 DIAGNOSIS — R634 Abnormal weight loss: Secondary | ICD-10-CM | POA: Diagnosis not present

## 2016-11-21 DIAGNOSIS — C329 Malignant neoplasm of larynx, unspecified: Secondary | ICD-10-CM | POA: Diagnosis not present

## 2016-11-21 MED ORDER — MEGESTROL ACETATE 20 MG PO TABS: 20.0000 mg | ORAL_TABLET | Freq: Every day | ORAL | 1 refills | Status: AC

## 2016-11-21 NOTE — Patient Instructions (Signed)
[place] at [facility] Discharge Instructions  RECOMMENDATIONS MADE BY THE CONSULTANT AND ANY TEST RESULTS WILL BE SENT TO YOUR REFERRING PHYSICIAN.  You were seen today by Dr. Twana First We will get you an appointment to have the liver mass biopsied Follow up in 2 weeks See [name] up front for appointments   Thank you for choosing [place] at [facility] to provide your oncology and hematology care.  To afford each patient quality time with our provider, please arrive at least 15 minutes before your scheduled appointment time.    If you have a lab appointment with the [facility] please come in thru the  Main Entrance and check in at the main information desk  You need to re-schedule your appointment should you arrive 10 or more minutes late.  We strive to give you quality time with our providers, and arriving late affects you and other patients whose appointments are after yours.  Also, if you no show three or more times for appointments you may be dismissed from the clinic at the providers discretion.     Again, thank you for choosing [facility].  Our hope is that these requests will decrease the amount of time that you wait before being seen by our physicians.       _____________________________________________________________  Should you have questions after your visit to [facility], please contact our office at [phone] between the hours of 8:30 a.m. and 4:30 p.m.  Voicemails left after 4:30 p.m. will not be returned until the following business day.  For prescription refill requests, have your pharmacy contact our office.       Resources For Cancer Patients and their Caregivers ? American Cancer Society: Can assist with transportation, wigs, general needs, runs Look Good Feel Better.        ([phone] ? Cancer Care: Provides financial assistance, online support groups, medication/co-pay  assistance.  1-800-813-HOPE 636-849-4229) ? [place] Assists [place] cancer patients and their families through emotional , educational and financial support.  203-049-2112 ? [place] DSS Where to apply for food stamps, Medicaid and utility assistance. (430) 104-9667 ? RCATS: Transportation to medical appointments. 930-277-8631 ? Social Security Administration: May apply for disability if have a Stage IV cancer. 9860931665 5640795486 ? [employer], Disability and Transit Services: Assists with nutrition, care and transit needs. [place] Support Programs: @10RELATIVEDAYS @ > Cancer Support Group  2nd Tuesday of the month 1pm-2pm, Journey Room  > Creative Journey  3rd Tuesday of the month 1130am-1pm, Journey Room  > Look Good Feel Better  1st Wednesday of the month 10am-12 noon, Journey Room (Call [place] to register [phone])

## 2016-11-21 NOTE — Progress Notes (Signed)
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 Benjamine Mola      05/18/2016 Pathology Results    Diagnosis 1. Larynx, biopsy, Right aryepiglottic - INVASIVE POORLY DIFFERENTIATED CARCINOMA. - SEE COMMENT. 2. Larynx, biopsy, Left Aryepiglottic Fold - INVASIVE POORLY      06/14/2016 PET scan    1. Left-sided primary centered at the piriform sinus and aryepiglottic fold. 2. Bilateral cervical nodal metastasis. 3. No extracervical metastatic disease identified. 4. Bilateral pulmonary hypermetabolism is suspicious for developing interstitial lung disease (likely nonspecific interstitial pneumonia) superimposed upon centrilobular and paraseptal emphysema. Infectious process is felt less likely. 5. Hypermetabolic focus in the left side of the prostate is nonspecific. Consider correlation with PSA level and physical exam. 6. Right latissimus muscular hypermetabolism is likely incidental but warrants followup attention.      06/27/2016 Procedure     Port-A-Cath insertion, EGD with PEG placement by Dr. Arnoldo Morale      07/01/2016 - 08/12/2016 Chemotherapy    The patient had palonosetron (ALOXI) injection 0.25 mg, 0.25 mg, Intravenous,  Once, 3 of 3 cycles  CISplatin (PLATINOL) 191 mg in sodium chloride 0.9 % 500 mL chemo infusion, 100 mg/m2 = 191 mg, Intravenous,  Once, 3 of 3 cycles  fosaprepitant (EMEND) 150 mg, dexamethasone (DECADRON) 12 mg in sodium chloride 0.9 % 145 mL IVPB, , Intravenous,  Once, 3 of 3 cycles  for chemotherapy treatment.        07/01/2016 - 09/01/2016 Radiation Therapy         08/21/2016 - 08/26/2016 Hospital Admission    Admit date:  08/21/2016 Admission diagnosis: AKI Additional comments: Reported to ED with chest pain.      11/15/2016 PET scan    IMPRESSION: 1. New hypermetabolic metastatic disease in the liver and right adrenal gland. No residual abnormal hypermetabolism in the neck. 2. Aortic atherosclerosis (ICD10-170.0). Coronary artery calcification. 3. Focal hypermetabolism along the left aspect of the prostate, indeterminate. Malignancy cannot be excluded.       HISTORY OF PRESENTING ILLNESS:  Bruce [name] 71 y.o. male is here for a follow up of bilateral laryngeal carcinoma. He has certainly had challenges throughout therapy.  Bruce [name] returns to the [facility] today for continuing follow up with his daughter. He states that he feels fatigued and has decreased appetite. He still uses his PEG tube.       MEDICAL HISTORY:  Past Medical History:  Diagnosis Date  . AAA (abdominal aortic aneurysm) ([place])   . Anxiety   . Cancer (HCC)    laryngeal  . 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 Carpal tunnel syndrome   . Cellulitis   . COPD (chronic obstructive pulmonary disease) ([place])   . Epigastric hernia   . GERD (gastroesophageal reflux disease)   . Hyperlipidemia   . Hypertension     used to take amlodipine, none now    SURGICAL HISTORY: Past Surgical History:  Procedure Laterality Date  . APPENDECTOMY    . DIRECT LARYNGOSCOPY N/A 05/16/2016   Procedure: MICRO DIRECT LARYNGOSCOPY WITH BIOPSY;  Surgeon: Leta Baptist, MD;  Location: [facility];  Service: ENT;  Laterality: N/A;  .  ESOPHAGOGASTRODUODENOSCOPY N/A 06/27/2016   Procedure: ESOPHAGOGASTRODUODENOSCOPY (EGD);  Surgeon: Aviva Signs, MD;  Location: [facility];  Service: General;  Laterality: N/A;  . HEMORROIDECTOMY    . IR GENERIC HISTORICAL  07/14/2016   IR RADIOLOGIST EVAL & MGMT [facility]  . IR GENERIC HISTORICAL  07/14/2016   IR PATIENT EVAL TECH 0-60 MINS [facility]  RAD  . PEG PLACEMENT N/A 06/27/2016   Procedure: PERCUTANEOUS ENDOSCOPIC GASTROSTOMY (PEG) PLACEMENT;  Surgeon: Aviva Signs, MD;  Location: [facility];  Service: General;  Laterality: N/A;  . PORTACATH PLACEMENT Left 06/27/2016   Procedure: INSERTION PORT-A-CATH;  Surgeon: Aviva Signs, MD;  Location: [facility];  Service: General;  Laterality: Left;  Marland Kitchen VASCULAR SURGERY Right 2008   stent to right common iliac artery    SOCIAL HISTORY: Social History   Social History  . Marital status: Widowed    Spouse name: N/A  . Number of children: N/A  . Years of education: N/A   Occupational History  . Not on file.   Social History Main Topics  . Smoking status: Current Every Day Smoker    Packs/day: 0.00    Years: 12.00    Types: Cigars  . Smokeless tobacco: Never Used     Comment: 5-7 cigars daily  . Alcohol use No  . Drug use: No  . Sexual activity: Not Currently   Other Topics Concern  . Not on file   Social History Narrative  . No narrative on file  Patient lives alone.  He has 2 daughters; one lives close to him and one lives in [place]. He has four grandchildren.  He smokes about 5 cigars day. He used to smoke cigarettes and chew tobacco when he was young. 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  FAMILY HISTORY: Family History  Problem Relation Age of Onset  . Heart failure Mother   . Coronary artery disease Father   . Stroke Father   . Alcoholism Father   . Heart disease Brother     MI  4 sisters; brother died from massive heart attack  Mother deceased at 84  Father deceased at 84 from massive heart attack.  ALLERGIES:  has No Known Allergies.  MEDICATIONS:  Current Outpatient Prescriptions  Medication Sig Dispense Refill  . albuterol (PROVENTIL HFA;VENTOLIN HFA) 108 (90 Base) MCG/ACT inhaler Inhale 1 puff into the lungs every 6 (six) hours as needed for wheezing or shortness of breath.    . Choline Fenofibrate (TRILIPIX)  135 MG capsule Take 135 mg by mouth daily.     Marland Kitchen CISPLATIN IV Inject into the vein.    Marland Kitchen esomeprazole (NEXIUM) 40 MG capsule Take 1 capsule (40 mg total) by mouth daily at 12 noon. 60 capsule 1  . fentaNYL (DURAGESIC - DOSED MCG/HR) 25 MCG/HR patch Place 1 patch (25 mcg total) onto the skin every 3 (three) days. 10 patch 0  . HYDROcodone-acetaminophen (NORCO) 10-325 MG tablet TAKE 1 TABLET BY MOUTH EVERY FOUR HOURS AS NEEDED FOR MODERATE PAIN 60 tablet 0  . LORazepam (ATIVAN) 1 MG tablet Take 1 mg by mouth at bedtime.     . magic mouthwash w/lidocaine SOLN Take 10 mLs by mouth 4 (four) times daily as needed for mouth pain. Swish and swallow 70m four times a day for mouth pain/soreness. 360 mL 2  . ondansetron (ZOFRAN) 8 MG tablet TAKE 1 TABLET BY MOUTH EVERY 8 HOURS AS NEEDED FOR NAUSEA OR VOMITING. 30 tablet  2  . pantoprazole (PROTONIX) 40 MG tablet Take 40 mg by mouth 2 (two) times daily as needed.   12  . potassium chloride 20 MEQ/15ML (10%) SOLN Take 30 mLs (40 mEq total) by mouth daily. 450 mL 0   Current Facility-Administered Medications  Medication Dose Route Frequency Provider Last Rate Last Dose  . feeding supplement (OSMOLITE 1.5 CAL) liquid 237 mL  237 mL Per Tube Q24H Patrici Ranks, MD        Review of Systems  Constitutional: Negative.   HENT: Negative.   Eyes: Negative.   Respiratory: Negative.   Cardiovascular: Negative.   Gastrointestinal: Negative.   Genitourinary: Negative.   Musculoskeletal: Negative.   Skin: Negative.   Neurological: Negative.   Endo/Heme/Allergies: Negative.   Psychiatric/Behavioral: Negative.   All other systems reviewed and are negative. 14 point ROS was done and is otherwise as detailed above or in HPI   PHYSICAL EXAMINATION: ECOG PERFORMANCE STATUS: 1 - Symptomatic but completely ambulatory  Vitals:   11/21/16 1203  BP: 131/71  Pulse: 85  Resp: 18  Temp: 98.2 F (36.8 C)    Physical Exam  Constitutional: He is oriented to  person, place, and time and well-developed, well-nourished, and in no distress.  HENT:  Head: Normocephalic and atraumatic.  Nose: Nose normal.  Mouth/Throat: No oropharyngeal exudate.  Eyes: Conjunctivae and EOM are normal. Pupils are equal, round, and reactive to light. Right eye exhibits no discharge. Left eye exhibits no discharge. No scleral icterus.  Neck: Normal range of motion. Neck supple. No tracheal deviation present. No thyromegaly present.  No mass palpated  Cardiovascular: Normal rate, regular rhythm and normal heart sounds.  Exam reveals no gallop and no friction rub.   No murmur heard. Pulmonary/Chest: Effort normal and breath sounds normal. He has no wheezes. He has no rales.  Abdominal: Soft. Bowel sounds are normal. He exhibits no mass. There is no rebound and no guarding.  Peg tube in LUQ  Musculoskeletal: Normal range of motion. He exhibits no edema.  Lymphadenopathy:    He has no cervical adenopathy.    He has no axillary adenopathy.  Neurological: He is alert and oriented to person, place, and time. He has normal reflexes. No cranial nerve deficit. Gait normal. Coordination normal.  Skin: Skin is warm and dry. No rash noted.  Psychiatric: Mood, memory, affect and judgment normal.  Nursing note and vitals reviewed.  LABORATORY DATA:  I have reviewed the data as listed Results for TAYM, TWIST (MRN [id number]) as of 10/26/2016 09:13  Ref. Range 10/06/2016 10:57 10/26/2016 08:18  COMPREHENSIVE METABOLIC PANEL Unknown Rpt (A) Rpt (A)  Sodium Latest Ref Range: 135 - 145 mmol/L 133 (L) 134 (L)  Potassium Latest Ref Range: 3.5 - 5.1 mmol/L 3.7 4.0  Chloride Latest Ref Range: 101 - 111 mmol/L 97 (L) 97 (L)  CO2 Latest Ref Range: 22 - 32 mmol/L 29 27  Glucose Latest Ref Range: 65 - 99 mg/dL 116 (H) 134 (H)  BUN Latest Ref Range: 6 - 20 mg/dL 15 15  Creatinine Latest Ref Range: 0.61 - 1.24 mg/dL 1.25 (H) 1.20  Calcium Latest Ref Range: 8.9 - 10.3 mg/dL 9.8 10.1  Anion  gap Latest Ref Range: 5 - '15  7 10  '$ Magnesium Latest Ref Range: 1.7 - 2.4 mg/dL 2.0 2.0  Alkaline Phosphatase Latest Ref Range: 38 - 126 U/L 84 73  Albumin Latest Ref Range: 3.5 - 5.0 g/dL 3.5 3.7  AST Latest Ref Range:  15 - 41 U/L 14 (L) 18  ALT Latest Ref Range: 17 - 63 U/L <5 (L) 10 (L)  Total Protein Latest Ref Range: 6.5 - 8.1 g/dL 7.4 7.4  Total Bilirubin Latest Ref Range: 0.3 - 1.2 mg/dL 0.3 0.5  EGFR (African American) Latest Ref Range: >60 mL/min >60 >60  EGFR (Non-African Amer.) Latest Ref Range: >60 mL/min 57 (L) 60 (L)  WBC Latest Ref Range: 4.0 - 10.5 K/uL 9.8 10.2  RBC Latest Ref Range: 4.22 - 5.81 MIL/uL 3.87 (L) 4.40  Hemoglobin Latest Ref Range: 13.0 - 17.0 g/dL 42.6 (L) 27.0  HCT Latest Ref Range: 39.0 - 52.0 % 35.9 (L) 40.4  MCV Latest Ref Range: 78.0 - 100.0 fL 92.8 91.8  MCH Latest Ref Range: 26.0 - 34.0 pg 31.0 30.7  MCHC Latest Ref Range: 30.0 - 36.0 g/dL 04.8 49.8  RDW Latest Ref Range: 11.5 - 15.5 % 17.9 (H) 15.9 (H)  Platelets Latest Ref Range: 150 - 400 K/uL 242 225  Neutrophils Latest Units: % 72 67  Lymphocytes Latest Units: % 16 19  Monocytes Relative Latest Units: % 7 9  Eosinophil Latest Units: % 6 4  Basophil Latest Units: % 1 1  NEUT# Latest Ref Range: 1.7 - 7.7 K/uL 7.0 6.9  Lymphocyte # Latest Ref Range: 0.7 - 4.0 K/uL 1.5 1.9  Monocyte # Latest Ref Range: 0.1 - 1.0 K/uL 0.7 0.9  Eosinophils Absolute Latest Ref Range: 0.0 - 0.7 K/uL 0.6 0.5  Basophils Absolute Latest Ref Range: 0.0 - 0.1 K/uL 0.1 0.1    RADIOGRAPHIC STUDIES: I have personally reviewed the radiological images as listed and agreed with the findings in the report. No results found.   PET image restage 11/15/16 IMPRESSION: 1. New hypermetabolic metastatic disease in the liver and right adrenal gland. No residual abnormal hypermetabolism in the neck. 2. Aortic atherosclerosis (ICD10-170.0). Coronary artery calcification. 3. Focal hypermetabolism along the left aspect of the  prostate, indeterminate. Malignancy cannot be excluded.  PATHOLOGY:    Study Result  CLINICAL DATA:  Subsequent treatment strategy for supraglottic cancer.   EXAM: NUCLEAR MEDICINE PET SKULL BASE TO THIGH   TECHNIQUE: Some 0.5 mCi F-18 FDG was injected intravenously. Full-ring PET imaging was performed from the skull base to thigh after the radiotracer. CT data was obtained and used for attenuation correction and anatomic localization.   FASTING BLOOD GLUCOSE:  Value: 108 mg/dl   COMPARISON:  65/16/8610 and CT chest abdomen pelvis 03/02/2016.   FINDINGS: NECK   No hypermetabolic lymph nodes in the neck. CT images show no acute findings.   CHEST   No hypermetabolic mediastinal, hilar or axillary lymph nodes. Left sided Port-A-Cath terminates in the SVC. Atherosclerotic calcification of the arterial vasculature and coronary arteries. Heart size normal. No pericardial effusion. Centrilobular and paraseptal emphysema. A few scattered pulmonary nodular lesions are grossly stable and too small for PET resolution. Image quality is degraded by respiratory motion.   ABDOMEN/PELVIS   A somewhat ill-defined mass in the dome of the liver (right hepatic lobe) is new and measures 3.5 cm with an SUV max of 15.9. New right adrenal nodule measures 3.0 cm with an SUV max of 16.1. No abnormal hypermetabolism in the left adrenal gland, spleen or pancreas. No hypermetabolic lymph nodes. Focal metabolism is seen along the left lateral aspect of the prostate. Finally, hypermetabolism associated with a subcutaneous lesion in the left buttock is likely a sebaceous cyst. Liver, gallbladder, left adrenal gland otherwise unremarkable. Low-attenuation exophytic  lesion off the medial right kidney measures 2.8 cm and is likely a cyst but definitive characterization is limited without post-contrast imaging. Kidneys, spleen, pancreas, stomach and bowel are otherwise grossly unremarkable. Aorto  bi-iliac graft repair. Atherosclerotic calcification of the arterial vasculature.   SKELETON   No abnormal osseous hypermetabolism. Degenerative changes are seen in the spine.   IMPRESSION: 1. New hypermetabolic metastatic disease in the liver and right adrenal gland. No residual abnormal hypermetabolism in the neck. 2. Aortic atherosclerosis (ICD10-170.0). Coronary artery calcification. 3. Focal hypermetabolism along the left aspect of the prostate, indeterminate. Malignancy cannot be excluded.         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 New liver and right adrenal gland lesion concerning for metastatic disease. I will set patient up for IR to perform biopsy of liver lesion. Rx for megace for appetite stimulation for patient. Counseled on smoke cessation and discussed methods for smoke cessation.\ RTC in 2 weeks to review path report from liver biopsy and to discuss the next plan of care.   This document serves as a record of services personally performed by Twana First, MD. It was created on her behalf by Maryla Morrow, a trained medical scribe. The creation of this record is based on the scribe's personal observations and the provider's statements to them. This document has been checked and approved by the attending provider.  I have reviewed the above documentation for accuracy and completeness, and I agree with the above.  This note was electronically signed.    Maryla Morrow  11/21/2016 9:27 AM

## 2016-11-23 ENCOUNTER — Other Ambulatory Visit (HOSPITAL_COMMUNITY): Payer: Self-pay | Admitting: Oncology

## 2016-11-25 ENCOUNTER — Other Ambulatory Visit: Payer: Self-pay | Admitting: Radiology

## 2016-11-28 ENCOUNTER — Ambulatory Visit (HOSPITAL_COMMUNITY)
Admission: RE | Admit: 2016-11-28 | Discharge: 2016-11-28 | Disposition: A | Discharge status: Home / Self Care | Payer: Medicare Other | Source: Ambulatory Visit | Attending: Oncology | Admitting: Oncology

## 2016-11-28 ENCOUNTER — Encounter (HOSPITAL_COMMUNITY): Payer: Self-pay

## 2016-11-28 DIAGNOSIS — C787 Secondary malignant neoplasm of liver and intrahepatic bile duct: Secondary | ICD-10-CM | POA: Insufficient documentation

## 2016-11-28 DIAGNOSIS — I10 Essential (primary) hypertension: Secondary | ICD-10-CM | POA: Insufficient documentation

## 2016-11-28 DIAGNOSIS — F17210 Nicotine dependence, cigarettes, uncomplicated: Secondary | ICD-10-CM | POA: Diagnosis not present

## 2016-11-28 DIAGNOSIS — I714 Abdominal aortic aneurysm, without rupture: Secondary | ICD-10-CM | POA: Diagnosis not present

## 2016-11-28 DIAGNOSIS — C7971 Secondary malignant neoplasm of right adrenal gland: Secondary | ICD-10-CM | POA: Diagnosis not present

## 2016-11-28 DIAGNOSIS — C321 Malignant neoplasm of supraglottis: Secondary | ICD-10-CM | POA: Diagnosis present

## 2016-11-28 DIAGNOSIS — E785 Hyperlipidemia, unspecified: Secondary | ICD-10-CM | POA: Diagnosis not present

## 2016-11-28 DIAGNOSIS — Z923 Personal history of irradiation: Secondary | ICD-10-CM | POA: Insufficient documentation

## 2016-11-28 DIAGNOSIS — J449 Chronic obstructive pulmonary disease, unspecified: Secondary | ICD-10-CM | POA: Insufficient documentation

## 2016-11-28 DIAGNOSIS — G5600 Carpal tunnel syndrome, unspecified upper limb: Secondary | ICD-10-CM | POA: Diagnosis not present

## 2016-11-28 DIAGNOSIS — F419 Anxiety disorder, unspecified: Secondary | ICD-10-CM | POA: Diagnosis not present

## 2016-11-28 DIAGNOSIS — I700 Atherosclerosis of aorta: Secondary | ICD-10-CM | POA: Diagnosis not present

## 2016-11-28 DIAGNOSIS — K219 Gastro-esophageal reflux disease without esophagitis: Secondary | ICD-10-CM | POA: Insufficient documentation

## 2016-11-28 DIAGNOSIS — Z9221 Personal history of antineoplastic chemotherapy: Secondary | ICD-10-CM | POA: Insufficient documentation

## 2016-11-28 DIAGNOSIS — C329 Malignant neoplasm of larynx, unspecified: Secondary | ICD-10-CM | POA: Diagnosis not present

## 2016-11-28 LAB — CBC
HCT: 42.1 % (ref 39.0–52.0)
HEMOGLOBIN: 14.2 g/dL (ref 13.0–17.0)
MCH: 29.7 pg (ref 26.0–34.0)
MCHC: 33.7 g/dL (ref 30.0–36.0)
MCV: 88.1 fL (ref 78.0–100.0)
Platelets: 263 10*3/uL (ref 150–400)
RBC: 4.78 MIL/uL (ref 4.22–5.81)
RDW: 15.3 % (ref 11.5–15.5)
WBC: 12.7 10*3/uL — ABNORMAL HIGH (ref 4.0–10.5)

## 2016-11-28 LAB — APTT: aPTT: 33 seconds (ref 24–36)

## 2016-11-28 LAB — PROTIME-INR
INR: 1.04
PROTHROMBIN TIME: 13.6 s (ref 11.4–15.2)

## 2016-11-28 MED ORDER — SODIUM CHLORIDE 0.9 % IV SOLN
INTRAVENOUS | Status: DC
  Administered 2016-11-28: 12:00:00 via INTRAVENOUS

## 2016-11-28 MED ORDER — MIDAZOLAM HCL 2 MG/2ML IJ SOLN
INTRAMUSCULAR | Status: AC
  Filled 2016-11-28: qty 4

## 2016-11-28 MED ORDER — FENTANYL CITRATE (PF) 100 MCG/2ML IJ SOLN
INTRAMUSCULAR | Status: AC | PRN
  Administered 2016-11-28 (×2): 50 ug via INTRAVENOUS

## 2016-11-28 MED ORDER — FLUMAZENIL 0.5 MG/5ML IV SOLN
INTRAVENOUS | Status: AC
  Filled 2016-11-28: qty 5

## 2016-11-28 MED ORDER — FENTANYL CITRATE (PF) 100 MCG/2ML IJ SOLN
INTRAMUSCULAR | Status: AC
  Filled 2016-11-28: qty 4

## 2016-11-28 MED ORDER — NALOXONE HCL 0.4 MG/ML IJ SOLN
INTRAMUSCULAR | Status: AC
  Filled 2016-11-28: qty 1

## 2016-11-28 MED ORDER — MIDAZOLAM HCL 2 MG/2ML IJ SOLN
INTRAMUSCULAR | Status: AC | PRN
  Administered 2016-11-28 (×2): 1 mg via INTRAVENOUS

## 2016-11-28 NOTE — Consult Note (Signed)
Chief Complaint: Patient was seen in consultation today for image guided liver lesion biopsy  Referring Physician(s): Zhou,Louise  Supervising Physician: Arne Cleveland  Patient Status: [place]  History of Present Illness: Bruce [name] is a 71 y.o. male with bilateral laryngeal carcinoma diagnosed 04/2016, s/p chemoradiation. Recent PET reveals new hypermetabolic metastatic disease in the liver and right adrenal gland. He presents today for image guided liver lesion biopsy for further evaluation.   Past Medical History:  Diagnosis Date  . AAA (abdominal aortic aneurysm) ([place])   . Anxiety   . Cancer (HCC)    laryngeal  . 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 Carpal tunnel syndrome   . Cellulitis   . COPD (chronic obstructive pulmonary disease) ([place])   . Epigastric hernia   . GERD (gastroesophageal reflux disease)   . Hyperlipidemia   . Hypertension     used to take amlodipine, none now    Past Surgical History:  Procedure Laterality Date  . APPENDECTOMY    . DIRECT LARYNGOSCOPY N/A 05/16/2016   Procedure: MICRO DIRECT LARYNGOSCOPY WITH BIOPSY;  Surgeon: Leta Baptist, MD;  Location: [facility];  Service: ENT;  Laterality: N/A;  . ESOPHAGOGASTRODUODENOSCOPY N/A 06/27/2016   Procedure: ESOPHAGOGASTRODUODENOSCOPY (EGD);  Surgeon: Aviva Signs, MD;  Location: [facility];  Service: General;  Laterality: N/A;  . HEMORROIDECTOMY    . IR GENERIC HISTORICAL  07/14/2016   IR RADIOLOGIST EVAL & MGMT [facility]  . IR GENERIC HISTORICAL  07/14/2016   IR PATIENT EVAL TECH 0-60 MINS [facility]  . PEG PLACEMENT N/A 06/27/2016   Procedure: PERCUTANEOUS ENDOSCOPIC GASTROSTOMY (PEG) PLACEMENT;  Surgeon: Aviva Signs, MD;  Location: [facility];  Service: General;  Laterality: N/A;  . PORTACATH PLACEMENT Left 06/27/2016   Procedure: INSERTION PORT-A-CATH;  Surgeon: Aviva Signs, MD;  Location: [facility];  Service: General;   Laterality: Left;  [facility] Kitchen VASCULAR SURGERY Right 2008   stent to right common iliac artery    Allergies: Patient has no known allergies.  Medications: Prior to Admission medications   Medication Sig Start Date End Date Taking? Authorizing Provider  albuterol (PROVENTIL HFA;VENTOLIN HFA) 108 (90 Base) MCG/ACT inhaler Inhale 1 puff into the lungs every 6 (six) hours as needed for wheezing or shortness of breath.    Historical Provider, MD  Choline Fenofibrate (TRILIPIX) 135 MG capsule Take 135 mg by mouth daily.     Historical Provider, MD  CISPLATIN IV Inject into the vein.    Historical Provider, MD  esomeprazole (NEXIUM) 40 MG capsule Take 1 capsule (40 mg total) by mouth daily at 12 noon. 10/26/16   Twana First, MD  fentaNYL (DURAGESIC - DOSED MCG/HR) 25 MCG/HR patch Place 1 patch (25 mcg total) onto the skin every 3 (three) days. 10/26/16   Twana First, MD  HYDROcodone-acetaminophen (NORCO) 10-325 MG tablet TAKE 1 TABLET BY MOUTH EVERY FOUR HOURS AS NEEDED FOR MODERATE PAIN 10/26/16   Twana First, MD  LORazepam (ATIVAN) 1 MG tablet Take 1 mg by mouth at bedtime.     Historical Provider, MD  magic mouthwash w/lidocaine SOLN Take 10 mLs by mouth 4 (four) times daily as needed for mouth pain. Swish and swallow 71ml four times a day for mouth pain/soreness. 09/21/16   Patrici Ranks, MD  magic mouthwash w/lidocaine SOLN TAKE TWO TEASPOONSFUL BY MOUTH FOUR TIMES DAILY AS NEEDED FOR MOUTH PAIN/SORENESS 09/21/16   Historical Provider, MD  megestrol (MEGACE)  20 MG tablet Take 1 tablet (20 mg total) by mouth daily. 11/21/16   Twana First, MD  ondansetron (ZOFRAN) 8 MG tablet TAKE 1 TABLET BY MOUTH EVERY 8 HOURS AS NEEDED FOR NAUSEA OR VOMITING. 11/11/16   Holley Bouche, NP  pantoprazole (PROTONIX) 40 MG tablet Take 40 mg by mouth 2 (two) times daily as needed.  02/01/16   Historical Provider, MD  potassium chloride 20 MEQ/15ML (10%) SOLN Take 30 mLs (40 mEq total) by mouth daily. 08/27/16   Erline Hau, MD     Family History  Problem Relation Age of Onset  . Heart failure Mother   . Coronary artery disease Father   . Stroke Father   . Alcoholism Father   . Heart disease Brother     MI    Social History   Social History  . Marital status: Widowed    Spouse name: N/A  . Number of children: N/A  . Years of education: N/A   Social History Main Topics  . Smoking status: Current Every Day Smoker    Packs/day: 0.00    Years: 12.00    Types: Cigars  . Smokeless tobacco: Never Used     Comment: 5-7 cigars daily  . Alcohol use No  . Drug use: No  . Sexual activity: Not Currently   Other Topics Concern  . None   Social History Narrative  . None     Review of Systems Denies fever, headache, chest pain, dyspnea, cough, abdominal pain, nausea, vomiting or bleeding. He does have upper/ lower back pain.  Vital Signs: BP (!) 148/70 (BP Location: Right Arm)   Pulse 95   Temp 98.4 F (36.9 C) (Oral)   Resp 18   SpO2 100%   Physical Exam Awake, alert. Chest - CTA bilat; clean, intact left chest wall Port-A-Cath;heart- RRR; Abdomen soft, positive bowel sounds,NT;  intact left upper quadrant G-tube; lower extremities with no edema  Mallampati Score:     Imaging: Nm Pet Image Restag (ps) Skull Base To Thigh  Result Date: 11/15/2016 CLINICAL DATA:  Subsequent treatment strategy for supraglottic cancer. EXAM: NUCLEAR MEDICINE PET SKULL BASE TO THIGH TECHNIQUE: Some 0.5 mCi F-18 FDG was injected intravenously. Full-ring PET imaging was performed from the skull base to thigh after the radiotracer. CT data was obtained and used for attenuation correction and anatomic localization. FASTING BLOOD GLUCOSE:  Value: 108 mg/dl COMPARISON:  06/14/2016 and CT chest abdomen pelvis 03/02/2016. FINDINGS: NECK No hypermetabolic lymph nodes in the neck. CT images show no acute findings. CHEST No hypermetabolic mediastinal, hilar or axillary lymph nodes. Left sided Port-A-Cath  terminates in the SVC. Atherosclerotic calcification of the arterial vasculature and coronary arteries. Heart size normal. No pericardial effusion. Centrilobular and paraseptal emphysema. A few scattered pulmonary nodular lesions are grossly stable and too small for PET resolution. Image quality is degraded by respiratory motion. ABDOMEN/PELVIS A somewhat ill-defined mass in the dome of the liver (right hepatic lobe) is new and measures 3.5 cm with an SUV max of 15.9. New right adrenal nodule measures 3.0 cm with an SUV max of 16.1. No abnormal hypermetabolism in the left adrenal gland, spleen or pancreas. No hypermetabolic lymph nodes. Focal metabolism is seen along the left lateral aspect of the prostate. Finally, hypermetabolism associated with a subcutaneous lesion in the left buttock is likely a sebaceous cyst. Liver, gallbladder, left adrenal gland otherwise unremarkable. Low-attenuation exophytic lesion off the medial right kidney measures 2.8 cm and is  likely a cyst but definitive characterization is limited without post-contrast imaging. Kidneys, spleen, pancreas, stomach and bowel are otherwise grossly unremarkable. Aorto bi-iliac graft repair. Atherosclerotic calcification of the arterial vasculature. SKELETON No abnormal osseous hypermetabolism. Degenerative changes are seen in the spine. IMPRESSION: 1. New hypermetabolic metastatic disease in the liver and right adrenal gland. No residual abnormal hypermetabolism in the neck. 2. Aortic atherosclerosis (ICD10-170.0). Coronary artery calcification. 3. Focal hypermetabolism along the left aspect of the prostate, indeterminate. Malignancy cannot be excluded. Electronically Signed   By: Lorin Picket M.D.   On: 11/15/2016 10:18    Labs:  CBC:  Recent Labs  08/26/16 0430 09/21/16 0932 10/06/16 1057 10/26/16 0818  WBC 2.1* 11.5* 9.8 10.2  HGB 9.7* 11.6* 12.0* 13.5  HCT 29.9* 35.9* 35.9* 40.4  PLT 90* 220 242 225    COAGS: No results for  input(s): INR, APTT in the last 8760 hours.  BMP:  Recent Labs  08/26/16 0430 09/21/16 0932 10/06/16 1057 10/26/16 0818  NA 140 131* 133* 134*  K 3.3* 4.4 3.7 4.0  CL 104 95* 97* 97*  CO2 28 26 29 27   GLUCOSE 92 107* 116* 134*  BUN 27* 18 15 15   CALCIUM 7.8* 10.0 9.8 10.1  CREATININE 1.38* 1.32* 1.25* 1.20  GFRNONAA 50* 53* 57* 60*  GFRAA 58* >60 >60 >60    LIVER FUNCTION TESTS:  Recent Labs  08/22/16 0445 09/21/16 0932 10/06/16 1057 10/26/16 0818  BILITOT 0.4 0.6 0.3 0.5  AST 19 19 14* 18  ALT 31 13* <5* 10*  ALKPHOS 57 78 84 73  PROT 5.2* 7.7 7.4 7.4  ALBUMIN 2.3* 3.5 3.5 3.7    TUMOR MARKERS: No results for input(s): AFPTM, CEA, CA199, CHROMGRNA in the last 8760 hours.  Assessment and Plan: 71 y.o. male smoker with bilateral laryngeal carcinoma diagnosed 04/2016, s/p chemoradiation. Recent PET reveals new hypermetabolic metastatic disease in the liver and right adrenal gland. He presents today for image guided liver lesion biopsy for further evaluation.Risks and benefits discussed with the patient/daughter including, but not limited to bleeding, infection, damage to adjacent structures or low yield requiring additional tests.All of the patient's questions were answered, patient is agreeable to proceed.Consent signed and in chart.     Thank you for this interesting consult.  I greatly enjoyed meeting ZARIAH BARIS and look forward to participating in their care.  A copy of this report was sent to the requesting provider on this date.  Electronically Signed: D. Rowe Robert 11/28/2016, 12:17 PM   I spent a total of  25 minutes   in face to face in clinical consultation, greater than 50% of which was counseling/coordinating care for image guided liver lesion biopsy

## 2016-11-28 NOTE — Procedures (Signed)
[place] core liver lesion biopsy 18g x3 to surg path No complication No blood loss. See complete dictation in [facility].

## 2016-11-28 NOTE — Discharge Instructions (Signed)
Liver Biopsy, Care After °These instructions give you information on caring for yourself after your procedure. Your doctor may also give you more specific instructions. Call your doctor if you have any problems or questions after your procedure. °Follow these instructions at home: °· Rest at home for 1-2 days or as told by your doctor. °· Have someone stay with you for at least 24 hours. °· Do not do these things in the first 24 hours: °¨ Drive. °¨ Use machinery. °¨ Take care of other people. °¨ Sign legal documents. °¨ Take a bath or shower. °· There are many different ways to close and cover a cut (incision). For example, a cut can be closed with stitches, skin glue, or adhesive strips. Follow your doctor's instructions on: °¨ Taking care of your cut. °¨ Changing and removing your bandage (dressing). °¨ Removing whatever was used to close your cut. °· Do not drink alcohol in the first week. °· Do not lift more than 5 pounds or play contact sports for the first 2 weeks. °· Take medicines only as told by your doctor. For 1 week, do not take medicine that has aspirin in it or medicines like ibuprofen. °· Get your test results. °Contact a doctor if: °· A cut bleeds and leaves more than just a small spot of blood. °· A cut is red, puffs up (swells), or hurts more than before. °· Fluid or something else comes from a cut. °· A cut smells bad. °· You have a fever or chills. °Get help right away if: °· You have swelling, bloating, or pain in your belly (abdomen). °· You get dizzy or faint. °· You have a rash. °· You feel sick to your stomach (nauseous) or throw up (vomit). °· You have trouble breathing, feel short of breath, or feel faint. °· Your chest hurts. °· You have problems talking or seeing. °· You have trouble balancing or moving your arms or legs. °This information is not intended to replace advice given to you by your health care provider. Make sure you discuss any questions you have with your health care  provider. °Document Released: 06/21/2008 Document Revised: 02/18/2016 Document Reviewed: 11/08/2013 °Elsevier Interactive Patient Education © 2017 [employer] °Moderate Conscious Sedation, Adult, Care After °These instructions provide you with information about caring for yourself after your procedure. Your health care provider may also give you more specific instructions. Your treatment has been planned according to current medical practices, but problems sometimes occur. Call your health care provider if you have any problems or questions after your procedure. °What can I expect after the procedure? °After your procedure, it is common: °· To feel sleepy for several hours. °· To feel clumsy and have poor balance for several hours. °· To have poor judgment for several hours. °· To vomit if you eat too soon. °Follow these instructions at home: °For at least 24 hours after the procedure:  ° °· Do not: °¨ Participate in activities where you could fall or become injured. °¨ Drive. °¨ Use heavy machinery. °¨ Drink alcohol. °¨ Take sleeping pills or medicines that cause drowsiness. °¨ Make important decisions or sign legal documents. °¨ Take care of children on your own. °· Rest. °Eating and drinking  °· Follow the diet recommended by your health care provider. °· If you vomit: °¨ Drink water, juice, or soup when you can drink without vomiting. °¨ Make sure you have little or no nausea before eating solid foods. °General instructions  °· Have   a responsible adult stay with you until you are awake and alert. °· Take over-the-counter and prescription medicines only as told by your health care provider. °· If you smoke, do not smoke without supervision. °· Keep all follow-up visits as told by your health care provider. This is important. °Contact a health care provider if: °· You keep feeling nauseous or you keep vomiting. °· You feel light-headed. °· You develop a rash. °· You have a fever. °Get help right away if: °· You  have trouble breathing. °This information is not intended to replace advice given to you by your health care provider. Make sure you discuss any questions you have with your health care provider. °Document Released: 07/03/2013 Document Revised: 02/15/2016 Document Reviewed: 01/02/2016 °Elsevier Interactive Patient Education © 2017 [employer] ° °

## 2016-12-08 ENCOUNTER — Encounter (HOSPITAL_COMMUNITY): Payer: Medicare Other | Attending: Oncology | Admitting: Oncology

## 2016-12-08 ENCOUNTER — Encounter (HOSPITAL_COMMUNITY): Payer: Self-pay | Admitting: Lab

## 2016-12-08 ENCOUNTER — Encounter (HOSPITAL_COMMUNITY): Payer: Medicare Other | Attending: Hematology & Oncology

## 2016-12-08 ENCOUNTER — Encounter (HOSPITAL_COMMUNITY): Payer: Self-pay

## 2016-12-08 DIAGNOSIS — C321 Malignant neoplasm of supraglottis: Secondary | ICD-10-CM | POA: Diagnosis present

## 2016-12-08 DIAGNOSIS — K5900 Constipation, unspecified: Secondary | ICD-10-CM

## 2016-12-08 DIAGNOSIS — E876 Hypokalemia: Secondary | ICD-10-CM

## 2016-12-08 DIAGNOSIS — C787 Secondary malignant neoplasm of liver and intrahepatic bile duct: Secondary | ICD-10-CM | POA: Diagnosis not present

## 2016-12-08 DIAGNOSIS — G893 Neoplasm related pain (acute) (chronic): Secondary | ICD-10-CM

## 2016-12-08 DIAGNOSIS — R634 Abnormal weight loss: Secondary | ICD-10-CM

## 2016-12-08 LAB — CBC WITH DIFFERENTIAL/PLATELET
BASOS PCT: 1 %
Basophils Absolute: 0.1 10*3/uL (ref 0.0–0.1)
Eosinophils Absolute: 0.3 10*3/uL (ref 0.0–0.7)
Eosinophils Relative: 2 %
HCT: 38.9 % — ABNORMAL LOW (ref 39.0–52.0)
HEMOGLOBIN: 13.6 g/dL (ref 13.0–17.0)
Lymphocytes Relative: 18 %
Lymphs Abs: 2.3 10*3/uL (ref 0.7–4.0)
MCH: 30.4 pg (ref 26.0–34.0)
MCHC: 35 g/dL (ref 30.0–36.0)
MCV: 86.8 fL (ref 78.0–100.0)
MONO ABS: 1.2 10*3/uL — AB (ref 0.1–1.0)
MONOS PCT: 9 %
NEUTROS PCT: 70 %
Neutro Abs: 9.2 10*3/uL — ABNORMAL HIGH (ref 1.7–7.7)
PLATELETS: 290 10*3/uL (ref 150–400)
RBC: 4.48 MIL/uL (ref 4.22–5.81)
RDW: 14.9 % (ref 11.5–15.5)
WBC: 13.1 10*3/uL — ABNORMAL HIGH (ref 4.0–10.5)

## 2016-12-08 LAB — COMPREHENSIVE METABOLIC PANEL
ALBUMIN: 3.7 g/dL (ref 3.5–5.0)
ALT: 11 U/L — ABNORMAL LOW (ref 17–63)
ANION GAP: 9 (ref 5–15)
AST: 23 U/L (ref 15–41)
Alkaline Phosphatase: 92 U/L (ref 38–126)
BUN: 23 mg/dL — ABNORMAL HIGH (ref 6–20)
CHLORIDE: 101 mmol/L (ref 101–111)
CO2: 23 mmol/L (ref 22–32)
Calcium: 9.8 mg/dL (ref 8.9–10.3)
Creatinine, Ser: 1.18 mg/dL (ref 0.61–1.24)
GFR calc Af Amer: >60 mL/min (ref >60)
GFR calc non Af Amer: >60 mL/min (ref >60)
GLUCOSE: 95 mg/dL (ref 65–99)
POTASSIUM: 3.8 mmol/L (ref 3.5–5.1)
SODIUM: 133 mmol/L — AB (ref 135–145)
Total Bilirubin: 0.4 mg/dL (ref 0.3–1.2)
Total Protein: 7.6 g/dL (ref 6.5–8.1)

## 2016-12-08 MED ORDER — MAGIC MOUTHWASH W/LIDOCAINE: 10.0000 mL | Freq: Four times a day (QID) | ORAL | 2 refills | Status: DC | PRN

## 2016-12-08 MED ORDER — HYDROCODONE-ACETAMINOPHEN 7.5-325 MG/15ML PO SOLN: 10.0000 mL | Freq: Four times a day (QID) | ORAL | 0 refills | Status: AC | PRN

## 2016-12-08 MED ORDER — FENTANYL 25 MCG/HR TD PT72: 25.0000 ug | MEDICATED_PATCH | TRANSDERMAL | 0 refills | Status: AC

## 2016-12-08 NOTE — Progress Notes (Unsigned)
Referral sent to Hospice.  Records faxed on 3/15.

## 2016-12-08 NOTE — Progress Notes (Signed)
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 Benjamine Mola      05/18/2016 Pathology Results    Diagnosis 1. Larynx, biopsy, Right aryepiglottic - INVASIVE POORLY DIFFERENTIATED CARCINOMA. - SEE COMMENT. 2. Larynx, biopsy, Left Aryepiglottic Fold - INVASIVE POORLY      06/14/2016 PET scan    1. Left-sided primary centered at the piriform sinus and aryepiglottic fold. 2. Bilateral cervical nodal metastasis. 3. No extracervical metastatic disease identified. 4. Bilateral pulmonary hypermetabolism is suspicious for developing interstitial lung disease (likely nonspecific interstitial pneumonia) superimposed upon centrilobular and paraseptal emphysema. Infectious process is felt less likely. 5. Hypermetabolic focus in the left side of the prostate is nonspecific. Consider correlation with PSA level and physical exam. 6. Right latissimus muscular hypermetabolism is likely incidental but warrants followup attention.      06/27/2016 Procedure     Port-A-Cath insertion, EGD with PEG placement by Dr. Arnoldo Morale      07/01/2016 - 08/12/2016 Chemotherapy    The patient had palonosetron (ALOXI) injection 0.25 mg, 0.25 mg, Intravenous,  Once, 3 of 3 cycles  CISplatin (PLATINOL) 191 mg in sodium chloride 0.9 % 500 mL chemo infusion, 100 mg/m2 = 191 mg, Intravenous,  Once, 3 of 3 cycles  fosaprepitant (EMEND) 150 mg, dexamethasone (DECADRON) 12 mg in sodium chloride 0.9 % 145 mL IVPB, , Intravenous,  Once, 3 of 3 cycles  for chemotherapy treatment.        07/01/2016 - 09/01/2016 Radiation Therapy         08/21/2016 - 08/26/2016 Hospital Admission    Admit date:  08/21/2016 Admission diagnosis: AKI Additional comments: Reported to ED with chest pain.      11/15/2016 PET scan    IMPRESSION: 1. New hypermetabolic metastatic disease in the liver and right adrenal gland. No residual abnormal hypermetabolism in the neck. 2. Aortic atherosclerosis (ICD10-170.0). Coronary artery calcification. 3. Focal hypermetabolism along the left aspect of the prostate, indeterminate. Malignancy cannot be excluded.      11/28/2016 Pathology Results    US guided biopsy of liver mass Surgical path: poorly differentiated carcinoma      HISTORY OF PRESENTING ILLNESS:  Bruce [name] 71 y.o. male is here for a follow up of bilateral laryngeal carcinoma. He has certainly had challenges throughout therapy.  Mr. Slabach returns to the [facility] today accompanied by his daughter for continuing follow up.  He underwent an US guided biopsy of the right liver mass and surgical path demonstrated poorly differentiated carcinoma.  Patient feels like he's going "downhill". Fatigue is worse.   He is concerned about feeling a mass in his neck.  He states he is eating and maintaining his weight. He still uses his PEG tube. He notes he drinks Ensure and Boost.       MEDICAL HISTORY:  Past Medical History:  Diagnosis Date  . AAA (abdominal aortic aneurysm) ([place])   . Anxiety   . Cancer (HCC)    laryngeal  . 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 Carpal tunnel syndrome   . Cellulitis   . COPD (chronic obstructive pulmonary disease) ([place])   . Epigastric hernia   . GERD (  gastroesophageal reflux disease)   . Hyperlipidemia   . Hypertension     used to take amlodipine, none now    SURGICAL HISTORY: Past Surgical History:  Procedure Laterality Date  . APPENDECTOMY    . DIRECT LARYNGOSCOPY N/A 05/16/2016   Procedure: MICRO DIRECT LARYNGOSCOPY WITH BIOPSY;  Surgeon: Leta Baptist, MD;  Location: [facility];   Service: ENT;  Laterality: N/A;  . ESOPHAGOGASTRODUODENOSCOPY N/A 06/27/2016   Procedure: ESOPHAGOGASTRODUODENOSCOPY (EGD);  Surgeon: Aviva Signs, MD;  Location: [facility];  Service: General;  Laterality: N/A;  . HEMORROIDECTOMY    . IR GENERIC HISTORICAL  07/14/2016   IR RADIOLOGIST EVAL & MGMT [facility]  . IR GENERIC HISTORICAL  07/14/2016   IR PATIENT EVAL TECH 0-60 MINS [facility]  . PEG PLACEMENT N/A 06/27/2016   Procedure: PERCUTANEOUS ENDOSCOPIC GASTROSTOMY (PEG) PLACEMENT;  Surgeon: Aviva Signs, MD;  Location: [facility];  Service: General;  Laterality: N/A;  . PORTACATH PLACEMENT Left 06/27/2016   Procedure: INSERTION PORT-A-CATH;  Surgeon: Aviva Signs, MD;  Location: [facility];  Service: General;  Laterality: Left;  Marland Kitchen VASCULAR SURGERY Right 2008   stent to right common iliac artery    SOCIAL HISTORY: Social History   Social History  . Marital status: Widowed    Spouse name: N/A  . Number of children: N/A  . Years of education: N/A   Occupational History  . Not on file.   Social History Main Topics  . Smoking status: Current Every Day Smoker    Packs/day: 0.00    Years: 12.00    Types: Cigars  . Smokeless tobacco: Never Used     Comment: 5-7 cigars daily  . Alcohol use No  . Drug use: No  . Sexual activity: Not Currently   Other Topics Concern  . Not on file   Social History Narrative  . No narrative on file  Patient lives alone.  He has 2 daughters; one lives close to him and one lives in [place]. He has four grandchildren.  He smokes about 5 cigars day. He used to smoke cigarettes and chew tobacco when he was young. 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  FAMILY HISTORY: Family History  Problem Relation Age of Onset  . Heart failure Mother   . Coronary artery disease Father   . Stroke Father   . Alcoholism Father   . Heart disease Brother     MI  4 sisters; brother died from massive heart attack  Mother  deceased at 33  Father deceased at 71 from massive heart attack.  ALLERGIES:  has No Known Allergies.  MEDICATIONS:  Current Outpatient Prescriptions  Medication Sig Dispense Refill  . albuterol (PROVENTIL HFA;VENTOLIN HFA) 108 (90 Base) MCG/ACT inhaler Inhale 1 puff into the lungs every 6 (six) hours as needed for wheezing or shortness of breath.    . Choline Fenofibrate (TRILIPIX) 135 MG capsule Take 135 mg by mouth daily.     Marland Kitchen CISPLATIN IV Inject into the vein.    Marland Kitchen esomeprazole (NEXIUM) 40 MG capsule Take 1 capsule (40 mg total) by mouth daily at 12 noon. 60 capsule 1  . fentaNYL (DURAGESIC - DOSED MCG/HR) 25 MCG/HR patch Place 1 patch (25 mcg total) onto the skin every 3 (three) days. 10 patch 0  . HYDROcodone-acetaminophen (NORCO) 10-325 MG tablet TAKE 1 TABLET BY MOUTH EVERY FOUR HOURS AS NEEDED FOR MODERATE PAIN 60 tablet 0  . LORazepam (  ATIVAN) 1 MG tablet Take 1 mg by mouth at bedtime.     . magic mouthwash w/lidocaine SOLN Take 10 mLs by mouth 4 (four) times daily as needed for mouth pain. Swish and swallow 50m four times a day for mouth pain/soreness. 360 mL 2  . megestrol (MEGACE) 20 MG tablet Take 1 tablet (20 mg total) by mouth daily. 60 tablet 1  . ondansetron (ZOFRAN) 8 MG tablet TAKE 1 TABLET BY MOUTH EVERY 8 HOURS AS NEEDED FOR NAUSEA OR VOMITING. 30 tablet 2  . pantoprazole (PROTONIX) 40 MG tablet Take 40 mg by mouth 2 (two) times daily as needed.   12  . potassium chloride 20 MEQ/15ML (10%) SOLN Take 30 mLs (40 mEq total) by mouth daily. 450 mL 0   Current Facility-Administered Medications  Medication Dose Route Frequency Provider Last Rate Last Dose  . feeding supplement (OSMOLITE 1.5 CAL) liquid 237 mL  237 mL Per Tube Q24H SPatrici Ranks MD           Review of Systems  Constitutional: Negative.   HENT: Negative.        Throat edema "feels swollen"  Eyes: Negative.   Respiratory: Negative.   Cardiovascular: Negative.   Gastrointestinal: Negative.    Genitourinary: Negative.   Musculoskeletal: Negative.   Skin: Negative.   Neurological: Negative.   Endo/Heme/Allergies: Negative.   Psychiatric/Behavioral: Negative.   All other systems reviewed and are negative. 14 point ROS was done and is otherwise as detailed above or in HPI   PHYSICAL EXAMINATION: ECOG PERFORMANCE STATUS: 1 - Symptomatic but completely ambulatory  Vitals:   12/08/16 1451  BP: 138/78  Pulse: 81  Resp: 18  Temp: 98.2 F (36.8 C)   Filed Weights   12/08/16 1451  Weight: 144 lb (65.3 kg)     Physical Exam  Constitutional: He is oriented to person, place, and time and well-developed, well-nourished, and in no distress.  HENT:  Head: Normocephalic and atraumatic.  Nose: Nose normal.  Mouth/Throat: No oropharyngeal exudate.  Eyes: Conjunctivae and EOM are normal. Pupils are equal, round, and reactive to light. Right eye exhibits no discharge. Left eye exhibits no discharge. No scleral icterus.  Neck: Normal range of motion. Neck supple. No tracheal deviation present. No thyromegaly present.  No mass palpated in the neck.  Cardiovascular: Normal rate, regular rhythm and normal heart sounds.  Exam reveals no gallop and no friction rub.   No murmur heard. Pulmonary/Chest: Effort normal and breath sounds normal. He has no wheezes. He has no rales.  Abdominal: Soft. Bowel sounds are normal. He exhibits no mass. There is no rebound and no guarding.  Peg tube in LUQ  Musculoskeletal: Normal range of motion. He exhibits no edema.  Lymphadenopathy:    He has no cervical adenopathy.    He has no axillary adenopathy.  Neurological: He is alert and oriented to person, place, and time. He has normal reflexes. No cranial nerve deficit. Gait normal. Coordination normal.  Skin: Skin is warm and dry. No rash noted.  Psychiatric: Mood, memory, affect and judgment normal.  Nursing note and vitals reviewed.  LABORATORY DATA:  I have reviewed the data as  listed  Results for JSAINT, HANK(MRN [id number] as of 12/08/2016 13:05  Ref. Range 10/26/2016 08:18 11/28/2016 12:10  Sodium Latest Ref Range: 135 - 145 mmol/L 134 (L)   Potassium Latest Ref Range: 3.5 - 5.1 mmol/L 4.0   Chloride Latest Ref Range: 101 - 111 mmol/L 97 (  L)   CO2 Latest Ref Range: 22 - 32 mmol/L 27   Glucose Latest Ref Range: 65 - 99 mg/dL 134 (H)   BUN Latest Ref Range: 6 - 20 mg/dL 15   Creatinine Latest Ref Range: 0.61 - 1.24 mg/dL 1.20   Calcium Latest Ref Range: 8.9 - 10.3 mg/dL 10.1   Anion gap Latest Ref Range: 5 - 15  10   Magnesium Latest Ref Range: 1.7 - 2.4 mg/dL 2.0   Alkaline Phosphatase Latest Ref Range: 38 - 126 U/L 73   Albumin Latest Ref Range: 3.5 - 5.0 g/dL 3.7   AST Latest Ref Range: 15 - 41 U/L 18   ALT Latest Ref Range: 17 - 63 U/L 10 (L)   Total Protein Latest Ref Range: 6.5 - 8.1 g/dL 7.4   Total Bilirubin Latest Ref Range: 0.3 - 1.2 mg/dL 0.5   EGFR (African American) Latest Ref Range: >60 mL/min >60   EGFR (Non-African Amer.) Latest Ref Range: >60 mL/min 60 (L)   WBC Latest Ref Range: 4.0 - 10.5 K/uL 10.2 12.7 (H)  RBC Latest Ref Range: 4.22 - 5.81 MIL/uL 4.40 4.78  Hemoglobin Latest Ref Range: 13.0 - 17.0 g/dL 13.5 14.2  HCT Latest Ref Range: 39.0 - 52.0 % 40.4 42.1  MCV Latest Ref Range: 78.0 - 100.0 fL 91.8 88.1  MCH Latest Ref Range: 26.0 - 34.0 pg 30.7 29.7  MCHC Latest Ref Range: 30.0 - 36.0 g/dL 33.4 33.7  RDW Latest Ref Range: 11.5 - 15.5 % 15.9 (H) 15.3  Platelets Latest Ref Range: 150 - 400 K/uL 225 263  Neutrophils Latest Units: % 67   Lymphocytes Latest Units: % 19   Monocytes Relative Latest Units: % 9   Eosinophil Latest Units: % 4   Basophil Latest Units: % 1   NEUT# Latest Ref Range: 1.7 - 7.7 K/uL 6.9   Lymphocyte # Latest Ref Range: 0.7 - 4.0 K/uL 1.9   Monocyte # Latest Ref Range: 0.1 - 1.0 K/uL 0.9   Eosinophils Absolute Latest Ref Range: 0.0 - 0.7 K/uL 0.5   Basophils Absolute Latest Ref Range: 0.0 - 0.1 K/uL 0.1      RADIOGRAPHIC STUDIES: I have personally reviewed the radiological images as listed and agreed with the findings in the report. No results found.   PET image restage 11/15/16 IMPRESSION: 1. New hypermetabolic metastatic disease in the liver and right adrenal gland. No residual abnormal hypermetabolism in the neck. 2. Aortic atherosclerosis (ICD10-170.0). Coronary artery calcification. 3. Focal hypermetabolism along the left aspect of the prostate, indeterminate. Malignancy cannot be excluded.   ULTRASOUND GUIDED CORE BIOPSY OF LIVER LESION 11/28/2016  IMPRESSION: 1. Technically successful CT-guided core biopsy, liver lesion.   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  Patient now has biopsy proven metastatic cancer from his primary laryngeal cancer.  I have discussed with patient whether he would want palliative chemotherapy however he refused further treatment for his cancer.   Moving forward we will do comfort care and I have made a referral to home hospice.  I refilled his magic mouthwash and pain medicine including hycet and fentanyl patch.  RTC PRN.   This document serves as a record of services personally performed  by Twana First, MD. It was created on her behalf by Shirlean Mylar, a trained medical scribe. The creation of this record is based on the scribe's personal observations and the provider's statements to them. This document has been checked and approved by the attending provider.  I have reviewed the above documentation for accuracy and completeness, and I agree with the above.  This note was electronically signed.    [employer]  12/08/2016 2:58 PM

## 2016-12-08 NOTE — Patient Instructions (Signed)
[place] at [facility] Discharge Instructions  RECOMMENDATIONS MADE BY THE CONSULTANT AND ANY TEST RESULTS WILL BE SENT TO YOUR REFERRING PHYSICIAN.  You were seen today by Dr. Twana First We have referred you to Hospice, expect a phone call from them this afternoon or tomorrow at the latest Follow up as needed See [name] up front for appointments   Thank you for choosing [place] at [facility] to provide your oncology and hematology care.  To afford each patient quality time with our provider, please arrive at least 15 minutes before your scheduled appointment time.    If you have a lab appointment with the [facility] please come in thru the  Main Entrance and check in at the main information desk  You need to re-schedule your appointment should you arrive 10 or more minutes late.  We strive to give you quality time with our providers, and arriving late affects you and other patients whose appointments are after yours.  Also, if you no show three or more times for appointments you may be dismissed from the clinic at the providers discretion.     Again, thank you for choosing [facility].  Our hope is that these requests will decrease the amount of time that you wait before being seen by our physicians.       _____________________________________________________________  Should you have questions after your visit to [facility], please contact our office at [phone] between the hours of 8:30 a.m. and 4:30 p.m.  Voicemails left after 4:30 p.m. will not be returned until the following business day.  For prescription refill requests, have your pharmacy contact our office.       Resources For Cancer Patients and their Caregivers ? American Cancer Society: Can assist with transportation, wigs, general needs, runs Look Good Feel Better.        (610)536-1423 ? Cancer Care: Provides financial assistance,  online support groups, medication/co-pay assistance.  1-800-813-HOPE 317-773-3088) ? [place] Assists [place] cancer patients and their families through emotional , educational and financial support.  8173967742 ? [place] DSS Where to apply for food stamps, Medicaid and utility assistance. 425 706 6613 ? [facility]: Transportation to medical appointments. 270-465-0664 ? Social Security Administration: May apply for disability if have a Stage IV cancer. 226-348-1792 (204)057-2629 ? [employer], Disability and Transit Services: Assists with nutrition, care and transit needs. [place] Support Programs: @10RELATIVEDAYS @ > Cancer Support Group  2nd Tuesday of the month 1pm-2pm, Journey Room  > Creative Journey  3rd Tuesday of the month 1130am-1pm, Journey Room  > Look Good Feel Better  1st Wednesday of the month 10am-12 noon, Journey Room (Call [place] to register [phone])

## 2016-12-09 ENCOUNTER — Other Ambulatory Visit (HOSPITAL_COMMUNITY): Payer: Self-pay | Admitting: *Deleted

## 2016-12-09 MED ORDER — MAGIC MOUTHWASH: 10.0000 mL | Freq: Four times a day (QID) | ORAL | 2 refills | Status: AC | PRN

## 2016-12-14 ENCOUNTER — Telehealth (HOSPITAL_COMMUNITY): Payer: Self-pay | Admitting: Adult Health

## 2016-12-14 NOTE — Telephone Encounter (Signed)
Patient's daughter, Johnie Makki, called the cancer center with several questions and requested a return call from a provider.   I returned Ms. Boffa and was able to speak with her for about 30 minutes via phone regarding her concerns.  We discussed her dad's case, in regards to metastatic laryngeal cancer.  We reviewed how H&N cancer staging is different than other solid tumors.  She wanted to know if there were other options available to treat his metastatic disease. I shared with her that given a few areas of metastatic disease (biopsy-proven in the liver) as well as in the adrenal gland, that palliative chemotherapy would be the most appropriate treatment choice to address systemic sites of disease.  I reiterated that the cancer is no longer curable, but in general terms could be treatable. However, Mr. Waldman has been clear in his wishes not to pursue additional chemotherapy.  This was discussed with the patient and his daughter with Dr. Talbert Cage at a recent office visit and the patient elected to proceed with comfort care/home hospice.  I reiterated that clinically, hospice is a very reasonable option at this point.   We discussed what the goals of treatment would be: extend life, but with the possibility of frequent appointments at the cancer center for symptom management and the patient not feeling well vs symptom management at home with support of hospice. She had questions regarding his overall prognosis. I explained that, in general, patients who qualify for hospice have a life expectancy of 6 months or less.  Of course, we do not know for sure; I shared that there have been some studies that showed that patients with cancer often live longer than expected because they feel well and their comfort needs are met.   Ms. Trippe becomes emotional on the phone and states that it is very hard for her to accept.  I provided emotional support and acknowledgment. I reinforced that hospice is a wonderful organization  that truly cares for the patient and the family.  She again becomes emotional stating, "It's just so hard to have to let go."  I validated her feelings and offered support.    At the end of our conversation, she thanked me for giving her a call. I extended the offer for her to call our office with any other further questions; I think once hospice becomes involved, the family will have more comfort in going to them for questions/concerns.  During this transition, we are happy to provide support as needed.  Ms. Thompson provided appreciation.    We will cancel the office visit that was scheduled for next week with me to discuss these questions, which were addressed via phone today. She is welcome to call back anytime and I am happy to speak with her.  She agreed with this plan.    Mike Craze, NP [place] [phone]

## 2016-12-20 ENCOUNTER — Ambulatory Visit (HOSPITAL_COMMUNITY): Payer: Medicare Other | Admitting: Adult Health

## 2016-12-21 ENCOUNTER — Encounter: Payer: Self-pay | Admitting: Dietician

## 2016-12-21 NOTE — Progress Notes (Signed)
RD contacted daughter Abigail Butts regarding Tube feeding formula and provision of supplements.   Daughter reports that the patient is consuming most of his calories/protein by mouth. The daughters get 1 can of Osmolite 1.5 in the patient per day.Daughter says the patient will not agree to more.   It is unclear exactly what the daughter desires. She declined the free tube feeding and any oral supplements, stating they just purchase those for the patient and this is not a problem.   The daughter "felt the order should be changed" because she felt he did not need much tube feeding. She picks up a case of advanced home care every 2 weeks since he only takes 1 can per day.    From patients chart, it appears she has only 1 can ordered per day anyway.   Communicated to NP discussion. No discernible needs at this time.   [name] RD, LDN, CNSC Clinical Nutrition Pager: [phone] 12/21/2016 2:35 PM

## 2016-12-26 ENCOUNTER — Telehealth (HOSPITAL_COMMUNITY): Payer: Self-pay

## 2016-12-26 NOTE — Telephone Encounter (Signed)
Patients daughter, Joycelyn Rua,  called stating her son, patients grandson, is in the [employer] and is scheduled for deployment in 2 months. She was requesting a letter with patients prognosis and life expectancy time frame for her sons [place].  Faxed last note from [place], where she talked with another daughter, which outlined and answered Tammys questions. Explained to Tammy that if the CO needed a letter to just call back and Elzie Rings would dictate one. Tammy verbalized understanding.

## 2017-02-21 IMAGING — PT NM PET TUM IMG INITIAL (PI) SKULL BASE T - THIGH
1 of 8 series · 1 of 25 positions shown · non-contrast
Comparison: Chest abdomen and pelvic CT of 03/02/2016. No prior
PET.

CLINICAL DATA: Initial treatment strategy for left aryepiglottic
fold mass..

EXAM:
NUCLEAR MEDICINE PET SKULL BASE TO THIGH
TECHNIQUE: 8.4 mCi F-18 FDG was injected intravenously. Full-ring PET imaging
was performed from the skull base to thigh after the radiotracer. CT
data was obtained and used for attenuation correction and anatomic
localization.
FASTING BLOOD GLUCOSE:  Value: 107 mg/dl

[Series 4: ct hn_sk_th 5.0 b31f · axial · 5.0mm · 0.98mm/px · 1 of 232 slices shown]
[im 232/232  brain]
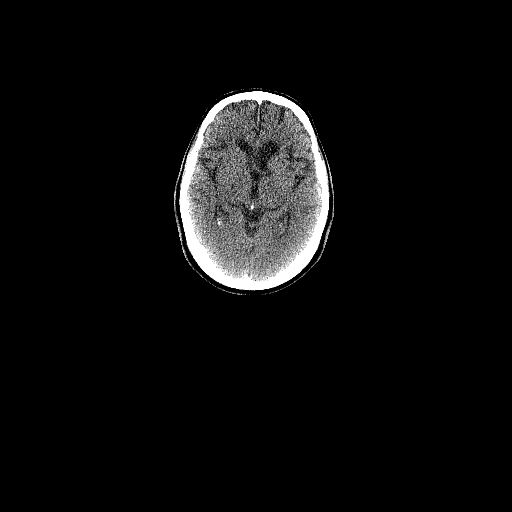

[1 of 25 positions shown; findings below may reference images not displayed]

FINDINGS: NECK

Left-sided mass centered at the aryepiglottic fold and piriform
sinus measures a S.U.V. max of 14.1, including on image 38/series 4.

Bilateral cervical nodal metastasis. A right-sided level IIa node
measures 2.0 cm and a S.U.V. max of 13.3 on image 34/series 4.

Left level 5 node measures 1.5 cm and a S.U.V. max of 8.9 on image
26/series 4.

Right-sided supraclavicular/ level 4 node measures 1.0 cm and a
S.U.V. max of 11.0 on image 41/ series 4.

CHEST

Bilateral upper lobe geographic pulmonary hypermetabolism
corresponds to dependent pulmonary opacities which are progressive
since 03/02/2016. No thoracic nodal hypermetabolism.

ABDOMEN/PELVIS

No abdominal pelvic nodal hypermetabolism. Note is made of
left-sided prostatic hypermetabolism which measures a S.U.V. max of
6.9.

SKELETON

Right-sided latissimus muscular activity is favored to be due to
motion after radiopharmaceutical injection. This measures a S.U.V.
max of 2.9, including on approximately image 51/series 4.

CT IMAGES PERFORMED FOR ATTENUATION CORRECTION

Bilateral carotid atherosclerosis. Mild cardiomegaly. Multivessel
coronary artery atherosclerosis. Moderate centrilobular and
paraseptal emphysema. Right upper lobe 4 mm pulmonary nodule on
image 71/ series 4 is unchanged.

A perifissural right upper lobe 9 mm pulmonary nodule on image
82/series 4. This is present back on 06/19/2009 per the 03/02/2016
report.

Right middle lobe 4 mm nodule on image 88/ series 4 is unchanged
since the most recent exam. Aortic stent graft repair. Mild
prostatomegaly. Right common iliac artery native sac aneurysm is
grossly similar.
IMPRESSION: 1. Left-sided primary centered at the piriform sinus and
aryepiglottic fold.
2. Bilateral cervical nodal metastasis.
3. No extracervical metastatic disease identified.
4. Bilateral pulmonary hypermetabolism is suspicious for developing
interstitial lung disease (likely nonspecific interstitial
pneumonia) superimposed upon centrilobular and paraseptal emphysema.
Infectious process is felt less likely.
5. Hypermetabolic focus in the left side of the prostate is
nonspecific. Consider correlation with PSA level and physical exam.
6. Right latissimus muscular hypermetabolism is likely incidental
but warrants followup attention.

## 2017-02-24 DEATH — deceased

## 2017-04-30 IMAGING — DX DG CHEST 2V
2 series · 2 of 2 positions shown · non-contrast
Comparison: One-view chest x-ray 06/27/2016

CLINICAL DATA: Chest pain and weakness. Generalized weakness
beginning 3 days ago. Productive cough with clear sputum and sore
throat. Personal history of laryngeal cancer.

EXAM:
CHEST  2 VIEW

[chest ap]
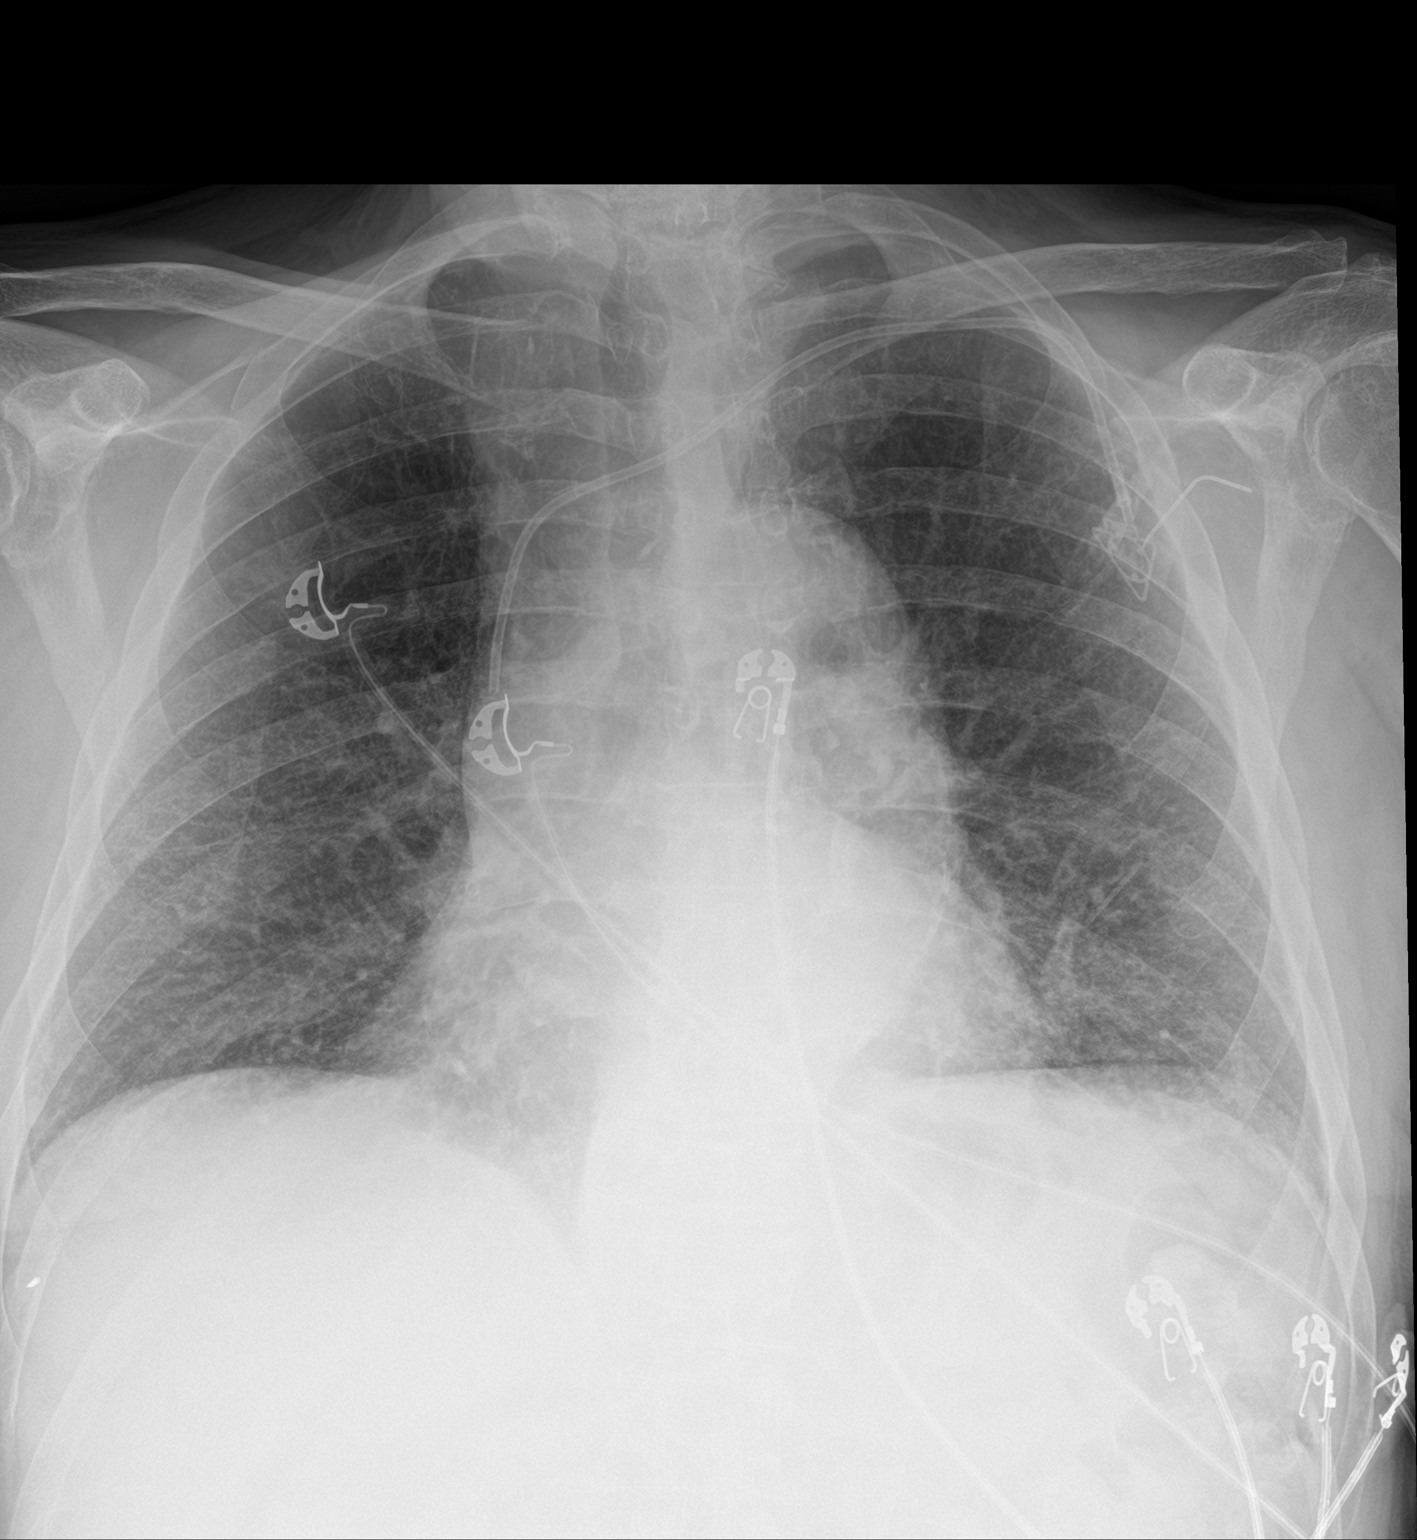

[chest lat]
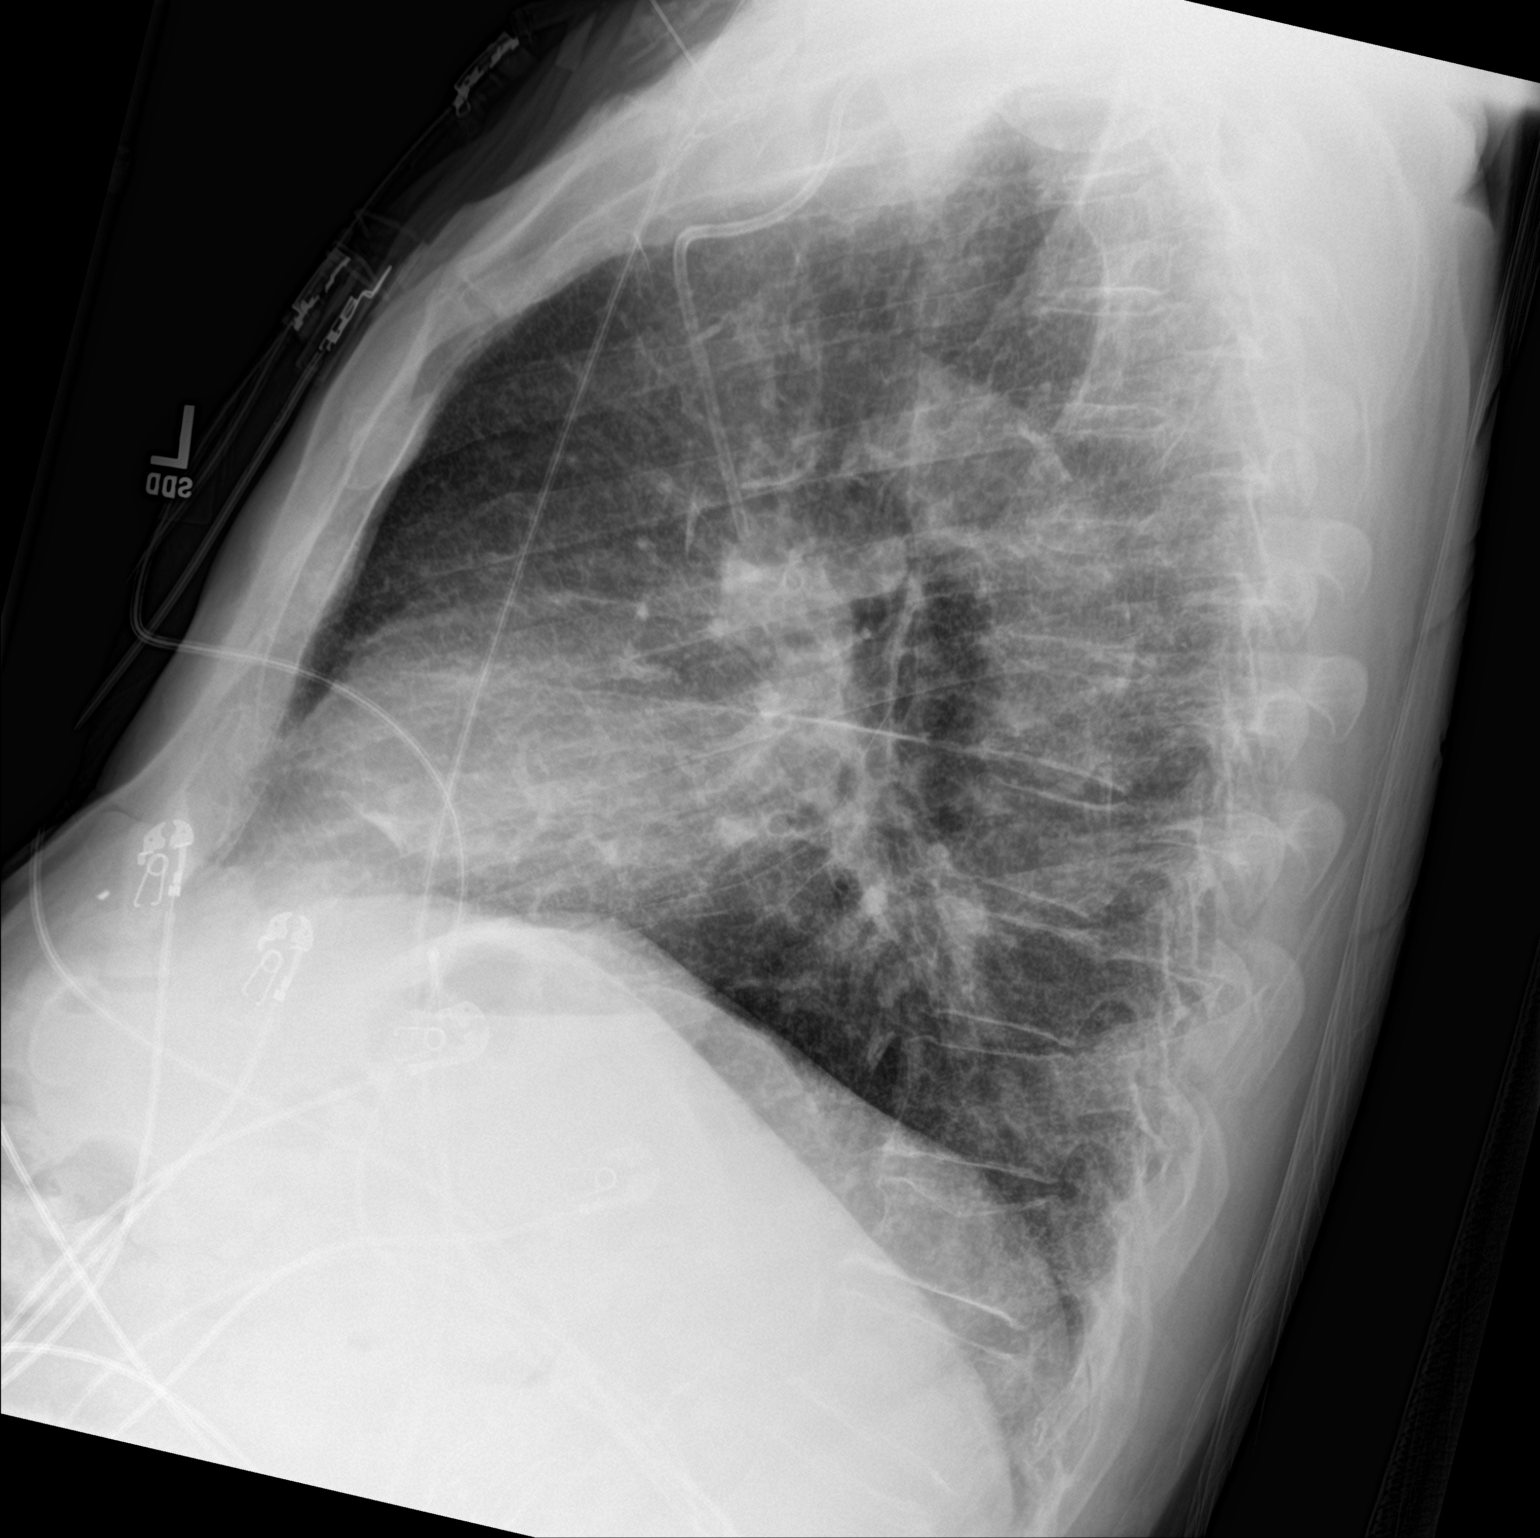

[2 of 2 positions shown; findings below may reference images not displayed]

FINDINGS: Last leads is exaggerated by low lung volumes. Atherosclerotic
calcifications are evident along the aorta. A left subclavian
Port-A-Cath is accessed. Diffuse interstitial coarsening is again
noted. Mild pulmonary vascular congestion is present.
IMPRESSION: 1. Borderline cardiomegaly with pulmonary vascular congestion and
probable mild edema.
2. Aortic atherosclerosis.
3. No focal airspace consolidation.
4. Left subclavian Port-A-Cath is accessed.

## 2017-10-23 IMAGING — US US RENAL
1 series · 13 of 25 positions shown · non-contrast
Comparison: 06/14/2016 PET CT.  03/02/2016 CT abdomen and pelvis.

ADDENDUM:
Repeat ultrasound was performed at the inferior pole of the LEFT
kidney.

Under real-time imaging and on obtained images, no evidence of LEFT
inferior pole exophytic lesion is identified.
The questioned abnormality on the earlier images corresponding to
lobulated inferior LEFT renal parenchyma. No LEFT renal mass
identified.
CLINICAL DATA: 70-year-old male with acute renal failure. Initial
encounter.
EXAM:
RENAL / URINARY TRACT ULTRASOUND COMPLETE

[Series 1: [facility] · 0.23mm/px · 13 of 64 slices shown]
[im 1/64]
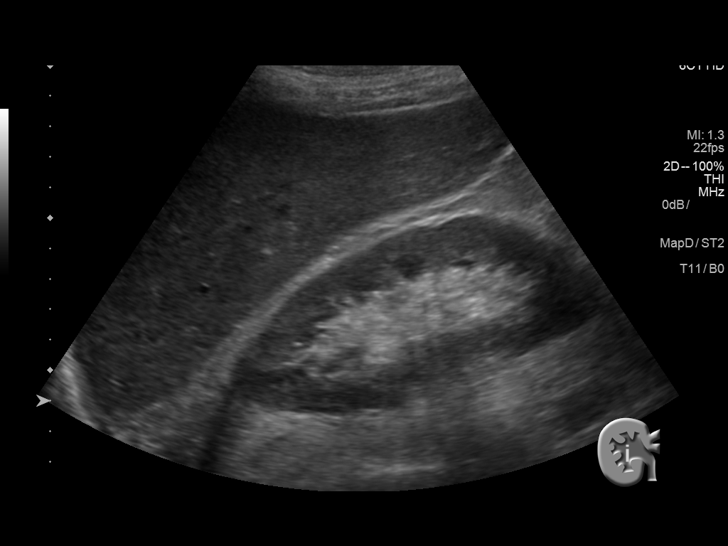
[im 6/64]
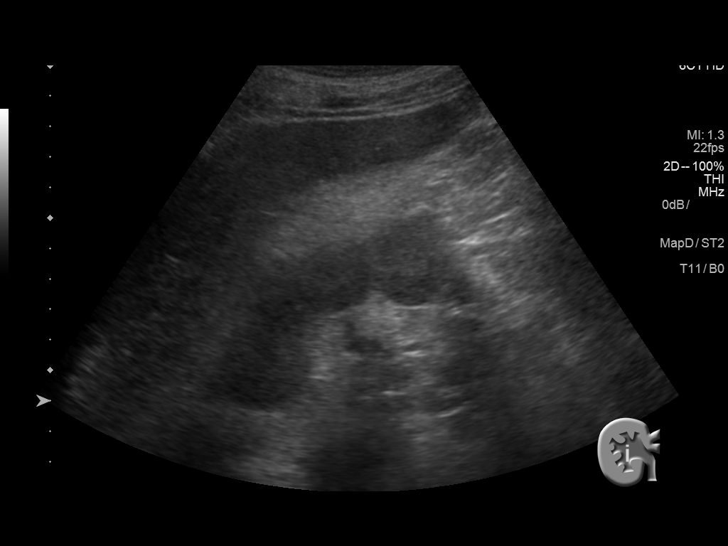
[im 11/64]
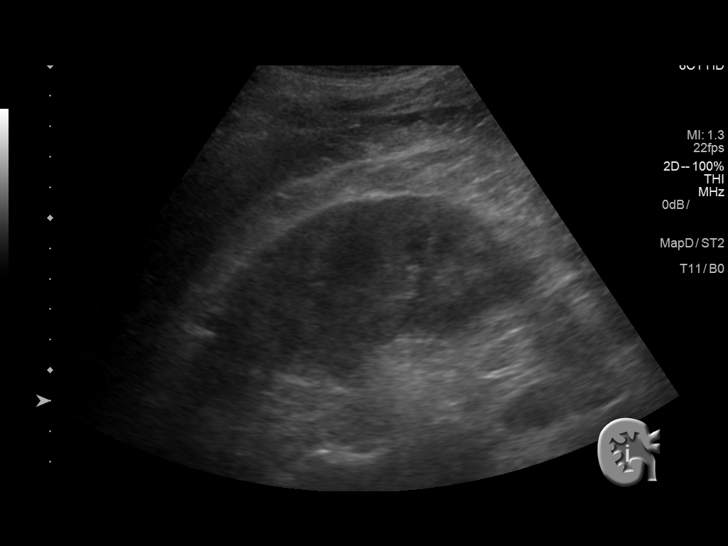
[im 16/64]
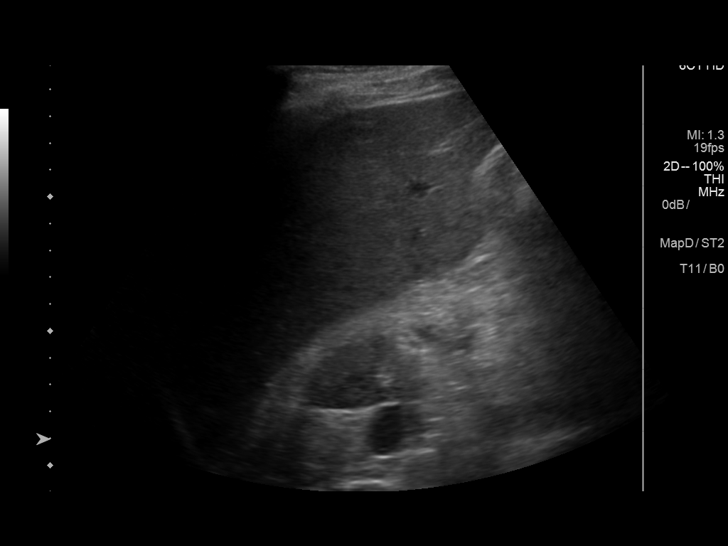
[im 22/64]
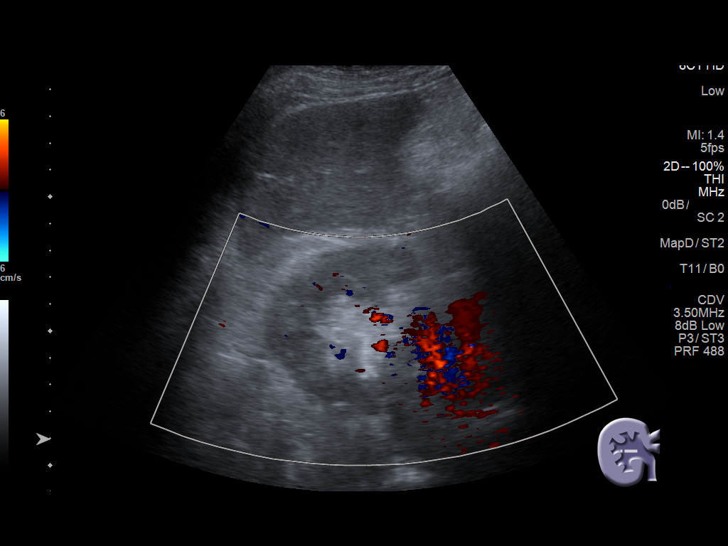
[im 27/64]
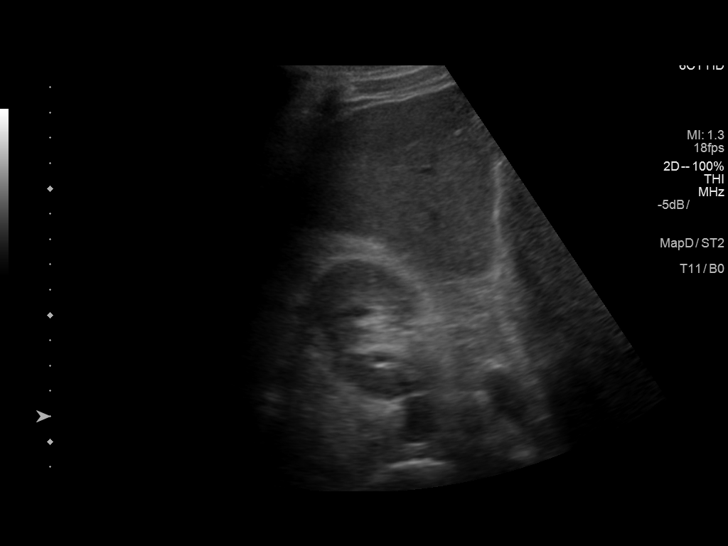
[im 32/64]
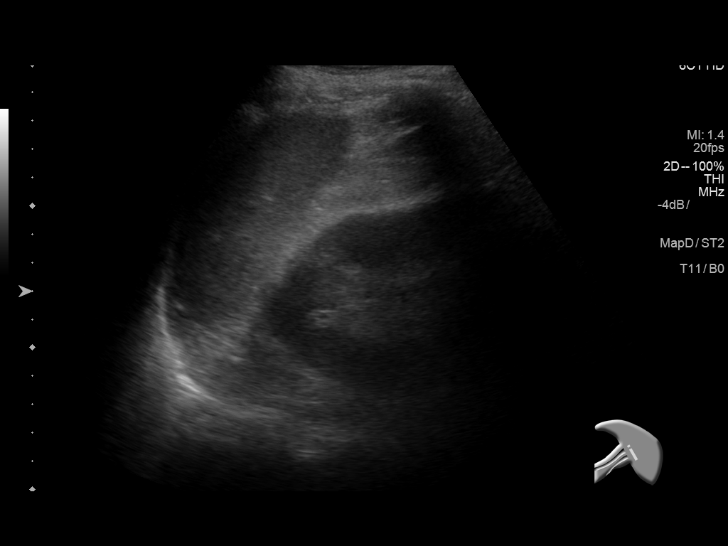
[im 37/64]
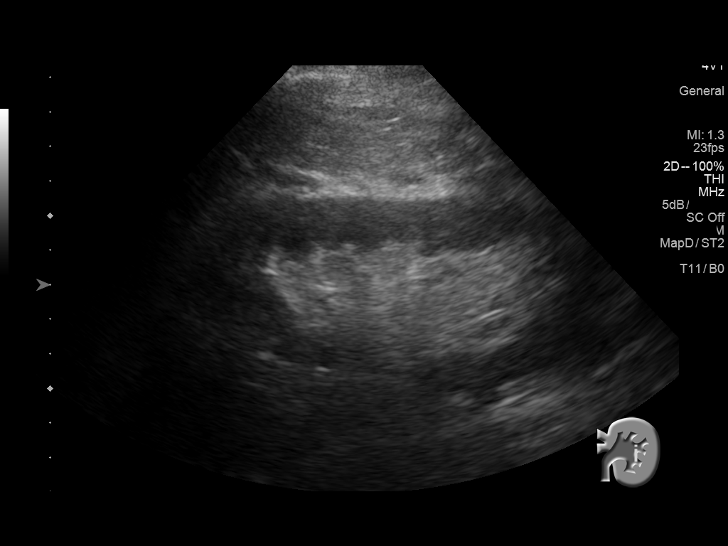
[im 43/64]
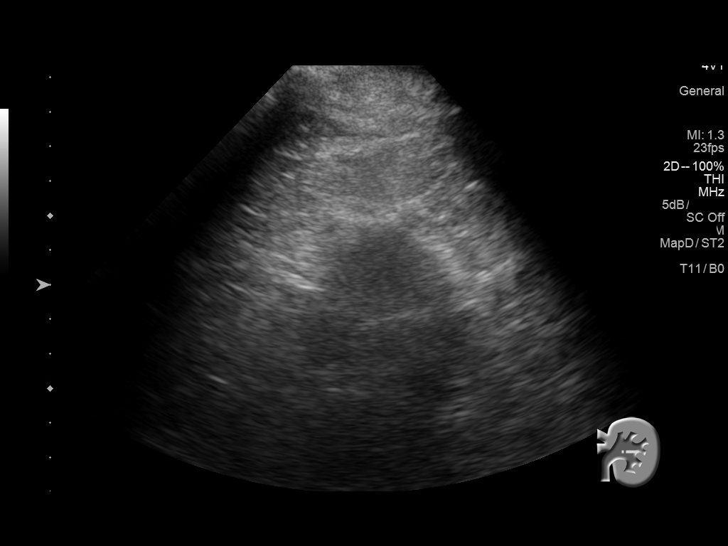
[im 48/64]
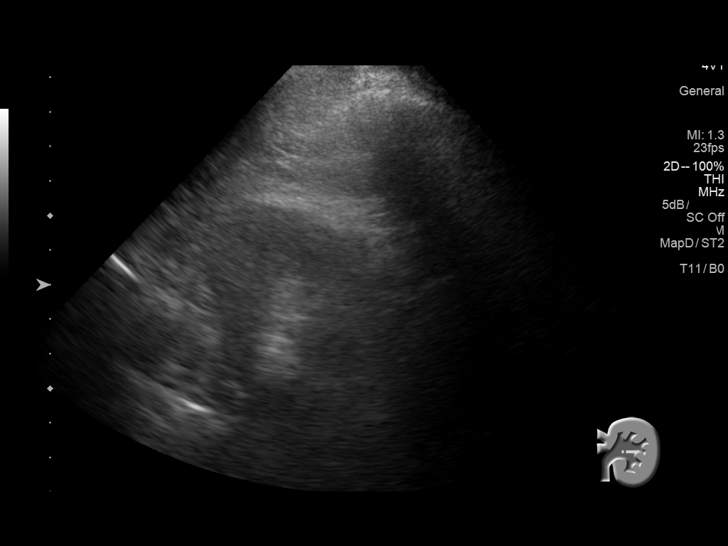
[im 53/64]
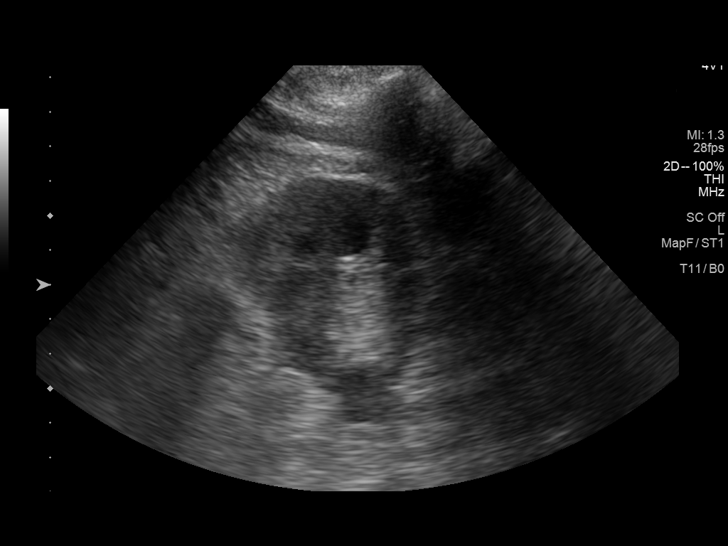
[im 58/64]
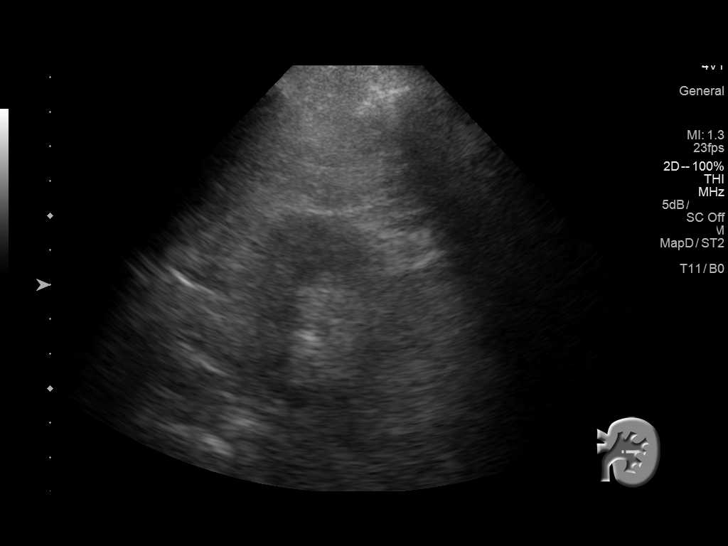
[im 64/64]
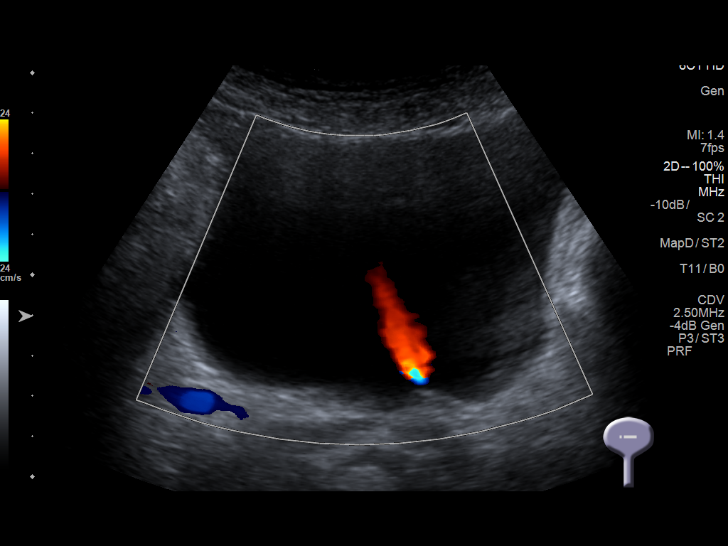

[13 of 25 positions shown; findings below may reference images not displayed]

FINDINGS: Right Kidney:

Length: 12.6 cm.  No hydronephrosis.  2.3 cm cyst.

Left Kidney:

Length: 12.9 cm.. No hydronephrosis. Sonographer raises possibility
of inferior medial 1.8 cm structure which cannot be confirmed as a
true lesion or a cyst. No FDG avid mass was noted in this region on
recent PET-CT. Patient will be recall for radiologist (Dr. Tahla)
directed ultrasound to determine if this represents an artifact
versus mass.

Bladder:

Appears normal for degree of bladder distention.
IMPRESSION: No hydronephrosis.

22.3 cm right renal cyst.

Sonographer raises possibility of inferior medial 1.8 cm structure
which cannot be confirmed as a true lesion or a cyst. No FDG avid
mass was noted in this region on recent PET-CT. Patient will be
recall for radiologist (Dr. Tahla) directed ultrasound to determine
if this represents an artifact versus mass.
